# Patient Record
Sex: Female | Born: 1940 | Race: White | Hispanic: Yes | Marital: Married | Smoking: Never smoker
Health system: Southern US, Community
[De-identification: ages and names within clinical notes are randomized; demographics above are authoritative.]

## PROBLEM LIST (undated history)

## (undated) DIAGNOSIS — E119 Type 2 diabetes mellitus without complications: Secondary | ICD-10-CM

## (undated) DIAGNOSIS — I5042 Chronic combined systolic (congestive) and diastolic (congestive) heart failure: Secondary | ICD-10-CM

## (undated) DIAGNOSIS — N289 Disorder of kidney and ureter, unspecified: Secondary | ICD-10-CM

## (undated) DIAGNOSIS — I10 Essential (primary) hypertension: Secondary | ICD-10-CM

## (undated) DIAGNOSIS — D638 Anemia in other chronic diseases classified elsewhere: Secondary | ICD-10-CM

## (undated) DIAGNOSIS — I509 Heart failure, unspecified: Secondary | ICD-10-CM

## (undated) DIAGNOSIS — N184 Chronic kidney disease, stage 4 (severe): Secondary | ICD-10-CM

## (undated) DIAGNOSIS — H547 Unspecified visual loss: Secondary | ICD-10-CM

## (undated) HISTORY — PX: CHOLECYSTECTOMY: SHX55

## (undated) HISTORY — DX: Unspecified visual loss: H54.7

---

## 2007-11-02 ENCOUNTER — Ambulatory Visit: Payer: Self-pay | Admitting: Family Medicine

## 2008-07-07 ENCOUNTER — Emergency Department: Payer: Self-pay | Admitting: Emergency Medicine

## 2009-01-29 ENCOUNTER — Ambulatory Visit: Payer: Self-pay | Admitting: Unknown Physician Specialty

## 2009-02-09 ENCOUNTER — Ambulatory Visit: Payer: Self-pay | Admitting: Unknown Physician Specialty

## 2009-08-16 ENCOUNTER — Ambulatory Visit: Payer: Self-pay | Admitting: Family Medicine

## 2009-10-18 ENCOUNTER — Emergency Department: Payer: Self-pay | Admitting: Emergency Medicine

## 2009-10-18 ENCOUNTER — Other Ambulatory Visit: Payer: Self-pay | Admitting: Ophthalmology

## 2010-09-07 ENCOUNTER — Emergency Department: Payer: Self-pay | Admitting: Emergency Medicine

## 2010-10-07 ENCOUNTER — Ambulatory Visit: Payer: Self-pay | Admitting: Family Medicine

## 2012-01-26 ENCOUNTER — Ambulatory Visit: Payer: Self-pay | Admitting: Family Medicine

## 2012-08-30 ENCOUNTER — Ambulatory Visit: Payer: Self-pay | Admitting: Primary Care

## 2013-05-19 ENCOUNTER — Emergency Department: Payer: Self-pay | Admitting: Emergency Medicine

## 2013-05-19 LAB — BASIC METABOLIC PANEL
Anion Gap: 7 (ref 7–16)
BUN: 35 mg/dL — ABNORMAL HIGH (ref 7–18)
CHLORIDE: 111 mmol/L — AB (ref 98–107)
CREATININE: 1.57 mg/dL — AB (ref 0.60–1.30)
Calcium, Total: 9 mg/dL (ref 8.5–10.1)
Co2: 22 mmol/L (ref 21–32)
EGFR (African American): 38 — ABNORMAL LOW
GFR CALC NON AF AMER: 33 — AB
Glucose: 109 mg/dL — ABNORMAL HIGH (ref 65–99)
Osmolality: 288 (ref 275–301)
Potassium: 5.2 mmol/L — ABNORMAL HIGH (ref 3.5–5.1)
Sodium: 140 mmol/L (ref 136–145)

## 2013-05-19 LAB — CBC
HCT: 33 % — ABNORMAL LOW (ref 35.0–47.0)
HGB: 11 g/dL — ABNORMAL LOW (ref 12.0–16.0)
MCH: 31.9 pg (ref 26.0–34.0)
MCHC: 33.2 g/dL (ref 32.0–36.0)
MCV: 96 fL (ref 80–100)
PLATELETS: 257 10*3/uL (ref 150–440)
RBC: 3.44 10*6/uL — ABNORMAL LOW (ref 3.80–5.20)
RDW: 12.6 % (ref 11.5–14.5)
WBC: 7.2 10*3/uL (ref 3.6–11.0)

## 2013-05-19 LAB — TROPONIN I

## 2014-02-16 ENCOUNTER — Emergency Department: Payer: Self-pay | Admitting: Emergency Medicine

## 2015-10-15 ENCOUNTER — Emergency Department
Admission: EM | Admit: 2015-10-15 | Discharge: 2015-10-15 | Disposition: A | Discharge status: Home / Self Care | Payer: Medicare Other | Attending: Emergency Medicine | Admitting: Emergency Medicine

## 2015-10-15 ENCOUNTER — Encounter: Payer: Self-pay | Admitting: Emergency Medicine

## 2015-10-15 DIAGNOSIS — R21 Rash and other nonspecific skin eruption: Secondary | ICD-10-CM | POA: Diagnosis present

## 2015-10-15 DIAGNOSIS — E1122 Type 2 diabetes mellitus with diabetic chronic kidney disease: Secondary | ICD-10-CM | POA: Diagnosis not present

## 2015-10-15 DIAGNOSIS — N189 Chronic kidney disease, unspecified: Secondary | ICD-10-CM | POA: Diagnosis not present

## 2015-10-15 DIAGNOSIS — B353 Tinea pedis: Secondary | ICD-10-CM | POA: Diagnosis not present

## 2015-10-15 DIAGNOSIS — I129 Hypertensive chronic kidney disease with stage 1 through stage 4 chronic kidney disease, or unspecified chronic kidney disease: Secondary | ICD-10-CM | POA: Diagnosis not present

## 2015-10-15 HISTORY — DX: Disorder of kidney and ureter, unspecified: N28.9

## 2015-10-15 HISTORY — DX: Type 2 diabetes mellitus without complications: E11.9

## 2015-10-15 HISTORY — DX: Essential (primary) hypertension: I10

## 2015-10-15 MED ORDER — CLOTRIMAZOLE 1 % EX CREA: 1.0000 "application " | TOPICAL_CREAM | Freq: Two times a day (BID) | CUTANEOUS | 0 refills | Status: DC

## 2015-10-15 NOTE — ED Triage Notes (Signed)
Per interpreter pt with rash to bilateral feet for five mths. Has been seen for same and was given some cream with no relief. Sores noted to bilateral bottom of feet. Pt states is diabetic.

## 2015-10-15 NOTE — ED Notes (Addendum)
Pt family reports that she has "rashes" on the bottom of both feet - rash present for 4-5 months - the itching has gotten worse so she came to the er for eval - rashes is scaly mostly toward center of feet - pt request that her daughter interpret for her and declined hospital interpreter

## 2015-10-15 NOTE — ED Provider Notes (Signed)
[facility] Emergency Department Provider Note  Time seen: 11:49 AM  I have reviewed the triage vital signs and the nursing notes.   HISTORY  Chief Complaint Foot Ulcer    HPI Robin Rogers is a 75 y.o. female with a past medical history of diabetes, hypertension, CK D who presents to the emergency department with a foot rash. According to the patient for the past several months she has had a rash to her feet. She tried a nystatin cream but it did not work so she stopped using it. Sounds like she was not using it regularly. Patient states she has had increased itching of the bottom of her feet so she came into the emergency department today for evaluation. Denies any pain. Denies any fever or vomiting.  Past Medical History:  Diagnosis Date  . Diabetes mellitus without complication ([place])   . Hypertension   . Renal disorder     There are no active problems to display for this patient.   Past Surgical History:  Procedure Laterality Date  . CHOLECYSTECTOMY      Prior to Admission medications   Not on File    No Known Allergies  No family history on file.  Social History Social History  Substance Use Topics  . Smoking status: Never Smoker  . Smokeless tobacco: Never Used  . Alcohol use No    Review of Systems Constitutional: Negative for fever. Cardiovascular: Negative for chest pain. Respiratory: Negative for shortness of breath. Gastrointestinal: Negative for abdominal pain Skin: Rash/dry skin to bottom of feet and between toes. Neurological: Negative for headache 10-point ROS otherwise negative.  ____________________________________________   PHYSICAL EXAM:  VITAL SIGNS: ED Triage Vitals  Enc Vitals Group     BP 10/15/15 1000 (!) 156/53     Pulse Rate 10/15/15 1000 69     Resp 10/15/15 1000 18     Temp 10/15/15 1000 98.9 F (37.2 C)     Temp Source 10/15/15 1000 Oral     SpO2 10/15/15 1000 97 %     Weight 10/15/15 1001 180  lb (81.6 kg)     Height 10/15/15 1001 5\' 3"  (1.6 m)     Head Circumference --      Peak Flow --      Pain Score 10/15/15 1110 5     Pain Loc --      Pain Edu? --      Excl. in [place]? --     Constitutional: Alert and oriented. Well appearing and in no distress. Eyes: Normal exam ENT   Head: Normocephalic and atraumatic.   Mouth/Throat: Mucous membranes are moist. Cardiovascular: Normal rate, regular rhythm. No murmur Respiratory: Normal respiratory effort without tachypnea nor retractions. Breath sounds are clear  Gastrointestinal: Soft and nontender. No distention.   Musculoskeletal: Nontender with normal range of motion in all extremities. Patient has a scaly dry rash to the bottom of bilateral feet and between her toes bilaterally. With flaking skin. No erythema or edema. No weeping. Neurologic:  Normal speech and language. No gross focal neurologic deficit Skin:  Skin is warm, dry, rash as described above. Psychiatric: Mood and affect are normal.   ____________________________________________    INITIAL IMPRESSION / ASSESSMENT AND PLAN / ED COURSE  Pertinent labs & imaging results that were available during my care of the patient were reviewed by me and considered in my medical decision making (see chart for details).  Patient presents with a rash most consistent with  a fungal infection to bilateral plantar aspect of the feet. Patient states itching but denies any pain. No erythema or edema or weeping. Does not appear consistent with cellulitis. We will treat with a clotrimazole lotion, and have the patient follow up with dermatology.  ____________________________________________   FINAL CLINICAL IMPRESSION(S) / ED DIAGNOSES  Fungal rash    Harvest Dark, MD 10/15/15 1206

## 2015-10-30 MED ORDER — ACETAMINOPHEN 500 MG PO TABS
ORAL_TABLET | ORAL | Status: AC
  Filled 2015-10-30: qty 2

## 2016-11-14 ENCOUNTER — Other Ambulatory Visit: Payer: Self-pay | Admitting: Family Medicine

## 2016-11-14 DIAGNOSIS — Z1231 Encounter for screening mammogram for malignant neoplasm of breast: Secondary | ICD-10-CM

## 2016-11-14 DIAGNOSIS — Z13820 Encounter for screening for osteoporosis: Secondary | ICD-10-CM

## 2016-12-24 ENCOUNTER — Other Ambulatory Visit: Payer: Medicare Other

## 2017-03-01 ENCOUNTER — Emergency Department: Payer: Medicare Other

## 2017-03-01 ENCOUNTER — Other Ambulatory Visit: Payer: Self-pay

## 2017-03-01 ENCOUNTER — Encounter: Payer: Self-pay | Admitting: Emergency Medicine

## 2017-03-01 ENCOUNTER — Emergency Department
Admission: EM | Admit: 2017-03-01 | Discharge: 2017-03-01 | Disposition: A | Discharge status: Home / Self Care | Payer: Medicare Other | Attending: Emergency Medicine | Admitting: Emergency Medicine

## 2017-03-01 DIAGNOSIS — R1012 Left upper quadrant pain: Secondary | ICD-10-CM | POA: Diagnosis present

## 2017-03-01 DIAGNOSIS — E119 Type 2 diabetes mellitus without complications: Secondary | ICD-10-CM | POA: Diagnosis not present

## 2017-03-01 DIAGNOSIS — R0602 Shortness of breath: Secondary | ICD-10-CM | POA: Insufficient documentation

## 2017-03-01 DIAGNOSIS — I10 Essential (primary) hypertension: Secondary | ICD-10-CM | POA: Diagnosis not present

## 2017-03-01 DIAGNOSIS — R140 Abdominal distension (gaseous): Secondary | ICD-10-CM | POA: Insufficient documentation

## 2017-03-01 LAB — URINALYSIS, COMPLETE (UACMP) WITH MICROSCOPIC
BACTERIA UA: NONE SEEN
BILIRUBIN URINE: NEGATIVE
Glucose, UA: NEGATIVE mg/dL
Hgb urine dipstick: NEGATIVE
Ketones, ur: NEGATIVE mg/dL
LEUKOCYTES UA: NEGATIVE
Nitrite: NEGATIVE
PROTEIN: 100 mg/dL — AB
Specific Gravity, Urine: 1.008 (ref 1.005–1.030)
pH: 6 (ref 5.0–8.0)

## 2017-03-01 LAB — BASIC METABOLIC PANEL
ANION GAP: 9 (ref 5–15)
BUN: 33 mg/dL — ABNORMAL HIGH (ref 6–20)
CO2: 23 mmol/L (ref 22–32)
Calcium: 8.5 mg/dL — ABNORMAL LOW (ref 8.9–10.3)
Chloride: 106 mmol/L (ref 101–111)
Creatinine, Ser: 2.15 mg/dL — ABNORMAL HIGH (ref 0.44–1.00)
GFR calc Af Amer: 25 mL/min — ABNORMAL LOW (ref >60)
GFR, EST NON AFRICAN AMERICAN: 21 mL/min — AB (ref >60)
GLUCOSE: 140 mg/dL — AB (ref 65–99)
POTASSIUM: 4.6 mmol/L (ref 3.5–5.1)
Sodium: 138 mmol/L (ref 135–145)

## 2017-03-01 LAB — CBC
HEMATOCRIT: 28.6 % — AB (ref 35.0–47.0)
HEMOGLOBIN: 9.5 g/dL — AB (ref 12.0–16.0)
MCH: 31.7 pg (ref 26.0–34.0)
MCHC: 33.3 g/dL (ref 32.0–36.0)
MCV: 95.2 fL (ref 80.0–100.0)
Platelets: 273 10*3/uL (ref 150–440)
RBC: 3.01 MIL/uL — ABNORMAL LOW (ref 3.80–5.20)
RDW: 13.7 % (ref 11.5–14.5)
WBC: 7.7 10*3/uL (ref 3.6–11.0)

## 2017-03-01 NOTE — ED Notes (Signed)
Patient transported to CT 

## 2017-03-01 NOTE — Discharge Instructions (Signed)
You have been seen in the emergency department for abdominal pain.  Your workup including lab work and a CT scan of your abdomen showed no acute abnormalities.  Please follow-up with your primary care doctor in the next several days for recheck/reevaluation and possible referral to a GI medicine physician if you continue to have symptoms.  Return to the emergency department for any worsening pain, or any other symptom personally concerning to yourself.

## 2017-03-01 NOTE — ED Notes (Signed)
Discharge given - pt already dressed and standing waiting to leave. No longer on monitor.

## 2017-03-01 NOTE — ED Provider Notes (Signed)
[facility] Emergency Department Provider Note  Time seen: 6:58 PM  I have reviewed the triage vital signs and the nursing notes. Spanish interpreter used for this evaluation  HISTORY  Chief Complaint Shortness of Breath; Abdominal Pain; and Bloated    HPI Robin Rogers is a 77 y.o. female with a past medical history of diabetes, hypertension, CKD, presents to the emergency department for left-sided abdominal pain.  According to the patient for 1 year she has had some mild discomfort to her left abdomen, it has been much worse over the past 1 month and even more severe today which prompted today's emergency department visit.  Patient feels like there is something blocking her intestines on that side although denies any diarrhea, constipation, nausea or vomiting.  Patient denies any fever, cough or congestion.  Largely negative review of systems.  When asked what changed when she came to the emergency department today she states the pain increased.  Currently describes as moderate dull pain in the left side.  States it is worse at night especially when lying down, and occasionally causes her to feel short of breath when the pain gets severe.  Past Medical History:  Diagnosis Date  . Diabetes mellitus without complication ([place])   . Hypertension   . Renal disorder     There are no active problems to display for this patient.   Past Surgical History:  Procedure Laterality Date  . CHOLECYSTECTOMY      Prior to Admission medications   Medication Sig Start Date End Date Taking? Authorizing Provider  clotrimazole (LOTRIMIN) 1 % cream Apply 1 application topically 2 (two) times daily. 10/15/15   Harvest Dark, MD    No Known Allergies  History reviewed. No pertinent family history.  Social History Social History   Tobacco Use  . Smoking status: Never Smoker  . Smokeless tobacco: Never Used  Substance Use Topics  . Alcohol use: No  . Drug use: Not on  file    Review of Systems Constitutional: Negative for fever. Eyes: Negative for visual complaints ENT: Negative for recent illness/congestion Cardiovascular: Negative for chest pain. Respiratory: Negative for shortness of breath. Gastrointestinal: Left-sided abdominal pain.  Negative for nausea vomiting or diarrhea.  Negative constipation Genitourinary: Negative for dysuria. Musculoskeletal: Negative for musculoskeletal complaints Skin: Negative for skin complaints  Neurological: Negative for headache All other ROS negative  ____________________________________________   PHYSICAL EXAM:  VITAL SIGNS: ED Triage Vitals  Enc Vitals Group     BP 03/01/17 1621 (!) 157/57     Pulse Rate 03/01/17 1621 94     Resp 03/01/17 1621 20     Temp 03/01/17 1621 99 F (37.2 C)     Temp Source 03/01/17 1621 Oral     SpO2 03/01/17 1621 96 %     Weight 03/01/17 1624 180 lb (81.6 kg)     Height 03/01/17 1624 5\' 3"  (1.6 m)     Head Circumference --      Peak Flow --      Pain Score 03/01/17 1623 10     Pain Loc --      Pain Edu? --      Excl. in [place]? --    Constitutional: Alert and oriented. Well appearing and in no distress. Eyes: Normal exam ENT   Head: Normocephalic and atraumatic.   Mouth/Throat: Mucous membranes are moist. Cardiovascular: Normal rate, regular rhythm. No murmur Respiratory: Normal respiratory effort without tachypnea nor retractions. Breath sounds are clear  Gastrointestinal: Soft, moderate left mid abdominal tenderness to palpation.  No rebound or guarding.  No distention.  Abdomen otherwise benign. Musculoskeletal: Nontender with normal range of motion in all extremities.  Neurologic:  Normal speech and language. No gross focal neurologic deficits Skin:  Skin is warm, dry and intact.  Psychiatric: Mood and affect are normal.   ____________________________________________    EKG  EKG reviewed and interpreted by myself shows sinus rhythm at 94 bpm with a  narrow QRS, normal axis, normal intervals, nonspecific ST changes  ____________________________________________    RADIOLOGY  CT negative for acute abnormality  ____________________________________________   INITIAL IMPRESSION / ASSESSMENT AND PLAN / ED COURSE  Pertinent labs & imaging results that were available during my care of the patient were reviewed by me and considered in my medical decision making (see chart for details).  Patient presents to the emergency department with left-sided abdominal discomfort present for the past 1 year but worse over the past 1 month.  Differential would include colitis, diverticulitis, mass or tumor, urinary tract infection or pyelonephritis.  Patient's blood work is largely within normal limits with a normal white blood cell count.  Patient does have renal insufficiency, but it appears to be largely unchanged per record review.  Care everywhere shows a creatinine of 2.2 on 02/16/17.  Given the patient's ongoing discomfort will obtain a CT scan of the abdomen/pelvis to further evaluate and rule out intra-abdominal pathology.  Patient agreeable to this plan of care.  Urinalysis pending.  CT scan negative for acute abnormality.  Urinalysis pending.  If urinalysis is normal we will have the patient follow-up with her primary care doctor.  Urinalysis pending.  Patient care signed out to oncoming physician.  ____________________________________________   FINAL CLINICAL IMPRESSION(S) / ED DIAGNOSES  Left-sided abdominal pain    Harvest Dark, MD 03/01/17 2048

## 2017-03-01 NOTE — ED Triage Notes (Signed)
Pt reports that her LLQ has been hurting for "along time" She states that she feels that her abd feels bloated and she cant breath at night, but can during the day.

## 2017-07-20 ENCOUNTER — Other Ambulatory Visit: Payer: Self-pay

## 2017-07-20 ENCOUNTER — Emergency Department: Payer: Medicare Other

## 2017-07-20 ENCOUNTER — Encounter: Payer: Self-pay | Admitting: Emergency Medicine

## 2017-07-20 ENCOUNTER — Emergency Department
Admission: EM | Admit: 2017-07-20 | Discharge: 2017-07-21 | Discharge status: Against Medical Advice | Payer: Medicare Other | Attending: Emergency Medicine | Admitting: Emergency Medicine

## 2017-07-20 DIAGNOSIS — N189 Chronic kidney disease, unspecified: Secondary | ICD-10-CM

## 2017-07-20 DIAGNOSIS — Z79899 Other long term (current) drug therapy: Secondary | ICD-10-CM | POA: Diagnosis not present

## 2017-07-20 DIAGNOSIS — I129 Hypertensive chronic kidney disease with stage 1 through stage 4 chronic kidney disease, or unspecified chronic kidney disease: Secondary | ICD-10-CM | POA: Diagnosis not present

## 2017-07-20 DIAGNOSIS — R109 Unspecified abdominal pain: Secondary | ICD-10-CM | POA: Diagnosis present

## 2017-07-20 DIAGNOSIS — E1122 Type 2 diabetes mellitus with diabetic chronic kidney disease: Secondary | ICD-10-CM | POA: Diagnosis not present

## 2017-07-20 DIAGNOSIS — R1012 Left upper quadrant pain: Secondary | ICD-10-CM | POA: Diagnosis not present

## 2017-07-20 DIAGNOSIS — Z9049 Acquired absence of other specified parts of digestive tract: Secondary | ICD-10-CM | POA: Diagnosis not present

## 2017-07-20 LAB — COMPREHENSIVE METABOLIC PANEL
ALBUMIN: 3.5 g/dL (ref 3.5–5.0)
ALT: 14 U/L (ref 0–44)
ANION GAP: 9 (ref 5–15)
AST: 18 U/L (ref 15–41)
Alkaline Phosphatase: 127 U/L — ABNORMAL HIGH (ref 38–126)
BUN: 41 mg/dL — ABNORMAL HIGH (ref 8–23)
CHLORIDE: 105 mmol/L (ref 98–111)
CO2: 24 mmol/L (ref 22–32)
CREATININE: 2.26 mg/dL — AB (ref 0.44–1.00)
Calcium: 8.6 mg/dL — ABNORMAL LOW (ref 8.9–10.3)
GFR calc non Af Amer: 20 mL/min — ABNORMAL LOW (ref >60)
GFR, EST AFRICAN AMERICAN: 23 mL/min — AB (ref >60)
Glucose, Bld: 115 mg/dL — ABNORMAL HIGH (ref 70–99)
Potassium: 5.2 mmol/L — ABNORMAL HIGH (ref 3.5–5.1)
SODIUM: 138 mmol/L (ref 135–145)
Total Bilirubin: 0.3 mg/dL (ref 0.3–1.2)
Total Protein: 7.7 g/dL (ref 6.5–8.1)

## 2017-07-20 LAB — URINALYSIS, COMPLETE (UACMP) WITH MICROSCOPIC
Bilirubin Urine: NEGATIVE
GLUCOSE, UA: NEGATIVE mg/dL
Hgb urine dipstick: NEGATIVE
KETONES UR: NEGATIVE mg/dL
Leukocytes, UA: NEGATIVE
Nitrite: NEGATIVE
PH: 6 (ref 5.0–8.0)
Protein, ur: 100 mg/dL — AB
Specific Gravity, Urine: 1.006 (ref 1.005–1.030)

## 2017-07-20 LAB — CBC
HCT: 28.6 % — ABNORMAL LOW (ref 35.0–47.0)
HEMOGLOBIN: 9.7 g/dL — AB (ref 12.0–16.0)
MCH: 31.7 pg (ref 26.0–34.0)
MCHC: 33.8 g/dL (ref 32.0–36.0)
MCV: 93.9 fL (ref 80.0–100.0)
PLATELETS: 286 10*3/uL (ref 150–440)
RBC: 3.05 MIL/uL — AB (ref 3.80–5.20)
RDW: 13.7 % (ref 11.5–14.5)
WBC: 9 10*3/uL (ref 3.6–11.0)

## 2017-07-20 LAB — LIPASE, BLOOD: LIPASE: 29 U/L (ref 11–51)

## 2017-07-20 MED ORDER — SODIUM CHLORIDE 0.9 % IV BOLUS: 1000.0000 mL | Freq: Once | INTRAVENOUS | Status: DC

## 2017-07-20 NOTE — ED Triage Notes (Signed)
abd pain and no BM x 3 days.

## 2017-07-20 NOTE — ED Notes (Signed)
Upon going in to start IV fluids, pt and family express their need to get home, states they wish to follow up w/ Nephrologist rather than staying for fluids.  EDP made aware.  AMA paperwork explained and signed.

## 2017-07-20 NOTE — ED Notes (Signed)
Patient transported to CT 

## 2017-07-20 NOTE — ED Notes (Signed)
Translator requested.

## 2017-07-20 NOTE — ED Provider Notes (Signed)
[facility] Emergency Department Provider Note  ___________________________________________   None    (approximate)  I have reviewed the triage vital signs and the nursing notes.   HISTORY  Chief Complaint Abdominal Pain   HPI Robin Rogers is a 77 y.o. female who presents to the emergency department for treatment and evaluation of abdominal pain.  Patient has not had a bowel movement for the past 3 days, but states while she was waiting to come back for evaluation she did have a bowel movement and now feels better.  She has a history of type 2 diabetes, chronic renal failure, and hypertension.  No alleviating measures were attempted prior to arrival.  Past Medical History:  Diagnosis Date  . Diabetes mellitus without complication (Robin Rogers)   . Hypertension   . Renal disorder     There are no active problems to display for this patient.   Past Surgical History:  Procedure Laterality Date  . CHOLECYSTECTOMY      Prior to Admission medications   Medication Sig Start Date End Date Taking? Authorizing Provider  amLODipine (NORVASC) 10 MG tablet Take 1 tablet by mouth daily. 02/27/17   [provider]  clotrimazole (LOTRIMIN) 1 % cream Apply 1 application topically 2 (two) times daily. Patient not taking: Reported on 03/01/2017 10/15/15   Harvest Dark, MD  lovastatin (MEVACOR) 40 MG tablet Take 1 tablet by mouth daily. 01/21/17   [provider]  metoprolol tartrate (LOPRESSOR) 25 MG tablet Take 1 tablet by mouth 2 (two) times daily. 12/02/16   [provider]  Vitamin D, Ergocalciferol, (DRISDOL) 50000 units CAPS capsule Take 50,000 Units by mouth every 7 (seven) days.    [provider]    Allergies Patient has no known allergies.  No family history on file.  Social History Social History   Tobacco Use  . Smoking status: Never Smoker  . Smokeless tobacco: Never Used  Substance Use Topics  . Alcohol use:  No  . Drug use: Not on file    Review of Systems  Constitutional: No fever/chills Eyes: No visual changes. ENT: No sore throat. Cardiovascular: Denies chest pain. Respiratory: Denies shortness of breath. Gastrointestinal: Positive for crampy abdominal pain.  No nausea, no vomiting.  No diarrhea.  Positive for constipation. Genitourinary: Negative for dysuria. Musculoskeletal: Negative for back pain. Skin: Negative for rash. Neurological: Negative for headaches, focal weakness or numbness. ____________________________________________   PHYSICAL EXAM:  VITAL SIGNS: ED Triage Vitals  Enc Vitals Group     BP 07/20/17 1804 (!) 144/52     Pulse Rate 07/20/17 1804 76     Resp 07/20/17 1804 18     Temp 07/20/17 1804 99.1 F (37.3 C)     Temp Source 07/20/17 1804 Oral     SpO2 07/20/17 1804 99 %     Weight 07/20/17 1806 170 lb (77.1 kg)     Height 07/20/17 1806 5\' 2"  (1.575 m)     Head Circumference --      Peak Flow --      Pain Score 07/20/17 1806 10     Pain Loc --      Pain Edu? --      Excl. in [place]? --     Constitutional: Alert and oriented. Well appearing and in no acute distress. Eyes: Conjunctivae are baseline per daughter. Head: Atraumatic. Nose: No congestion/rhinnorhea. Mouth/Throat: Mucous membranes are moist.  Neck: No stridor.   Cardiovascular: Normal rate, regular rhythm. Grossly normal  heart sounds.  Good peripheral circulation. Respiratory: Normal respiratory effort.  No retractions. Lungs CTAB. Gastrointestinal: Soft and diffusely tender. No distention. No abdominal bruits. No CVA tenderness.  Bowel sounds present x4 quadrants. Musculoskeletal: No lower extremity tenderness nor edema.  No joint effusions. Neurologic:  Normal speech and language. No gross focal neurologic deficits are appreciated. No gait instability. Skin:  Skin is warm, dry and intact. No rash noted. Psychiatric: Mood and affect are normal. Speech and behavior are  normal.  ____________________________________________   LABS (all labs ordered are listed, but only abnormal results are displayed)  Labs Reviewed  COMPREHENSIVE METABOLIC PANEL - Abnormal; Notable for the following components:      Result Value   Potassium 5.2 (*)    Glucose, Bld 115 (*)    BUN 41 (*)    Creatinine, Ser 2.26 (*)    Calcium 8.6 (*)    Alkaline Phosphatase 127 (*)    GFR calc non Af Amer 20 (*)    GFR calc Af Amer 23 (*)    All other components within normal limits  CBC - Abnormal; Notable for the following components:   RBC 3.05 (*)    Hemoglobin 9.7 (*)    HCT 28.6 (*)    All other components within normal limits  URINALYSIS, COMPLETE (UACMP) WITH MICROSCOPIC - Abnormal; Notable for the following components:   Color, Urine STRAW (*)    APPearance CLEAR (*)    Protein, ur 100 (*)    Bacteria, UA RARE (*)    All other components within normal limits  LIPASE, BLOOD   ____________________________________________  EKG   ____________________________________________  RADIOLOGY  ED MD interpretation: Borderline prominence of stool in the colon.   Official radiology report(s): Ct Abdomen Pelvis Wo Contrast  Result Date: 07/20/2017 CLINICAL DATA:  Abdominal pain and constipation for 3 days. EXAM: CT ABDOMEN AND PELVIS WITHOUT CONTRAST TECHNIQUE: Multidetector CT imaging of the abdomen and pelvis was performed following the standard protocol without IV contrast. COMPARISON:  03/01/2017 FINDINGS: Lower chest: Small calcified nodule in the right lower lobe, likely a granuloma. There is also a calcified pleural plaque in the right lower lobe. Moderate to prominent cardiomegaly. Lower thoracic aortic atherosclerotic calcification. Small type 1 hiatal hernia. Hepatobiliary: Cholecystectomy.  Otherwise unremarkable. Pancreas: Unremarkable Spleen: Unremarkable Adrenals/Urinary Tract: 0.6 by 0.4 cm parenchymal hyperdensity anteriorly in the left mid upper kidney, probably  a mildly complex cyst but technically nonspecific, best appreciated on image 63/5. No urinary tract calculi. Stomach/Bowel: Other redundant sigmoid colon. Borderline prominence of stool in the colon. No dilated small bowel. Appendix normal. Low position of the anorectal junction suggesting pelvic floor laxity. Vascular/Lymphatic: Aortoiliac atherosclerotic vascular disease. Reproductive: Unremarkable aside from faint parametrial calcifications along the margin of the uterus. Other: No supplemental non-categorized findings. Musculoskeletal: Lower lumbar spondylosis and degenerative disc disease leading to mild left foraminal stenosis at L5-S1. Mild levoconvex lumbar scoliosis. IMPRESSION: 1. Pelvic floor laxity with low position of the anorectal junction. 2. Borderline prominence of stool in the colon, potentially from mild constipation. 3. Calcified pleural plaque in the right lower lobe possibly from remote inflammation or old asbestos exposure. 4. Moderate to prominent cardiomegaly. 5.  Aortic Atherosclerosis (ICD10-I70.0). 6. Old granulomatous disease 7. Small type 1 hiatal hernia. 8. Mild left foraminal impingement at L5-S1. Electronically Signed   By: Van Clines M.D.   On: 07/20/2017 22:13    ____________________________________________   PROCEDURES  Procedure(s) performed: None  Procedures  Critical Care performed: No  ____________________________________________   INITIAL IMPRESSION / ASSESSMENT AND PLAN / ED COURSE  As part of my medical decision making, I reviewed the following data within the electronic MEDICAL RECORD NUMBER    77 year old female presenting to the emergency department for treatment and evaluation of abdominal pain.  While in the waiting room, she had a bowel movement which provided her with some relief of the pain.  I had requested that the patient stay for IV fluids as her electrolytes and renal function are mildly abnormal and felt that she would benefit greatly  from the rehydration, however she refused to stay despite being advised of the rationale.  She is to call and schedule an appointment with her nephrologist so she was advised to call tomorrow.  She is to return to the emergency department for symptoms of change or worsen if unable to schedule an appointment with primary care or her specialist.      ____________________________________________   FINAL CLINICAL IMPRESSION(S) / ED DIAGNOSES  Final diagnoses:  Left upper quadrant pain  Chronic kidney disease, unspecified CKD stage     ED Discharge Orders    None       Note:  This document was prepared using Dragon voice recognition software and may include unintentional dictation errors.    Victorino Dike, FNP 07/21/17 0203    Loney Hering, MD 07/21/17 [id number]

## 2017-09-22 ENCOUNTER — Encounter: Payer: Self-pay | Admitting: Emergency Medicine

## 2017-09-22 ENCOUNTER — Inpatient Hospital Stay
Admission: EM | Admit: 2017-09-22 | Discharge: 2017-09-25 | DRG: 391 | Disposition: A | Discharge status: Home / Self Care | Payer: Medicare Other | Attending: Internal Medicine | Admitting: Internal Medicine

## 2017-09-22 ENCOUNTER — Emergency Department: Payer: Medicare Other

## 2017-09-22 ENCOUNTER — Other Ambulatory Visit: Payer: Self-pay

## 2017-09-22 DIAGNOSIS — E875 Hyperkalemia: Secondary | ICD-10-CM | POA: Diagnosis not present

## 2017-09-22 DIAGNOSIS — K219 Gastro-esophageal reflux disease without esophagitis: Secondary | ICD-10-CM | POA: Diagnosis present

## 2017-09-22 DIAGNOSIS — I130 Hypertensive heart and chronic kidney disease with heart failure and stage 1 through stage 4 chronic kidney disease, or unspecified chronic kidney disease: Secondary | ICD-10-CM | POA: Diagnosis present

## 2017-09-22 DIAGNOSIS — K449 Diaphragmatic hernia without obstruction or gangrene: Secondary | ICD-10-CM | POA: Diagnosis present

## 2017-09-22 DIAGNOSIS — E1143 Type 2 diabetes mellitus with diabetic autonomic (poly)neuropathy: Secondary | ICD-10-CM | POA: Diagnosis present

## 2017-09-22 DIAGNOSIS — K529 Noninfective gastroenteritis and colitis, unspecified: Principal | ICD-10-CM | POA: Diagnosis present

## 2017-09-22 DIAGNOSIS — E1122 Type 2 diabetes mellitus with diabetic chronic kidney disease: Secondary | ICD-10-CM | POA: Diagnosis present

## 2017-09-22 DIAGNOSIS — K2970 Gastritis, unspecified, without bleeding: Secondary | ICD-10-CM | POA: Diagnosis present

## 2017-09-22 DIAGNOSIS — Z79899 Other long term (current) drug therapy: Secondary | ICD-10-CM

## 2017-09-22 DIAGNOSIS — Z7982 Long term (current) use of aspirin: Secondary | ICD-10-CM

## 2017-09-22 DIAGNOSIS — J9601 Acute respiratory failure with hypoxia: Secondary | ICD-10-CM | POA: Diagnosis not present

## 2017-09-22 DIAGNOSIS — N183 Chronic kidney disease, stage 3 (moderate): Secondary | ICD-10-CM | POA: Diagnosis present

## 2017-09-22 DIAGNOSIS — I509 Heart failure, unspecified: Secondary | ICD-10-CM

## 2017-09-22 DIAGNOSIS — R1013 Epigastric pain: Secondary | ICD-10-CM | POA: Diagnosis not present

## 2017-09-22 DIAGNOSIS — I5043 Acute on chronic combined systolic (congestive) and diastolic (congestive) heart failure: Secondary | ICD-10-CM | POA: Diagnosis not present

## 2017-09-22 DIAGNOSIS — R112 Nausea with vomiting, unspecified: Secondary | ICD-10-CM

## 2017-09-22 DIAGNOSIS — Z9049 Acquired absence of other specified parts of digestive tract: Secondary | ICD-10-CM

## 2017-09-22 DIAGNOSIS — D631 Anemia in chronic kidney disease: Secondary | ICD-10-CM | POA: Diagnosis present

## 2017-09-22 DIAGNOSIS — Z23 Encounter for immunization: Secondary | ICD-10-CM

## 2017-09-22 DIAGNOSIS — K5900 Constipation, unspecified: Secondary | ICD-10-CM | POA: Diagnosis present

## 2017-09-22 DIAGNOSIS — K3184 Gastroparesis: Secondary | ICD-10-CM | POA: Diagnosis present

## 2017-09-22 DIAGNOSIS — H548 Legal blindness, as defined in USA: Secondary | ICD-10-CM | POA: Diagnosis present

## 2017-09-22 DIAGNOSIS — R109 Unspecified abdominal pain: Secondary | ICD-10-CM

## 2017-09-22 DIAGNOSIS — E785 Hyperlipidemia, unspecified: Secondary | ICD-10-CM | POA: Diagnosis present

## 2017-09-22 HISTORY — DX: Heart failure, unspecified: I50.9

## 2017-09-22 LAB — URINALYSIS, COMPLETE (UACMP) WITH MICROSCOPIC
Bacteria, UA: NONE SEEN
Bilirubin Urine: NEGATIVE
GLUCOSE, UA: NEGATIVE mg/dL
KETONES UR: NEGATIVE mg/dL
Leukocytes, UA: NEGATIVE
NITRITE: NEGATIVE
Protein, ur: 100 mg/dL — AB
Specific Gravity, Urine: 1.004 — ABNORMAL LOW (ref 1.005–1.030)
pH: 5 (ref 5.0–8.0)

## 2017-09-22 LAB — BASIC METABOLIC PANEL
ANION GAP: 12 (ref 5–15)
BUN: 44 mg/dL — ABNORMAL HIGH (ref 8–23)
CO2: 20 mmol/L — AB (ref 22–32)
Calcium: 8.5 mg/dL — ABNORMAL LOW (ref 8.9–10.3)
Chloride: 103 mmol/L (ref 98–111)
Creatinine, Ser: 2.13 mg/dL — ABNORMAL HIGH (ref 0.44–1.00)
GFR calc non Af Amer: 22 mL/min — ABNORMAL LOW (ref >60)
GFR, EST AFRICAN AMERICAN: 25 mL/min — AB (ref >60)
GLUCOSE: 99 mg/dL (ref 70–99)
Potassium: 4.9 mmol/L (ref 3.5–5.1)
Sodium: 135 mmol/L (ref 135–145)

## 2017-09-22 LAB — HEPATIC FUNCTION PANEL
ALT: 17 U/L (ref 0–44)
AST: 24 U/L (ref 15–41)
Albumin: 3.4 g/dL — ABNORMAL LOW (ref 3.5–5.0)
Alkaline Phosphatase: 107 U/L (ref 38–126)
TOTAL PROTEIN: 7.6 g/dL (ref 6.5–8.1)
Total Bilirubin: 0.4 mg/dL (ref 0.3–1.2)

## 2017-09-22 LAB — CBC
HCT: 25.6 % — ABNORMAL LOW (ref 35.0–47.0)
HEMOGLOBIN: 8.9 g/dL — AB (ref 12.0–16.0)
MCH: 33.3 pg (ref 26.0–34.0)
MCHC: 34.8 g/dL (ref 32.0–36.0)
MCV: 95.7 fL (ref 80.0–100.0)
PLATELETS: 277 10*3/uL (ref 150–440)
RBC: 2.67 MIL/uL — AB (ref 3.80–5.20)
RDW: 13.3 % (ref 11.5–14.5)
WBC: 6.6 10*3/uL (ref 3.6–11.0)

## 2017-09-22 LAB — BRAIN NATRIURETIC PEPTIDE: B Natriuretic Peptide: 457 pg/mL — ABNORMAL HIGH (ref 0.0–100.0)

## 2017-09-22 LAB — LIPASE, BLOOD: Lipase: 23 U/L (ref 11–51)

## 2017-09-22 LAB — TROPONIN I

## 2017-09-22 MED ORDER — FAMOTIDINE 20 MG PO TABS
20.0000 mg | ORAL_TABLET | Freq: Once | ORAL | Status: AC
  Administered 2017-09-22: 20 mg via ORAL
  Filled 2017-09-22: qty 1

## 2017-09-22 MED ORDER — METOPROLOL TARTRATE 25 MG PO TABS
25.0000 mg | ORAL_TABLET | Freq: Two times a day (BID) | ORAL | Status: DC
  Administered 2017-09-23 – 2017-09-25 (×5): 25 mg via ORAL
  Filled 2017-09-22 (×5): qty 1

## 2017-09-22 MED ORDER — SODIUM CHLORIDE 0.9 % IV BOLUS
500.0000 mL | Freq: Once | INTRAVENOUS | Status: AC
  Administered 2017-09-22: 500 mL via INTRAVENOUS

## 2017-09-22 MED ORDER — INSULIN ASPART 100 UNIT/ML ~~LOC~~ SOLN: 0.0000 [IU] | Freq: Every day | SUBCUTANEOUS | Status: DC

## 2017-09-22 MED ORDER — VITAMIN D (ERGOCALCIFEROL) 1.25 MG (50000 UNIT) PO CAPS
50000.0000 [IU] | ORAL_CAPSULE | ORAL | Status: DC
  Administered 2017-09-23: 09:00:00 50000 [IU] via ORAL
  Filled 2017-09-22: qty 1

## 2017-09-22 MED ORDER — FERROUS SULFATE 325 (65 FE) MG PO TABS
325.0000 mg | ORAL_TABLET | Freq: Two times a day (BID) | ORAL | Status: DC
  Administered 2017-09-23 – 2017-09-25 (×5): 325 mg via ORAL
  Filled 2017-09-22 (×5): qty 1

## 2017-09-22 MED ORDER — MORPHINE SULFATE (PF) 4 MG/ML IV SOLN
4.0000 mg | Freq: Once | INTRAVENOUS | Status: AC
  Administered 2017-09-22: 4 mg via INTRAVENOUS

## 2017-09-22 MED ORDER — ASPIRIN EC 81 MG PO TBEC
81.0000 mg | DELAYED_RELEASE_TABLET | Freq: Every day | ORAL | Status: DC
  Administered 2017-09-23 – 2017-09-25 (×3): 81 mg via ORAL
  Filled 2017-09-22 (×3): qty 1

## 2017-09-22 MED ORDER — ACETAMINOPHEN 650 MG RE SUPP: 650.0000 mg | Freq: Four times a day (QID) | RECTAL | Status: DC | PRN

## 2017-09-22 MED ORDER — ONDANSETRON HCL 4 MG/2ML IJ SOLN
4.0000 mg | Freq: Once | INTRAMUSCULAR | Status: AC
  Administered 2017-09-22: 4 mg via INTRAVENOUS

## 2017-09-22 MED ORDER — ACETAMINOPHEN 325 MG PO TABS
650.0000 mg | ORAL_TABLET | Freq: Four times a day (QID) | ORAL | Status: DC | PRN
  Administered 2017-09-23: 650 mg via ORAL
  Filled 2017-09-22: qty 2

## 2017-09-22 MED ORDER — MORPHINE SULFATE (PF) 4 MG/ML IV SOLN
4.0000 mg | Freq: Once | INTRAVENOUS | Status: AC
  Administered 2017-09-22: 4 mg via INTRAVENOUS
  Filled 2017-09-22: qty 1

## 2017-09-22 MED ORDER — PANTOPRAZOLE SODIUM 40 MG PO TBEC
40.0000 mg | DELAYED_RELEASE_TABLET | Freq: Every day | ORAL | Status: DC
  Administered 2017-09-23 – 2017-09-25 (×3): 40 mg via ORAL
  Filled 2017-09-22 (×3): qty 1

## 2017-09-22 MED ORDER — INSULIN ASPART 100 UNIT/ML ~~LOC~~ SOLN
0.0000 [IU] | Freq: Three times a day (TID) | SUBCUTANEOUS | Status: DC
  Administered 2017-09-24 – 2017-09-25 (×2): 1 [IU] via SUBCUTANEOUS
  Filled 2017-09-22 (×2): qty 1

## 2017-09-22 MED ORDER — AMLODIPINE BESYLATE 10 MG PO TABS
10.0000 mg | ORAL_TABLET | Freq: Every day | ORAL | Status: DC
  Administered 2017-09-23 – 2017-09-25 (×3): 10 mg via ORAL
  Filled 2017-09-22 (×3): qty 1

## 2017-09-22 MED ORDER — PRAVASTATIN SODIUM 20 MG PO TABS
40.0000 mg | ORAL_TABLET | Freq: Every day | ORAL | Status: DC
  Administered 2017-09-23 – 2017-09-24 (×2): 40 mg via ORAL
  Filled 2017-09-22 (×2): qty 2

## 2017-09-22 MED ORDER — ONDANSETRON HCL 4 MG/2ML IJ SOLN
INTRAMUSCULAR | Status: AC
  Filled 2017-09-22: qty 2

## 2017-09-22 MED ORDER — SODIUM CHLORIDE 0.9 % IV SOLN
INTRAVENOUS | Status: DC
  Administered 2017-09-23 (×2): via INTRAVENOUS

## 2017-09-22 MED ORDER — ONDANSETRON HCL 4 MG/2ML IJ SOLN: 4.0000 mg | Freq: Four times a day (QID) | INTRAMUSCULAR | Status: DC | PRN

## 2017-09-22 MED ORDER — FAMOTIDINE IN NACL 20-0.9 MG/50ML-% IV SOLN
20.0000 mg | Freq: Two times a day (BID) | INTRAVENOUS | Status: DC
  Administered 2017-09-23 (×2): 20 mg via INTRAVENOUS
  Filled 2017-09-22 (×2): qty 50

## 2017-09-22 MED ORDER — HEPARIN SODIUM (PORCINE) 5000 UNIT/ML IJ SOLN
5000.0000 [IU] | Freq: Three times a day (TID) | INTRAMUSCULAR | Status: DC
  Administered 2017-09-23 – 2017-09-25 (×7): 5000 [IU] via SUBCUTANEOUS
  Filled 2017-09-22 (×7): qty 1

## 2017-09-22 MED ORDER — ONDANSETRON HCL 4 MG PO TABS: 4.0000 mg | ORAL_TABLET | Freq: Four times a day (QID) | ORAL | Status: DC | PRN

## 2017-09-22 MED ORDER — MORPHINE SULFATE (PF) 4 MG/ML IV SOLN
INTRAVENOUS | Status: AC
  Administered 2017-09-22: 4 mg via INTRAVENOUS
  Filled 2017-09-22: qty 1

## 2017-09-22 MED ORDER — ALUM & MAG HYDROXIDE-SIMETH 200-200-20 MG/5ML PO SUSP
15.0000 mL | Freq: Once | ORAL | Status: AC
  Administered 2017-09-22: 15 mL via ORAL
  Filled 2017-09-22: qty 30

## 2017-09-22 MED ORDER — POLYVINYL ALCOHOL 1.4 % OP SOLN
1.0000 [drp] | Freq: Four times a day (QID) | OPHTHALMIC | Status: DC
  Administered 2017-09-23 – 2017-09-25 (×9): 1 [drp] via OPHTHALMIC
  Filled 2017-09-22: qty 15

## 2017-09-22 MED ORDER — PNEUMOCOCCAL VAC POLYVALENT 25 MCG/0.5ML IJ INJ: 0.5000 mL | INJECTION | INTRAMUSCULAR | Status: DC

## 2017-09-22 MED ORDER — ONDANSETRON HCL 4 MG/2ML IJ SOLN
INTRAMUSCULAR | Status: AC
  Administered 2017-09-22: 4 mg via INTRAVENOUS
  Filled 2017-09-22: qty 2

## 2017-09-22 MED ORDER — METOCLOPRAMIDE HCL 5 MG/ML IJ SOLN
5.0000 mg | Freq: Four times a day (QID) | INTRAMUSCULAR | Status: DC
  Administered 2017-09-23 – 2017-09-25 (×10): 5 mg via INTRAVENOUS
  Filled 2017-09-22 (×10): qty 2

## 2017-09-22 MED ORDER — INFLUENZA VAC SPLIT HIGH-DOSE 0.5 ML IM SUSY
0.5000 mL | PREFILLED_SYRINGE | INTRAMUSCULAR | Status: DC
  Filled 2017-09-22: qty 0.5

## 2017-09-22 NOTE — H&P (Addendum)
[facility] [facility] NAME: Robin Rogers    MR#:  [id number]  DATE OF BIRTH:  1940-02-08  DATE OF ADMISSION:  09/22/2017  PRIMARY CARE PHYSICIAN: [facility] [place]   REQUESTING/REFERRING PHYSICIAN: Dr. Cherylann Banas  CHIEF COMPLAINT:   Chief Complaint  Patient presents with  . Chest Pain  . Shortness of Breath    HISTORY OF PRESENT ILLNESS:  Robin Rogers  is a 77 y.o. female with a known history of hypertension, diabetes, chronic kidney disease stage III who presents to the hospital due to abdominal pain.  Patient says she has been having abdominal pain now for the past few weeks but it was much worse today and she became short of breath with the pain and therefore was concerned and came to the ER for further evaluation.  In the ER patient received some IV morphine for the pain and then developed intractable nausea vomiting over the past few hours and has not been able to keep even crackers or ginger ale down.  Patient underwent ultrasound of her right upper quadrant along with a CT scan of the abdomen pelvis which is negative for acute pathology but continues to have intractable nausea vomiting and therefore hospitalist services were contacted for admission.  Patient denies any acute chest pain, fever, chills, cough, congestion, melena, hematochezia or any other associated symptoms presently.  PAST MEDICAL HISTORY:   Past Medical History:  Diagnosis Date  . Diabetes mellitus without complication ([place])   . Hypertension   . Renal disorder     PAST SURGICAL HISTORY:   Past Surgical History:  Procedure Laterality Date  . CHOLECYSTECTOMY      SOCIAL HISTORY:   Social History   Tobacco Use  . Smoking status: Never Smoker  . Smokeless tobacco: Never Used  Substance Use Topics  . Alcohol use: No    FAMILY HISTORY:  No family history on file.  DRUG ALLERGIES:  No Known Allergies  REVIEW OF SYSTEMS:   Review of  Systems  Constitutional: Negative for fever and weight loss.  HENT: Negative for congestion, nosebleeds and tinnitus.   Eyes: Negative for blurred vision, double vision and redness.  Respiratory: Negative for cough, hemoptysis and shortness of breath.   Cardiovascular: Negative for chest pain, orthopnea, leg swelling and PND.  Gastrointestinal: Positive for abdominal pain, nausea and vomiting. Negative for diarrhea and melena.  Genitourinary: Negative for dysuria, hematuria and urgency.  Musculoskeletal: Negative for falls and joint pain.  Neurological: Negative for dizziness, tingling, sensory change, focal weakness, seizures, weakness and headaches.  Endo/Heme/Allergies: Negative for polydipsia. Does not bruise/bleed easily.  Psychiatric/Behavioral: Negative for depression and memory loss. The patient is not nervous/anxious.     MEDICATIONS AT HOME:   Prior to Admission medications   Medication Sig Start Date End Date Taking? Authorizing Provider  amLODipine (NORVASC) 10 MG tablet Take 1 tablet by mouth daily. 02/27/17   [provider]  clotrimazole (LOTRIMIN) 1 % cream Apply 1 application topically 2 (two) times daily. Patient not taking: Reported on 03/01/2017 10/15/15   Harvest Dark, MD  lovastatin (MEVACOR) 40 MG tablet Take 1 tablet by mouth daily. 01/21/17   [provider]  metoprolol tartrate (LOPRESSOR) 25 MG tablet Take 1 tablet by mouth 2 (two) times daily. 12/02/16   [provider]  Vitamin D, Ergocalciferol, (DRISDOL) 50000 units CAPS capsule Take 50,000 Units by mouth every 7 (seven) days.    [provider]  VITAL SIGNS:  Blood pressure (!) 153/71, pulse 84, resp. rate 14, height 5' (1.524 m), weight 83 kg, SpO2 98 %.  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  EYES: legally blind b/l HEENT: Head atraumatic, normocephalic. Oropharynx and nasopharynx clear. No  oropharyngeal erythema, moist oral mucosa  NECK:  Supple, no jugular venous distention. No thyroid enlargement, no tenderness.  LUNGS: Normal breath sounds bilaterally, no wheezing, rales, rhonchi. No use of accessory muscles of respiration.  CARDIOVASCULAR: S1, S2 RRR. No murmurs, rubs, gallops, clicks.  ABDOMEN: Soft, Tender in Epigastric area, no rebound, rigidity, nondistended. Bowel sounds present. No organomegaly or mass.  EXTREMITIES: No pedal edema, cyanosis, or clubbing. + 2 pedal & radial pulses b/l.   NEUROLOGIC: Cranial nerves II through XII are intact. No focal Motor or sensory deficits appreciated b/l PSYCHIATRIC: The patient is alert and oriented x 3. SKIN: No obvious rash, lesion, or ulcer.   LABORATORY PANEL:   CBC Recent Labs  Lab 09/22/17 1122  WBC 6.6  HGB 8.9*  HCT 25.6*  PLT 277   ------------------------------------------------------------------------------------------------------------------  Chemistries  Recent Labs  Lab 09/22/17 1122  NA 135  K 4.9  CL 103  CO2 20*  GLUCOSE 99  BUN 44*  CREATININE 2.13*  CALCIUM 8.5*  AST 24  ALT 17  ALKPHOS 107  BILITOT 0.4   ------------------------------------------------------------------------------------------------------------------  Cardiac Enzymes Recent Labs  Lab 09/22/17 1122  TROPONINI <0.03   ------------------------------------------------------------------------------------------------------------------  RADIOLOGY:  Ct Abdomen Pelvis Wo Contrast  Result Date: 09/22/2017 CLINICAL DATA:  Acute upper abdominal pain. EXAM: CT ABDOMEN AND PELVIS WITHOUT CONTRAST TECHNIQUE: Multidetector CT imaging of the abdomen and pelvis was performed following the standard protocol without IV contrast. COMPARISON:  CT scan July 20, 2017. FINDINGS: Lower chest: No acute abnormality. Hepatobiliary: No focal liver abnormality is seen. Status post cholecystectomy. No biliary dilatation. Pancreas: Unremarkable. No  pancreatic ductal dilatation or surrounding inflammatory changes. Spleen: Normal in size without focal abnormality. Adrenals/Urinary Tract: Adrenal glands are unremarkable. Kidneys are normal, without renal calculi, focal lesion, or hydronephrosis. Bladder is unremarkable. Stomach/Bowel: Stomach is within normal limits. Appendix appears normal. No evidence of bowel wall thickening, distention, or inflammatory changes. Vascular/Lymphatic: Aortic atherosclerosis. No enlarged abdominal or pelvic lymph nodes. Reproductive: Uterus and bilateral adnexa are unremarkable. Other: No abdominal wall hernia or abnormality. No abdominopelvic ascites. Musculoskeletal: No acute or significant osseous findings. IMPRESSION: No acute abnormality seen in the abdomen or pelvis. Aortic Atherosclerosis (ICD10-I70.0). Electronically Signed   By: Marijo Conception, M.D.   On: 09/22/2017 13:02   Dg Chest 2 View  Result Date: 09/22/2017 CLINICAL DATA:  Chest pain EXAM: CHEST - 2 VIEW COMPARISON:  05/19/2013 FINDINGS: Cardiomegaly with vascular congestion and possible early interstitial edema. Right base atelectasis and small right effusion. No acute bony abnormality. IMPRESSION: Cardiomegaly with vascular congestion and possible early interstitial edema. Right base atelectasis and small right effusion. Electronically Signed   By: Rolm Baptise M.D.   On: 09/22/2017 11:34   US Abdomen Limited Ruq  Result Date: 09/22/2017 CLINICAL DATA:  Abdominal pain EXAM: ULTRASOUND ABDOMEN LIMITED RIGHT UPPER QUADRANT COMPARISON:  CT abdomen and pelvis September 22, 2017 FINDINGS: Gallbladder: Surgically absent. Common bile duct: Diameter: 6 mm. No intrahepatic or extrahepatic biliary duct dilatation. Liver: No focal lesion identified. Within normal limits in parenchymal echogenicity. Portal vein is patent on color Doppler imaging with normal direction of blood flow towards the liver. Right kidney  appears echogenic. IMPRESSION: Gallbladder absent.  Echogenic right kidney. Question a degree of underlying medical renal disease. Study otherwise unremarkable. Electronically Signed   By: Lowella Grip III M.D.   On: 09/22/2017 13:46     IMPRESSION AND PLAN:   77 year old female with past medical history of diabetes, chronic kidney disease stage III, hypertension who presents to the hospital due to abdominal pain and also noted to have intractable nausea vomiting.  1.  Abdominal pain/intractable nausea vomiting-etiology unclear but suspected to be secondary to underlying gastritis/diabetic gastroparesis.  Patient had an extensive work-up in the ER including CT abdomen pelvis, right upper quadrant ultrasound which shows no acute pathology. - We will observe overnight in the hospital, supportive care with IV fluids, antiemetics, pain control.  I will place her on some Reglan scheduled, IV Pepcid, oral Protonix.  Follow clinically. -If not improving consider gastroenterology consult.  2.  Essential hypertension-continue Norvasc, metoprolol.  3.  Diabetes type 2 without complication-patient currently is not taking any meds.  I will check a hemoglobin A1c, place her on sliding scale insulin.  4.  Hyperlipidemia-continue lovastatin.  5.  GERD-continue Protonix, Pepcid.  6. 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 -Hemoglobin stable.  Continue iron supplements.  7.  Chronic kidney disease stage III-patient's creatinine is close to baseline we will continue to monitor.  All the records are reviewed and case discussed with ED provider. Management plans discussed with the patient, family and they are in agreement.  CODE STATUS: Full code  TOTAL TIME TAKING CARE OF THIS PATIENT: 40 minutes.    Henreitta Leber M.D on 09/22/2017 at 7:32 PM  Between 7am to 6pm - Pager - [phone]  After 6pm go to [email or web address] - password [email or web address] [facility]  Office  [phone]  CC: Primary care physician; [facility]  [place]

## 2017-09-22 NOTE — ED Notes (Signed)
Patient transported to CT 

## 2017-09-22 NOTE — ED Triage Notes (Signed)
Per interpreter pt reports difficulty breathing, shortness of breath and pain to her chest and pit of her stomach for about a week. Pt reports the pain became unbearable last pm.

## 2017-09-22 NOTE — ED Provider Notes (Signed)
[facility] Emergency Department Provider Note ____________________________________________   First MD Initiated Contact with Patient 09/22/17 1210     (approximate)  I have reviewed the triage vital signs and the nursing notes.   HISTORY  Chief Complaint Chest Pain and Shortness of Breath    HPI Robin Rogers is a 77 y.o. female with PMH as noted below who presents with abdominal pain, primarily left-sided and in her upper abdomen for the last week, gradual onset, and worsening over the last day.  She reports associated shortness of breath and pain radiating up to her chest.  She denies nausea or vomiting, diarrhea or change in her bowel movements, or any urinary symptoms or fever.  She does report having similar pain previously.  Past Medical History:  Diagnosis Date  . Diabetes mellitus without complication ([place])   . Hypertension   . Renal disorder     There are no active problems to display for this patient.   Past Surgical History:  Procedure Laterality Date  . CHOLECYSTECTOMY      Prior to Admission medications   Medication Sig Start Date End Date Taking? Authorizing Provider  amLODipine (NORVASC) 10 MG tablet Take 1 tablet by mouth daily. 02/27/17   [provider]  clotrimazole (LOTRIMIN) 1 % cream Apply 1 application topically 2 (two) times daily. Patient not taking: Reported on 03/01/2017 10/15/15   Harvest Dark, MD  lovastatin (MEVACOR) 40 MG tablet Take 1 tablet by mouth daily. 01/21/17   [provider]  metoprolol tartrate (LOPRESSOR) 25 MG tablet Take 1 tablet by mouth 2 (two) times daily. 12/02/16   [provider]  Vitamin D, Ergocalciferol, (DRISDOL) 50000 units CAPS capsule Take 50,000 Units by mouth every 7 (seven) days.    [provider]    Allergies Patient has no known allergies.  No family history on file.  Social History Social History   Tobacco Use  . Smoking status: Never  Smoker  . Smokeless tobacco: Never Used  Substance Use Topics  . Alcohol use: No  . Drug use: Not on file    Review of Systems  Constitutional: No fever. Eyes: No redness. ENT: No sore throat. Cardiovascular: Positive for chest pain. Respiratory: Positive for shortness of breath. Gastrointestinal: No nausea, no vomiting.  No diarrhea.  Genitourinary: Negative for dysuria.  Musculoskeletal: Negative for back pain. Skin: Negative for rash. Neurological: Negative for headache.   ____________________________________________   PHYSICAL EXAM:  VITAL SIGNS: ED Triage Vitals [09/22/17 1114]  Enc Vitals Group     BP      Pulse      Resp      Temp      Temp src      SpO2      Weight 183 lb (83 kg)     Height 5' (1.524 m)     Head Circumference      Peak Flow      Pain Score 10     Pain Loc      Pain Edu?      Excl. in [place]?     Constitutional: Alert and oriented.  Slightly uncomfortable appearing but in no acute distress. Eyes: Conjunctivae are normal.  Head: Atraumatic. Nose: No congestion/rhinnorhea. Mouth/Throat: Mucous membranes are moist.   Neck: Normal range of motion.  Cardiovascular: Normal rate, regular rhythm. Grossly normal heart sounds.  Good peripheral circulation. Respiratory: Normal respiratory effort.  No retractions. Lungs CTAB. Gastrointestinal: Soft with mild epigastric and moderate  left mid and left lower quadrant tenderness. No distention.  Genitourinary: No flank tenderness. Musculoskeletal: No lower extremity edema.  Extremities warm and well perfused.  Neurologic:  Normal speech and language. No gross focal neurologic deficits are appreciated.  Skin:  Skin is warm and dry. No rash noted. Psychiatric: Mood and affect are normal. Speech and behavior are normal.  ____________________________________________   LABS (all labs ordered are listed, but only abnormal results are displayed)  Labs Reviewed  BASIC METABOLIC PANEL - Abnormal; Notable  for the following components:      Result Value   CO2 20 (*)    BUN 44 (*)    Creatinine, Ser 2.13 (*)    Calcium 8.5 (*)    GFR calc non Af Amer 22 (*)    GFR calc Af Amer 25 (*)    All other components within normal limits  CBC - Abnormal; Notable for the following components:   RBC 2.67 (*)    Hemoglobin 8.9 (*)    HCT 25.6 (*)    All other components within normal limits  HEPATIC FUNCTION PANEL - Abnormal; Notable for the following components:   Albumin 3.4 (*)    All other components within normal limits  BRAIN NATRIURETIC PEPTIDE - Abnormal; Notable for the following components:   B Natriuretic Peptide 457.0 (*)    All other components within normal limits  URINALYSIS, COMPLETE (UACMP) WITH MICROSCOPIC - Abnormal; Notable for the following components:   Color, Urine STRAW (*)    APPearance CLEAR (*)    Specific Gravity, Urine 1.004 (*)    Hgb urine dipstick SMALL (*)    Protein, ur 100 (*)    All other components within normal limits  TROPONIN I  LIPASE, BLOOD   ____________________________________________  EKG  ED ECG REPORT I, Arta Silence, the attending physician, personally viewed and interpreted this ECG.  Date: 09/22/2017 EKG Time: 1115 Rate: 96 Rhythm: normal sinus rhythm QRS Axis: normal Intervals: normal ST/T Wave abnormalities: normal Narrative Interpretation: no evidence of acute ischemia  ____________________________________________  RADIOLOGY  CXR: No focal infiltrate CT abdomen: No acute abnormality [place] RUQ: No acute findings ____________________________________________   PROCEDURES  Procedure(s) performed: No  Procedures  Critical Care performed: No ____________________________________________   INITIAL IMPRESSION / ASSESSMENT AND PLAN / ED COURSE  Pertinent labs & imaging results that were available during my care of the patient were reviewed by me and considered in my medical decision making (see chart for  details).  77 year old female with PMH as noted above including diabetes and kidney disease presents with upper and left-sided abdominal pain over the last week, with acute worsening over the last day associated with some shortness of breath and chest pain.  I reviewed the past medical records in Epic; the patient was seen in the ED in February of this year with left-sided abdominal pain and negative imaging at that time.  She states that the current pain is similar.  On exam, she is relatively well-appearing, her vital signs are normal, and the remainder of the exam is as described above.  Abdomen is soft with some epigastric and left-sided tenderness.  Differential includes diverticulitis, colitis, gastroenteritis, UTI, gastritis, or less likely hepatobiliary cause.  We will obtain CT abdomen, basic, hepatobiliary, and cardiac labs, give analgesia, and reassess.  ----------------------------------------- 5:17 PM on 09/22/2017 -----------------------------------------  The patient's work-up has been entirely negative.  When the CT was negative I added on a right upper quadrant ultrasound which also shows no  acute findings.  The patient is anemic and has elevated creatinine but these are chronic and unchanged.  Her vital signs are remained stable.  Her pain has improved after some analgesia.  Another family member has arrived who confirms that the patient's symptoms are recurrent and similar to prior ED presentations.  The patient still reported some upper abdominal discomfort and nausea so I ordered Pepcid and Maalox.  However, the patient then began vomiting.  We will give Zofran and observe a bit longer.  At this time there is no indication for admission so I anticipate discharge home but if the patient has persistent vomiting we may need to further observe her.  ----------------------------------------- 6:54 PM on 09/22/2017 -----------------------------------------  Patient appears  comfortable for a while but now is vomiting again despite being medicated.  Given that she has been in the ED for 8 hours and is still symptomatic, she will require further observation as an inpatient.  I signed the patient out to the hospitalist Dr. Verdell Carmine.  ____________________________________________   FINAL CLINICAL IMPRESSION(S) / ED DIAGNOSES  Final diagnoses:  Epigastric abdominal pain  Intractable vomiting with nausea, unspecified vomiting type      NEW MEDICATIONS STARTED DURING THIS VISIT:  New Prescriptions   No medications on file     Note:  This document was prepared using Dragon voice recognition software and may include unintentional dictation errors.    Arta Silence, MD 09/22/17 [phone]

## 2017-09-23 ENCOUNTER — Observation Stay: Payer: Medicare Other

## 2017-09-23 ENCOUNTER — Other Ambulatory Visit: Payer: Self-pay

## 2017-09-23 LAB — CBC
HEMATOCRIT: 23.3 % — AB (ref 35.0–47.0)
Hemoglobin: 8 g/dL — ABNORMAL LOW (ref 12.0–16.0)
MCH: 33.2 pg (ref 26.0–34.0)
MCHC: 34.3 g/dL (ref 32.0–36.0)
MCV: 96.8 fL (ref 80.0–100.0)
Platelets: 231 10*3/uL (ref 150–440)
RBC: 2.41 MIL/uL — ABNORMAL LOW (ref 3.80–5.20)
RDW: 13.5 % (ref 11.5–14.5)
WBC: 5.2 10*3/uL (ref 3.6–11.0)

## 2017-09-23 LAB — BASIC METABOLIC PANEL
Anion gap: 4 — ABNORMAL LOW (ref 5–15)
BUN: 47 mg/dL — AB (ref 8–23)
CHLORIDE: 107 mmol/L (ref 98–111)
CO2: 24 mmol/L (ref 22–32)
Calcium: 8.2 mg/dL — ABNORMAL LOW (ref 8.9–10.3)
Creatinine, Ser: 2.25 mg/dL — ABNORMAL HIGH (ref 0.44–1.00)
GFR calc Af Amer: 23 mL/min — ABNORMAL LOW (ref >60)
GFR calc non Af Amer: 20 mL/min — ABNORMAL LOW (ref >60)
GLUCOSE: 104 mg/dL — AB (ref 70–99)
Potassium: 5.7 mmol/L — ABNORMAL HIGH (ref 3.5–5.1)
SODIUM: 135 mmol/L (ref 135–145)

## 2017-09-23 LAB — GLUCOSE, CAPILLARY
GLUCOSE-CAPILLARY: 113 mg/dL — AB (ref 70–99)
Glucose-Capillary: 96 mg/dL (ref 70–99)
Glucose-Capillary: 95 mg/dL (ref 70–99)
Glucose-Capillary: 121 mg/dL — ABNORMAL HIGH (ref 70–99)

## 2017-09-23 MED ORDER — SODIUM POLYSTYRENE SULFONATE 15 GM/60ML PO SUSP
30.0000 g | Freq: Once | ORAL | Status: AC
  Administered 2017-09-23: 30 g via ORAL
  Filled 2017-09-23: qty 120

## 2017-09-23 MED ORDER — INSULIN ASPART 100 UNIT/ML IV SOLN
10.0000 [IU] | Freq: Once | INTRAVENOUS | Status: AC
  Administered 2017-09-23: 09:00:00 10 [IU] via INTRAVENOUS
  Filled 2017-09-23: qty 0.1

## 2017-09-23 MED ORDER — SENNA 8.6 MG PO TABS
1.0000 | ORAL_TABLET | Freq: Every day | ORAL | Status: DC
  Administered 2017-09-23 – 2017-09-25 (×3): 8.6 mg via ORAL
  Filled 2017-09-23 (×3): qty 1

## 2017-09-23 MED ORDER — DEXTROSE 50 % IV SOLN
25.0000 mL | Freq: Once | INTRAVENOUS | Status: AC
  Administered 2017-09-23: 09:00:00 25 mL via INTRAVENOUS
  Filled 2017-09-23: qty 50

## 2017-09-23 MED ORDER — AMOXICILLIN 500 MG PO CAPS: 500.0000 mg | ORAL_CAPSULE | Freq: Three times a day (TID) | ORAL | Status: DC

## 2017-09-23 MED ORDER — DOCUSATE SODIUM 100 MG PO CAPS
100.0000 mg | ORAL_CAPSULE | Freq: Two times a day (BID) | ORAL | Status: DC
  Administered 2017-09-23 – 2017-09-25 (×4): 100 mg via ORAL
  Filled 2017-09-23 (×4): qty 1

## 2017-09-23 MED ORDER — LORAZEPAM 0.5 MG PO TABS
0.5000 mg | ORAL_TABLET | Freq: Every evening | ORAL | Status: DC | PRN
  Administered 2017-09-23: 0.5 mg via ORAL
  Filled 2017-09-23: qty 1

## 2017-09-23 NOTE — Care Management Important Message (Signed)
Important Message  Patient Details  Name: Robin Rogers MRN: [id number] Date of Birth: 04/21/1940   Medicare Important Message Given:   Explained through interpreter [place]  given    Shelbie Ammons, RN 09/23/2017, 9:18 AM

## 2017-09-23 NOTE — Progress Notes (Signed)
Confirmed with Dr. Benjie Karvonen patient still receiving Dextrose and Insulin with BS 96.

## 2017-09-23 NOTE — Consult Note (Signed)
GI Inpatient Consult Note  Reason for Consult: Generalized abd pain, nausea, vomiting   Attending Requesting Consult: Dr. Bettey Costa, MD  History of Present Illness: Robin Rogers is a 77 y.o. female seen for evaluation of generalized abdominal pain, nausea, and vomiting at the request of Dr. Benjie Karvonen, MD. Patient has a PMH of diabetes, hypertension, CKD Stage III, legal blindness admitted to the hospital last night for generalized abdominal pain and intractable nausea and vomiting. Pt underwent RUQ [place] and CT abd/pelvis which were both negative for intra-abdominal pathology. LFTs normal. Prior hx of cholecystectomy. A1C pending.  Pt seen and examined this afternoon in hospital bed. Husband in room during exam. Interpretor present and helps with history. Robin Rogers reports she has had a 1-year hx of generalized abdominal pain that has worsened over the past 1-2 months. She reports pain is currently located in the LLQ and epigastric region. Pain is described as sharp and dull in nature. Pain sometimes radiates to the LUQ. Pain has been fairly constant. She also endorses intermittent nausea and vomiting that is brought on by larger meals or anytime she eats food. Last emesis episode was yesterday morning. Nausea and vomiting has been well controlled while in the hospital. Abdominal pain is reduced in severity when having a bowel movement. She has had some salad today and was able to tolerate this without difficulty. She denies bloody or dark stools. No frequent NSAID use. She denies any prior EGD. She denies dysphagia, odynophagia.    Last Colonoscopy: Unknown  Last Endoscopy: Unknown    Past Medical History:  Past Medical History:  Diagnosis Date  . Diabetes mellitus without complication ([place])   . Hypertension   . Renal disorder     Problem List: Patient Active Problem List   Diagnosis Date Noted  . Intractable nausea and vomiting 09/22/2017    Past Surgical History: Past Surgical History:   Procedure Laterality Date  . CHOLECYSTECTOMY      Allergies: No Known Allergies  Home Medications: Medications Prior to Admission  Medication Sig Dispense Refill Last Dose  . amLODipine (NORVASC) 10 MG tablet Take 1 tablet by mouth daily.   09/21/2017 at 0800  . aspirin EC 81 MG tablet Take 81 mg by mouth daily.   09/21/2017 at 0800  . carboxymethylcellulose (REFRESH PLUS) 0.5 % SOLN Place 1 drop into the right eye 4 (four) times daily.   09/21/2017 at 2100  . ferrous sulfate 325 (65 FE) MG tablet Take 325 mg by mouth 2 (two) times daily.   09/21/2017 at 1700  . lovastatin (MEVACOR) 40 MG tablet Take 1 tablet by mouth at bedtime.    09/21/2017 at 2100  . metoprolol tartrate (LOPRESSOR) 25 MG tablet Take 1 tablet by mouth 2 (two) times daily.   09/21/2017 at 1700  . omeprazole (PRILOSEC) 40 MG capsule Take 40 mg by mouth daily.   09/21/2017 at 0800  . prednisoLONE acetate (PRED FORTE) 1 % ophthalmic suspension Place 1 drop into the left eye 2 (two) times daily as needed.   Past Month at PRN  . Vitamin D, Ergocalciferol, (DRISDOL) 50000 units CAPS capsule Take 50,000 Units by mouth every 7 (seven) days.   Past Week at Unknown time   Home medication reconciliation was completed with the patient.   Scheduled Inpatient Medications:   . amLODipine  10 mg Oral Daily  . aspirin EC  81 mg Oral Daily  . docusate sodium  100 mg Oral BID  . ferrous sulfate  325 mg Oral BID  . heparin  5,000 Units Subcutaneous Q8H  . Influenza vac split quadrivalent PF  0.5 mL Intramuscular Tomorrow-1000  . insulin aspart  0-5 Units Subcutaneous QHS  . insulin aspart  0-9 Units Subcutaneous TID WC  . metoCLOPramide (REGLAN) injection  5 mg Intravenous Q6H  . metoprolol tartrate  25 mg Oral BID  . pantoprazole  40 mg Oral Daily  . pneumococcal 23 valent vaccine  0.5 mL Intramuscular Tomorrow-1000  . polyvinyl alcohol  1 drop Right Eye QID  . pravastatin  40 mg Oral q1800  . senna  1 tablet Oral Daily  . Vitamin D  (Ergocalciferol)  50,000 Units Oral Q7 days    Continuous Inpatient Infusions:    PRN Inpatient Medications:  acetaminophen **OR** acetaminophen, LORazepam, ondansetron **OR** ondansetron (ZOFRAN) IV  Family History: family history is not on file.  The patient's family history is negative for inflammatory bowel disorders, GI malignancy, or solid organ transplantation.  Social History:   reports that she has never smoked. She has never used smokeless tobacco. She reports that she does not drink alcohol or use drugs. The patient denies ETOH, tobacco, or drug use.   Review of Systems: Constitutional: Weight is stable.  Eyes: No changes in vision. ENT: No oral lesions, sore throat.  GI: see HPI.  Heme/Lymph: No easy bruising.  CV: No chest pain.  GU: No hematuria.  Integumentary: No rashes.  Neuro: No headaches.  Psych: No depression/anxiety.  Endocrine: No heat/cold intolerance.  Allergic/Immunologic: No urticaria.  Resp: No cough, SOB.  Musculoskeletal: No joint swelling.    Physical Examination: BP (!) 148/59 (BP Location: Left Arm)   Pulse 78   Temp 98.9 F (37.2 C) (Oral)   Resp 17   Ht 5' (1.524 m)   Wt 80.6 kg   SpO2 93%   BMI 34.70 kg/m  Gen: NAD, alert and oriented x 4 HEENT: Pt is blind Neck: supple, no JVD or thyromegaly Chest: CTA bilaterally, no wheezes, crackles, or other adventitious sounds CV: RRR, no m/g/c/r Abd: soft, diffusely tender to palpation in epigastric, LLQ and LUQ, ND, +BS in all four quadrants; no HSM, guarding, ridigity, or rebound tenderness Ext: no edema, well perfused with 2+ pulses, Skin: no rash or lesions noted Lymph: no LAD  Data: Lab Results  Component Value Date   WBC 5.2 09/23/2017   HGB 8.0 (L) 09/23/2017   HCT 23.3 (L) 09/23/2017   MCV 96.8 09/23/2017   PLT 231 09/23/2017   Recent Labs  Lab 09/22/17 1122 09/23/17 0420  HGB 8.9* 8.0*   Lab Results  Component Value Date   NA 135 09/23/2017   K 5.7 (H) 09/23/2017    CL 107 09/23/2017   CO2 24 09/23/2017   BUN 47 (H) 09/23/2017   CREATININE 2.25 (H) 09/23/2017   Lab Results  Component Value Date   ALT 17 09/22/2017   AST 24 09/22/2017   ALKPHOS 107 09/22/2017   BILITOT 0.4 09/22/2017   No results for input(s): APTT, INR, PTT in the last 168 hours. Assessment/Plan: Robin Rogers is a 77 y/o Hispanic female with a PMH of legal blindness, diabetes, hypertension, hyperlipidemia, and CKD Stage III admitted for generalized abd pain with nausea and vomiting  1. Generalized abdominal pain: Pt is afebrile, normal LFTs, with negative RUQ [place] and CT abd/pelvis. Tender to palpation in epigastric region and LUQ. DDx includes gastritis, PUD, duodenitis, functional, IBS, diabetic gastroparesis, musculoskeletal  2. Diabetes - due to nausea  and vomiting with abdominal pain after large meals, consider diabetic gastroparesis.  Recommendations:  1. Plan for EGD with Dr. Alice Reichert tomorrow  2. Continue Protonix 40 mg daily 2. Discussed procedure details and indications with pt and her husband. We discussed potential risks and complications, including bleeding, infection, small puncture to esophagus or internal organs, or problems with oversedation. She consents to proceed.  3. Further recs after EGD. If EGD is negative, could consider gastric emptying scan to rule out gastroparesis.   Thank you for the consult. Please call with questions or concerns.  Geanie Kenning, PA-C [facility]  [phone]

## 2017-09-23 NOTE — Progress Notes (Addendum)
[facility] [place] [facility] NAME: Logyn Dedominicis    MR#:  [id number]  DATE OF BIRTH:  25-Sep-1940  SUBJECTIVE:   Patient has abdominal pain.  This is generalized abdominal pain.  Does report that after bowel movement her abdominal pain had improved.  She underwent CT scan and ultrasound all of which were negative for acute pathology.  She wants to eat.  She would like to see an GI doctor due to ongoing abdominal pain.  She denies nausea or vomiting.  REVIEW OF SYSTEMS:    Review of Systems  Constitutional: Negative for fever, chills weight loss HENT: Negative for ear pain, nosebleeds, congestion, facial swelling, rhinorrhea, neck pain, neck stiffness and ear discharge.   Respiratory: Negative for cough, shortness of breath, wheezing  Cardiovascular: Negative for chest pain, palpitations and leg swelling.  Gastrointestinal: Negative for nausea, vomiting positive abdominal pain generalized Genitourinary: Negative for dysuria, urgency, frequency, hematuria Musculoskeletal: Negative for back pain or joint pain Neurological: Negative for dizziness, seizures, syncope, focal weakness,  numbness and headaches.  Hematological: Does not bruise/bleed easily.  Psychiatric/Behavioral: Negative for hallucinations, confusion, dysphoric mood    Tolerating Diet: yes      DRUG ALLERGIES:  No Known Allergies  VITALS:  Blood pressure 138/62, pulse 64, temperature 98.2 F (36.8 C), resp. rate 18, height 5' (1.524 m), weight 80.6 kg, SpO2 (!) 89 %.  PHYSICAL EXAMINATION:  Constitutional: Appears well-developed and well-nourished. No distress. HENT: Normocephalic. Marland Kitchen Oropharynx is clear and moist.  Eyes: Conjunctivae and EOM are normal. PERRLA, no scleral icterus.  Neck: Normal ROM. Neck supple. No JVD. No tracheal deviation. CVS: RRR, S1/S2 +, no murmurs, no gallops, no carotid bruit.  Pulmonary: Effort and breath sounds normal, no stridor, rhonchi, wheezes,  rales.  Abdominal: Soft. BS +,  no distension, tenderness, rebound or guarding.  Musculoskeletal: Normal range of motion. No edema and no tenderness.  Neuro: Alert. CN 2-12 grossly intact. No focal deficits. Skin: Skin is warm and dry. No rash noted. Psychiatric: Normal mood and affect.      LABORATORY PANEL:   CBC Recent Labs  Lab 09/23/17 0420  WBC 5.2  HGB 8.0*  HCT 23.3*  PLT 231   ------------------------------------------------------------------------------------------------------------------  Chemistries  Recent Labs  Lab 09/22/17 1122 09/23/17 0420  NA 135 135  K 4.9 5.7*  CL 103 107  CO2 20* 24  GLUCOSE 99 104*  BUN 44* 47*  CREATININE 2.13* 2.25*  CALCIUM 8.5* 8.2*  AST 24  --   ALT 17  --   ALKPHOS 107  --   BILITOT 0.4  --    ------------------------------------------------------------------------------------------------------------------  Cardiac Enzymes Recent Labs  Lab 09/22/17 1122  TROPONINI <0.03   ------------------------------------------------------------------------------------------------------------------  RADIOLOGY:  Ct Abdomen Pelvis Wo Contrast  Result Date: 09/22/2017 CLINICAL DATA:  Acute upper abdominal pain. EXAM: CT ABDOMEN AND PELVIS WITHOUT CONTRAST TECHNIQUE: Multidetector CT imaging of the abdomen and pelvis was performed following the standard protocol without IV contrast. COMPARISON:  CT scan July 20, 2017. FINDINGS: Lower chest: No acute abnormality. Hepatobiliary: No focal liver abnormality is seen. Status post cholecystectomy. No biliary dilatation. Pancreas: Unremarkable. No pancreatic ductal dilatation or surrounding inflammatory changes. Spleen: Normal in size without focal abnormality. Adrenals/Urinary Tract: Adrenal glands are unremarkable. Kidneys are normal, without renal calculi, focal lesion, or hydronephrosis. Bladder is unremarkable. Stomach/Bowel: Stomach is within normal limits. Appendix appears normal. No  evidence of bowel wall thickening, distention, or inflammatory changes. Vascular/Lymphatic: Aortic atherosclerosis. No  enlarged abdominal or pelvic lymph nodes. Reproductive: Uterus and bilateral adnexa are unremarkable. Other: No abdominal wall hernia or abnormality. No abdominopelvic ascites. Musculoskeletal: No acute or significant osseous findings. IMPRESSION: No acute abnormality seen in the abdomen or pelvis. Aortic Atherosclerosis (ICD10-I70.0). Electronically Signed   By: Marijo Conception, M.D.   On: 09/22/2017 13:02   Dg Chest 2 View  Result Date: 09/22/2017 CLINICAL DATA:  Chest pain EXAM: CHEST - 2 VIEW COMPARISON:  05/19/2013 FINDINGS: Cardiomegaly with vascular congestion and possible early interstitial edema. Right base atelectasis and small right effusion. No acute bony abnormality. IMPRESSION: Cardiomegaly with vascular congestion and possible early interstitial edema. Right base atelectasis and small right effusion. Electronically Signed   By: Rolm Baptise M.D.   On: 09/22/2017 11:34   US Abdomen Limited Ruq  Result Date: 09/22/2017 CLINICAL DATA:  Abdominal pain EXAM: ULTRASOUND ABDOMEN LIMITED RIGHT UPPER QUADRANT COMPARISON:  CT abdomen and pelvis September 22, 2017 FINDINGS: Gallbladder: Surgically absent. Common bile duct: Diameter: 6 mm. No intrahepatic or extrahepatic biliary duct dilatation. Liver: No focal lesion identified. Within normal limits in parenchymal echogenicity. Portal vein is patent on color Doppler imaging with normal direction of blood flow towards the liver. Right kidney appears echogenic. IMPRESSION: Gallbladder absent. Echogenic right kidney. Question a degree of underlying medical renal disease. Study otherwise unremarkable. Electronically Signed   By: Lowella Grip III M.D.   On: 09/22/2017 13:46     ASSESSMENT AND PLAN:   77 year old female with chronic kidney disease stage III with baseline creatinine of 2.2 diabetes who presents with abdominal pain,  nausea and vomiting.  1.  Abdominal pain, nausea and vomiting: Nausea and vomiting have improved. GI consultation for further evaluation with possible EGD if patient agrees. I believe that the abdominal pain may be due to constipation as she does report it was improved yesterday after having a bowel movement and therefore I will order stool softeners. Continue diet  2.  Diabetes: Continue sliding scale and ADA diet  3.  Essential hypertension: Continue Norvasc and metoprolol  4.  Chronic kidney disease stage III with baseline creatinine of 2.2 This appears stable at this time  5.  Hyperkalemia: This will be treated with Kayexalate, dextrose and insulin.   Management plans discussed with the patient and she is in agreement.  CODE STATUS: full  TOTAL TIME TAKING CARE OF THIS PATIENT: 30 minutes.     POSSIBLE D/C tomorrow, DEPENDING ON CLINICAL CONDITION.   Kesleigh Morson M.D on 09/23/2017 at 9:33 AM  Between 7am to 6pm - Pager - [phone] After 6pm go to [email or web address] - password [email or web address] [facility]  Office  [phone]  CC: Primary care physician; [facility], [place]  Note: This dictation was prepared with Diplomatic Services operational officer dictation along with smaller phrase technology. Any transcriptional errors that result from this process are unintentional.

## 2017-09-23 NOTE — Care Management Note (Addendum)
Case Management Note  Patient Details  Name: KORDELIA SEVERIN MRN: [id number] Date of Birth: 24-Jun-1940  Subjective/Objective:  Admitted to [facility] under observation status with the diagnosis of intractable nausea & vomiting.Lives with husband Sosa. [phone]. Goes to [employer]. Was at [name] 8 months ago. Interpreter present to explain MOON letter                 Action/Plan: Will continue to follow for plans  Expected Discharge Date:  09/24/17               Expected Discharge Plan:     In-House Referral:     Discharge planning Services     Post Acute Care Choice:    Choice offered to:     DME Arranged:    DME Agency:     HH Arranged:    HH Agency:     Status of Service:     If discussed at [employer], dates discussed:    Additional Comments:  Shelbie Ammons, RN MSN CCM Care Management [phone] 09/23/2017, 8:53 AM

## 2017-09-24 ENCOUNTER — Inpatient Hospital Stay (HOSPITAL_COMMUNITY)
Admit: 2017-09-24 | Discharge: 2017-09-24 | Disposition: A | Discharge status: Home / Self Care | Payer: Medicare Other | Attending: Internal Medicine | Admitting: Internal Medicine

## 2017-09-24 ENCOUNTER — Inpatient Hospital Stay: Payer: Medicare Other | Admitting: Anesthesiology

## 2017-09-24 ENCOUNTER — Encounter
Admission: EM | Disposition: A | Discharge status: Home / Self Care | Payer: Self-pay | Source: Home / Self Care | Attending: Internal Medicine

## 2017-09-24 DIAGNOSIS — N183 Chronic kidney disease, stage 3 (moderate): Secondary | ICD-10-CM | POA: Diagnosis present

## 2017-09-24 DIAGNOSIS — I5043 Acute on chronic combined systolic (congestive) and diastolic (congestive) heart failure: Secondary | ICD-10-CM | POA: Diagnosis not present

## 2017-09-24 DIAGNOSIS — I130 Hypertensive heart and chronic kidney disease with heart failure and stage 1 through stage 4 chronic kidney disease, or unspecified chronic kidney disease: Secondary | ICD-10-CM | POA: Diagnosis present

## 2017-09-24 DIAGNOSIS — J9601 Acute respiratory failure with hypoxia: Secondary | ICD-10-CM | POA: Diagnosis not present

## 2017-09-24 DIAGNOSIS — Z79899 Other long term (current) drug therapy: Secondary | ICD-10-CM | POA: Diagnosis not present

## 2017-09-24 DIAGNOSIS — I340 Nonrheumatic mitral (valve) insufficiency: Secondary | ICD-10-CM

## 2017-09-24 DIAGNOSIS — K449 Diaphragmatic hernia without obstruction or gangrene: Secondary | ICD-10-CM | POA: Diagnosis present

## 2017-09-24 DIAGNOSIS — Z7982 Long term (current) use of aspirin: Secondary | ICD-10-CM | POA: Diagnosis not present

## 2017-09-24 DIAGNOSIS — Z9049 Acquired absence of other specified parts of digestive tract: Secondary | ICD-10-CM | POA: Diagnosis not present

## 2017-09-24 DIAGNOSIS — Z23 Encounter for immunization: Secondary | ICD-10-CM | POA: Diagnosis present

## 2017-09-24 DIAGNOSIS — K2970 Gastritis, unspecified, without bleeding: Secondary | ICD-10-CM | POA: Diagnosis present

## 2017-09-24 DIAGNOSIS — E785 Hyperlipidemia, unspecified: Secondary | ICD-10-CM | POA: Diagnosis present

## 2017-09-24 DIAGNOSIS — K529 Noninfective gastroenteritis and colitis, unspecified: Secondary | ICD-10-CM | POA: Diagnosis present

## 2017-09-24 DIAGNOSIS — I351 Nonrheumatic aortic (valve) insufficiency: Secondary | ICD-10-CM | POA: Diagnosis not present

## 2017-09-24 DIAGNOSIS — K219 Gastro-esophageal reflux disease without esophagitis: Secondary | ICD-10-CM | POA: Diagnosis present

## 2017-09-24 DIAGNOSIS — H548 Legal blindness, as defined in USA: Secondary | ICD-10-CM | POA: Diagnosis present

## 2017-09-24 DIAGNOSIS — E1122 Type 2 diabetes mellitus with diabetic chronic kidney disease: Secondary | ICD-10-CM | POA: Diagnosis present

## 2017-09-24 DIAGNOSIS — D631 Anemia in chronic kidney disease: Secondary | ICD-10-CM | POA: Diagnosis present

## 2017-09-24 DIAGNOSIS — E875 Hyperkalemia: Secondary | ICD-10-CM | POA: Diagnosis not present

## 2017-09-24 DIAGNOSIS — R1013 Epigastric pain: Secondary | ICD-10-CM | POA: Diagnosis present

## 2017-09-24 DIAGNOSIS — E1143 Type 2 diabetes mellitus with diabetic autonomic (poly)neuropathy: Secondary | ICD-10-CM | POA: Diagnosis present

## 2017-09-24 DIAGNOSIS — K5900 Constipation, unspecified: Secondary | ICD-10-CM | POA: Diagnosis present

## 2017-09-24 DIAGNOSIS — K3184 Gastroparesis: Secondary | ICD-10-CM | POA: Diagnosis present

## 2017-09-24 HISTORY — PX: ESOPHAGOGASTRODUODENOSCOPY: SHX5428

## 2017-09-24 LAB — HEMOGLOBIN A1C
Hgb A1c MFr Bld: 5.7 % — ABNORMAL HIGH (ref 4.8–5.6)
Mean Plasma Glucose: 117 mg/dL

## 2017-09-24 LAB — BASIC METABOLIC PANEL
ANION GAP: 6 (ref 5–15)
BUN: 42 mg/dL — ABNORMAL HIGH (ref 8–23)
CALCIUM: 8.1 mg/dL — AB (ref 8.9–10.3)
CO2: 24 mmol/L (ref 22–32)
Chloride: 105 mmol/L (ref 98–111)
Creatinine, Ser: 2.41 mg/dL — ABNORMAL HIGH (ref 0.44–1.00)
GFR, EST AFRICAN AMERICAN: 21 mL/min — AB (ref >60)
GFR, EST NON AFRICAN AMERICAN: 19 mL/min — AB (ref >60)
GLUCOSE: 134 mg/dL — AB (ref 70–99)
Potassium: 4.9 mmol/L (ref 3.5–5.1)
Sodium: 135 mmol/L (ref 135–145)

## 2017-09-24 LAB — GLUCOSE, CAPILLARY
GLUCOSE-CAPILLARY: 89 mg/dL (ref 70–99)
GLUCOSE-CAPILLARY: 100 mg/dL — AB (ref 70–99)
Glucose-Capillary: 95 mg/dL (ref 70–99)
Glucose-Capillary: 132 mg/dL — ABNORMAL HIGH (ref 70–99)

## 2017-09-24 LAB — PROTEIN / CREATININE RATIO, URINE
Creatinine, Urine: 78 mg/dL
PROTEIN CREATININE RATIO: 3.78 mg/mg{creat} — AB (ref 0.00–0.15)
Total Protein, Urine: 295 mg/dL

## 2017-09-24 LAB — TROPONIN I: Troponin I: <0.03 ng/mL (ref <0.03)

## 2017-09-24 SURGERY — EGD (ESOPHAGOGASTRODUODENOSCOPY)
Anesthesia: General

## 2017-09-24 MED ORDER — PROPOFOL 500 MG/50ML IV EMUL
INTRAVENOUS | Status: DC | PRN
  Administered 2017-09-24: 150 ug/kg/min via INTRAVENOUS

## 2017-09-24 MED ORDER — GLYCOPYRROLATE 0.2 MG/ML IJ SOLN
INTRAMUSCULAR | Status: AC
  Filled 2017-09-24: qty 1

## 2017-09-24 MED ORDER — FUROSEMIDE 10 MG/ML IJ SOLN
20.0000 mg | Freq: Two times a day (BID) | INTRAMUSCULAR | Status: DC
  Administered 2017-09-24 – 2017-09-25 (×2): 20 mg via INTRAVENOUS
  Filled 2017-09-24 (×2): qty 2

## 2017-09-24 MED ORDER — MORPHINE SULFATE (PF) 2 MG/ML IV SOLN
2.0000 mg | INTRAVENOUS | Status: DC | PRN
  Administered 2017-09-24: 2 mg via INTRAVENOUS
  Filled 2017-09-24: qty 1

## 2017-09-24 MED ORDER — SODIUM CHLORIDE 0.9 % IV SOLN
INTRAVENOUS | Status: DC
  Administered 2017-09-24: 1000 mL via INTRAVENOUS

## 2017-09-24 MED ORDER — INFLUENZA VAC SPLIT HIGH-DOSE 0.5 ML IM SUSY
0.5000 mL | PREFILLED_SYRINGE | INTRAMUSCULAR | Status: DC
  Filled 2017-09-24 (×2): qty 0.5

## 2017-09-24 MED ORDER — PNEUMOCOCCAL VAC POLYVALENT 25 MCG/0.5ML IJ INJ
0.5000 mL | INJECTION | INTRAMUSCULAR | Status: AC
  Administered 2017-09-25: 0.5 mL via INTRAMUSCULAR
  Filled 2017-09-24: qty 0.5

## 2017-09-24 MED ORDER — PROPOFOL 10 MG/ML IV BOLUS
INTRAVENOUS | Status: AC
  Filled 2017-09-24: qty 20

## 2017-09-24 MED ORDER — FUROSEMIDE 10 MG/ML IJ SOLN: 40.0000 mg | Freq: Two times a day (BID) | INTRAMUSCULAR | Status: DC

## 2017-09-24 MED ORDER — PROPOFOL 10 MG/ML IV BOLUS
INTRAVENOUS | Status: DC | PRN
  Administered 2017-09-24: 70 mg via INTRAVENOUS

## 2017-09-24 MED ORDER — SODIUM CHLORIDE 0.9 % IV SOLN
INTRAVENOUS | Status: DC
  Administered 2017-09-24: 08:00:00 via INTRAVENOUS

## 2017-09-24 MED ORDER — LIDOCAINE HCL (CARDIAC) PF 100 MG/5ML IV SOSY
PREFILLED_SYRINGE | INTRAVENOUS | Status: DC | PRN
  Administered 2017-09-24: 40 mg via INTRAVENOUS

## 2017-09-24 NOTE — Anesthesia Post-op Follow-up Note (Signed)
Anesthesia QCDR form completed.        

## 2017-09-24 NOTE — Op Note (Signed)
[facility] Patient Name: Robin Rogers Procedure Date: 09/24/2017 1:58 PM MRN: [id number] Account #: [id number] Date of Birth: 1940/06/21 Admit Type: Inpatient Age: 77 Room: [facility] ENDO ROOM 1 Gender: Female Note Status: Finalized Procedure:            Upper GI endoscopy Indications:          Epigastric abdominal pain, Abdominal pain in the right                        upper quadrant Providers:            Benay Pike. Alice Reichert MD, MD Referring MD:         No Local Md, MD (Referring MD) Medicines:            Propofol per Anesthesia Complications:        No immediate complications. Procedure:            Pre-Anesthesia Assessment:                       - The risks and benefits of the procedure and the                        sedation options and risks were discussed with the                        patient. All questions were answered and informed                        consent was obtained.                       - Patient identification and proposed procedure were                        verified prior to the procedure by the nurse. The                        procedure was verified in the procedure room.                       - ASA Grade Assessment: III - A patient with severe                        systemic disease.                       - After reviewing the risks and benefits, the patient                        was deemed in satisfactory condition to undergo the                        procedure.                       After obtaining informed consent, the endoscope was                        passed under direct vision. Throughout the procedure,  the patient's blood pressure, pulse, and oxygen                        saturations were monitored continuously. The Endoscope                        was introduced through the mouth, and advanced to the                        third part of duodenum. The upper GI endoscopy was                         accomplished without difficulty. The patient tolerated                        the procedure well. Findings:      The examined esophagus was normal.      Striped mildly erythematous mucosa without bleeding was found in the       gastric body and in the gastric antrum. Biopsies were taken with a cold       forceps for Helicobacter pylori testing.      A 1 cm hiatal hernia was present.      The examined duodenum was normal.      The exam was otherwise without abnormality. Impression:           - Normal esophagus.                       - Erythematous mucosa in the gastric body and antrum.                        Biopsied.                       - 1 cm hiatal hernia.                       - Normal examined duodenum.                       - The examination was otherwise normal. Recommendation:       - Await pathology results.                       - Advance diet as tolerated.                       - I anticipate no further need for intervention.                       - Will sign off, call back if needed. Procedure Code(s):    --- Professional ---                       [phone], Esophagogastroduodenoscopy, flexible, transoral;                        with biopsy, single or multiple Diagnosis Code(s):    --- Professional ---                       R10.11, Right upper quadrant pain  R10.13, Epigastric pain                       K44.9, Diaphragmatic hernia without obstruction or                        gangrene                       K31.89, Other diseases of stomach and duodenum CPT copyright 2017 American Medical Association. All rights reserved. The codes documented in this report are preliminary and upon coder review may  be revised to meet current compliance requirements. Efrain Sella MD, MD 09/24/2017 6:58:00 PM This report has been signed electronically. Number of Addenda: 0 Note Initiated On: 09/24/2017 1:58 PM      [facility]

## 2017-09-24 NOTE — Transfer of Care (Signed)
Immediate Anesthesia Transfer of Care Note  Patient: Robin Rogers  Procedure(s) Performed: Procedure(s): ESOPHAGOGASTRODUODENOSCOPY (EGD) (N/A)  Patient Location: PACU and Endoscopy Unit  Anesthesia Type:General  Level of Consciousness: sedated  Airway & Oxygen Therapy: Patient Spontanous Breathing and Patient connected to nasal cannula oxygen  Post-op Assessment: Report given to RN and Post -op Vital signs reviewed and stable  Post vital signs: Reviewed and stable  Last Vitals:  Vitals:   09/24/17 1259 09/24/17 1411  BP: (!) 153/72 (!) 109/41  Pulse: 80 68  Resp: 20 20  Temp: (!) 35.8 C (!) 36.3 C  SpO2: 578% 46%    Complications: No apparent anesthesia complications

## 2017-09-24 NOTE — Progress Notes (Signed)
[facility] NAME: Robin Rogers    MR#:  [id number]  DATE OF BIRTH:  Oct 16, 1940  SUBJECTIVE:   Patient midepigastric abdominal pain.  Reports that her lower abdominal pain improves after bowel movement.  REVIEW OF SYSTEMS:    Review of Systems  Constitutional: Negative for fever, chills weight loss HENT: Negative for ear pain, nosebleeds, congestion, facial swelling, rhinorrhea, neck pain, neck stiffness and ear discharge.   Respiratory: Negative for cough, shortness of breath, wheezing  Cardiovascular: Negative for chest pain, palpitations and leg swelling.  Gastrointestinal: Negative for nausea, vomiting positive abdominal pain  Genitourinary: Negative for dysuria, urgency, frequency, hematuria Musculoskeletal: Negative for back pain or joint pain Neurological: Negative for dizziness, seizures, syncope, focal weakness,  numbness and headaches.  Hematological: Does not bruise/bleed easily.  Psychiatric/Behavioral: Negative for hallucinations, confusion, dysphoric mood    Tolerating Diet: N.p.o.      DRUG ALLERGIES:  No Known Allergies  VITALS:  Blood pressure (!) 142/62, pulse 74, temperature 98.5 F (36.9 C), temperature source Oral, resp. rate 20, height 5' (1.524 m), weight 80.6 kg, SpO2 98 %.  PHYSICAL EXAMINATION:  Constitutional: Appears well-developed and well-nourished. No distress. HENT: Normocephalic. Marland Kitchen Oropharynx is clear and moist.  Eyes: Conjunctivae and EOM are normal. PERRLA, no scleral icterus.  Neck: Normal ROM. Neck supple. No JVD. No tracheal deviation. CVS: RRR, S1/S2 +, no murmurs, no gallops, no carotid bruit.  Pulmonary: Effort and breath sounds normal, no stridor, rhonchi, wheezes, rales.  Abdominal: Soft. BS +,  no distension, tenderness, rebound or guarding.  Musculoskeletal: Normal range of motion. No edema and no tenderness.  Neuro: Alert. CN 2-12 grossly intact. No focal deficits. Skin:  Skin is warm and dry. No rash noted. Psychiatric: Normal mood and affect.      LABORATORY PANEL:   CBC Recent Labs  Lab 09/23/17 0420  WBC 5.2  HGB 8.0*  HCT 23.3*  PLT 231   ------------------------------------------------------------------------------------------------------------------  Chemistries  Recent Labs  Lab 09/22/17 1122  09/24/17 0017  NA 135   < > 135  K 4.9   < > 4.9  CL 103   < > 105  CO2 20*   < > 24  GLUCOSE 99   < > 134*  BUN 44*   < > 42*  CREATININE 2.13*   < > 2.41*  CALCIUM 8.5*   < > 8.1*  AST 24  --   --   ALT 17  --   --   ALKPHOS 107  --   --   BILITOT 0.4  --   --    < > = values in this interval not displayed.   ------------------------------------------------------------------------------------------------------------------  Cardiac Enzymes Recent Labs  Lab 09/22/17 1122 09/24/17 0017  TROPONINI <0.03 <0.03   ------------------------------------------------------------------------------------------------------------------  RADIOLOGY:  Ct Abdomen Pelvis Wo Contrast  Result Date: 09/22/2017 CLINICAL DATA:  Acute upper abdominal pain. EXAM: CT ABDOMEN AND PELVIS WITHOUT CONTRAST TECHNIQUE: Multidetector CT imaging of the abdomen and pelvis was performed following the standard protocol without IV contrast. COMPARISON:  CT scan July 20, 2017. FINDINGS: Lower chest: No acute abnormality. Hepatobiliary: No focal liver abnormality is seen. Status post cholecystectomy. No biliary dilatation. Pancreas: Unremarkable. No pancreatic ductal dilatation or surrounding inflammatory changes. Spleen: Normal in size without focal abnormality. Adrenals/Urinary Tract: Adrenal glands are unremarkable. Kidneys are normal, without renal calculi, focal lesion, or hydronephrosis. Bladder is unremarkable. Stomach/Bowel: Stomach is within normal limits. Appendix appears normal.  No evidence of bowel wall thickening, distention, or inflammatory changes.  Vascular/Lymphatic: Aortic atherosclerosis. No enlarged abdominal or pelvic lymph nodes. Reproductive: Uterus and bilateral adnexa are unremarkable. Other: No abdominal wall hernia or abnormality. No abdominopelvic ascites. Musculoskeletal: No acute or significant osseous findings. IMPRESSION: No acute abnormality seen in the abdomen or pelvis. Aortic Atherosclerosis (ICD10-I70.0). Electronically Signed   By: Marijo Conception, M.D.   On: 09/22/2017 13:02   Dg Chest 2 View  Result Date: 09/22/2017 CLINICAL DATA:  Chest pain EXAM: CHEST - 2 VIEW COMPARISON:  05/19/2013 FINDINGS: Cardiomegaly with vascular congestion and possible early interstitial edema. Right base atelectasis and small right effusion. No acute bony abnormality. IMPRESSION: Cardiomegaly with vascular congestion and possible early interstitial edema. Right base atelectasis and small right effusion. Electronically Signed   By: Rolm Baptise M.D.   On: 09/22/2017 11:34   Dg Abd 1 View  Result Date: 09/23/2017 CLINICAL DATA:  77 year old female with generalized and left upper quadrant abdominal pain for several days. EXAM: ABDOMEN - 1 VIEW COMPARISON:  CT Abdomen and Pelvis 09/22/2017 and earlier. FINDINGS: AP supine views at 0944 hours. Stable cholecystectomy clips. The Non obstructed bowel gas pattern. Little retained stool in the colon as demonstrated by CT yesterday. Partially visible cardiomegaly. No acute osseous abnormality identified. IMPRESSION: Stable and normal bowel gas pattern. Electronically Signed   By: Genevie Ann M.D.   On: 09/23/2017 10:30   US Abdomen Limited Ruq  Result Date: 09/22/2017 CLINICAL DATA:  Abdominal pain EXAM: ULTRASOUND ABDOMEN LIMITED RIGHT UPPER QUADRANT COMPARISON:  CT abdomen and pelvis September 22, 2017 FINDINGS: Gallbladder: Surgically absent. Common bile duct: Diameter: 6 mm. No intrahepatic or extrahepatic biliary duct dilatation. Liver: No focal lesion identified. Within normal limits in parenchymal  echogenicity. Portal vein is patent on color Doppler imaging with normal direction of blood flow towards the liver. Right kidney appears echogenic. IMPRESSION: Gallbladder absent. Echogenic right kidney. Question a degree of underlying medical renal disease. Study otherwise unremarkable. Electronically Signed   By: Lowella Grip III M.D.   On: 09/22/2017 13:46     ASSESSMENT AND PLAN:   77 year old female with chronic kidney disease stage III with baseline creatinine of 2.2 diabetes who presents with abdominal pain, nausea and vomiting.  1.  Abdominal pain, nausea and vomiting: Nausea and vomiting have improved. Plan for EGD today  Continue supportive management   2.  Diabetes: Continue sliding scale and ADA diet  3.  Essential hypertension: Continue Norvasc and metoprolol  4.  Chronic kidney disease stage III with baseline creatinine of 2.2 Nephrology consultation requested  5.  Hyperkalemia: Has been treated.   6.  Acute hypoxic respiratory failure with possible pulmonary edema: Lasix 20 IV twice daily Echocardiogram Wean oxygen to room air   Management plans discussed with the patient and she is in agreement.  CODE STATUS: full  TOTAL TIME TAKING CARE OF THIS PATIENT: 27 minutes.     POSSIBLE D/C tomorrow, DEPENDING ON CLINICAL CONDITION.   Skyllar Notarianni M.D on 09/24/2017 at 10:20 AM  Between 7am to 6pm - Pager - [phone] After 6pm go to [email or web address] - password [email or web address] [facility]  Office  [phone]  CC: Primary care physician; [facility] [place]  Note: This dictation was prepared with Diplomatic Services operational officer dictation along with smaller phrase technology. Any transcriptional errors that result from this process are unintentional.

## 2017-09-24 NOTE — Anesthesia Preprocedure Evaluation (Signed)
Anesthesia Evaluation  Patient identified by MRN, date of birth, ID band Patient awake    Reviewed: Allergy & Precautions, NPO status , Patient's Chart, lab work & pertinent test results  History of Anesthesia Complications Negative for: history of anesthetic complications  Airway Mallampati: III       Dental   Pulmonary neg sleep apnea, neg COPD,           Cardiovascular hypertension, Pt. on medications and Pt. on home beta blockers (-) Past MI and (-) CHF (-) dysrhythmias (-) Valvular Problems/Murmurs     Neuro/Psych neg Seizures Glaucoma, legally blind    GI/Hepatic neg GERD  ,  Endo/Other  diabetes (borderline)  Renal/GU Renal InsufficiencyRenal disease     Musculoskeletal   Abdominal   Peds  Hematology   Anesthesia Other Findings   Reproductive/Obstetrics                             Anesthesia Physical Anesthesia Plan  ASA: III  Anesthesia Plan: General   Post-op Pain Management:    Induction: Intravenous  PONV Risk Score and Plan: 3  Airway Management Planned: Nasal Cannula  Additional Equipment:   Intra-op Plan:   Post-operative Plan:   Informed Consent: I have reviewed the patients History and Physical, chart, labs and discussed the procedure including the risks, benefits and alternatives for the proposed anesthesia with the patient or authorized representative who has indicated his/her understanding and acceptance.     Plan Discussed with:   Anesthesia Plan Comments:         Anesthesia Quick Evaluation

## 2017-09-24 NOTE — Anesthesia Procedure Notes (Signed)
Date/Time: 09/24/2017 2:00 PM Performed by: Doreen Salvage, CRNA Pre-anesthesia Checklist: Patient identified, Emergency Drugs available, Suction available and Patient being monitored Patient Re-evaluated:Patient Re-evaluated prior to induction Oxygen Delivery Method: Nasal cannula Induction Type: IV induction Dental Injury: Teeth and Oropharynx as per pre-operative assessment  Comments: Nasal cannula with etCO2 monitoring

## 2017-09-25 ENCOUNTER — Encounter: Payer: Self-pay | Admitting: Internal Medicine

## 2017-09-25 ENCOUNTER — Inpatient Hospital Stay: Payer: Medicare Other

## 2017-09-25 LAB — PHOSPHORUS: PHOSPHORUS: 4.6 mg/dL (ref 2.5–4.6)

## 2017-09-25 LAB — COMPREHENSIVE METABOLIC PANEL
ALBUMIN: 3.1 g/dL — AB (ref 3.5–5.0)
ALT: 23 U/L (ref 0–44)
AST: 27 U/L (ref 15–41)
Alkaline Phosphatase: 104 U/L (ref 38–126)
Anion gap: 9 (ref 5–15)
BUN: 44 mg/dL — AB (ref 8–23)
CHLORIDE: 105 mmol/L (ref 98–111)
CO2: 24 mmol/L (ref 22–32)
CREATININE: 2.45 mg/dL — AB (ref 0.44–1.00)
Calcium: 8.1 mg/dL — ABNORMAL LOW (ref 8.9–10.3)
GFR calc Af Amer: 21 mL/min — ABNORMAL LOW (ref >60)
GFR, EST NON AFRICAN AMERICAN: 18 mL/min — AB (ref >60)
Glucose, Bld: 111 mg/dL — ABNORMAL HIGH (ref 70–99)
POTASSIUM: 4.6 mmol/L (ref 3.5–5.1)
SODIUM: 138 mmol/L (ref 135–145)
Total Bilirubin: 0.5 mg/dL (ref 0.3–1.2)
Total Protein: 7 g/dL (ref 6.5–8.1)

## 2017-09-25 LAB — GLUCOSE, CAPILLARY
GLUCOSE-CAPILLARY: 90 mg/dL (ref 70–99)
Glucose-Capillary: 130 mg/dL — ABNORMAL HIGH (ref 70–99)

## 2017-09-25 LAB — ECHOCARDIOGRAM COMPLETE
HEIGHTINCHES: 60 in
WEIGHTICAEL: 2843.2 [oz_av]

## 2017-09-25 MED ORDER — DOCUSATE SODIUM 100 MG PO CAPS: 100.0000 mg | ORAL_CAPSULE | Freq: Two times a day (BID) | ORAL | Status: DC

## 2017-09-25 MED ORDER — POLYETHYLENE GLYCOL 3350 17 G PO PACK: 17.0000 g | PACK | Freq: Every day | ORAL | 0 refills | Status: DC

## 2017-09-25 MED ORDER — POLYETHYLENE GLYCOL 3350 17 G PO PACK: 17.0000 g | PACK | Freq: Every day | ORAL | Status: DC

## 2017-09-25 MED ORDER — FUROSEMIDE 40 MG PO TABS: 40.0000 mg | ORAL_TABLET | Freq: Every day | ORAL | 0 refills | Status: DC

## 2017-09-25 MED ORDER — PANTOPRAZOLE SODIUM 40 MG PO TBEC: 40.0000 mg | DELAYED_RELEASE_TABLET | Freq: Every day | ORAL | 0 refills | Status: DC

## 2017-09-25 NOTE — Progress Notes (Signed)
*  PRELIMINARY RESULTS* Echocardiogram 2D Echocardiogram has been performed.  Robin Rogers 09/25/2017, 6:59 AM

## 2017-09-25 NOTE — Anesthesia Postprocedure Evaluation (Signed)
Anesthesia Post Note  Patient: Robin Rogers  Procedure(s) Performed: ESOPHAGOGASTRODUODENOSCOPY (EGD) (N/A )  Patient location during evaluation: Endoscopy Anesthesia Type: General Level of consciousness: awake and alert Pain management: pain level controlled Vital Signs Assessment: post-procedure vital signs reviewed and stable Respiratory status: spontaneous breathing and respiratory function stable Cardiovascular status: stable Anesthetic complications: no     Last Vitals:  Vitals:   09/24/17 1959 09/25/17 0407  BP: (!) 116/52 (!) 128/56  Pulse: 79 73  Resp: 17 16  Temp: 36.7 C 36.7 C  SpO2: 92% (!) 81%    Last Pain:  Vitals:   09/25/17 0942  TempSrc:   PainSc: 0-No pain                 [name] K

## 2017-09-25 NOTE — Discharge Summary (Addendum)
[facility] [place] NAME: Robin Rogers    MR#:  [id number]  DATE OF BIRTH:  [birth year]  DATE OF ADMISSION:  09/22/2017 ADMITTING PHYSICIAN: Henreitta Leber, MD  DATE OF DISCHARGE: 09/25/2017  PRIMARY CARE PHYSICIAN: [facility] [place]    ADMISSION DIAGNOSIS:  Epigastric abdominal pain [R10.13] Intractable vomiting with nausea, unspecified vomiting type [R11.2]  DISCHARGE DIAGNOSIS:  Active Problems:   Intractable nausea and vomiting   SECONDARY DIAGNOSIS:   Past Medical History:  Diagnosis Date  . CHF (congestive heart failure) ([place])   . Diabetes mellitus without complication ([place])   . Hypertension   . Renal disorder     HOSPITAL COURSE:  77 year old female with history of diabetes and essential hypertension with chronic kidney disease stage III who presented with abdominal pain, nausea and vomiting.  1.  Abdominal pain with nausea vomiting: Her symptoms have subsided.  She underwent extensive work-up including CT scan of the abdomen and right upper quadrant ultrasound all of which were unrevealing.  KUB showed no evidence of obstruction.  She underwent EGD which showed some gastritis.   Abdominal pain is thought to be due to constipation.  Her symptoms have resolved and she is tolerating diet  2.  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  She will be discharged on oral Lasix and will defer to CHF clinic upon discharge.  3.  Diabetes: Patient will continue outpatient regimen with ADA diet  4. Chronic kidney disease stage III: Patient was eval by nephrology.  Her creatinine remains near baseline of 2.2-2.4.  She will have follow-up in 3 days with nephrology.  She should continue to avoid nephrotoxic  medications.   5.  Essential hypertension: She will continue metoprolol and Norvasc  DISCHARGE CONDITIONS AND DIET:   Stable for discharge on heart healthy diabetic diet  CONSULTS OBTAINED:  Treatment Team:  Efrain Sella, MD Lavonia Dana, MD  DRUG ALLERGIES:  No Known Allergies  DISCHARGE MEDICATIONS:   Allergies as of 09/25/2017   No Known Allergies     Medication List    STOP taking these medications   omeprazole 40 MG capsule Commonly known as:  PRILOSEC     TAKE these medications   amLODipine 10 MG tablet Commonly known as:  NORVASC Take 1 tablet by mouth daily.   aspirin EC 81 MG tablet Take 81 mg by mouth daily.   carboxymethylcellulose 0.5 % Soln Commonly known as:  REFRESH PLUS Place 1 drop into the right eye 4 (four) times daily.   ferrous sulfate 325 (65 FE) MG tablet Take 325 mg by mouth 2 (two) times daily.   furosemide 40 MG tablet Commonly known as:  LASIX Take 1 tablet (40 mg total) by mouth daily.   lovastatin 40 MG tablet Commonly known as:  MEVACOR Take 1 tablet by mouth at bedtime.   metoprolol tartrate 25 MG tablet Commonly known as:  LOPRESSOR Take 1 tablet by mouth 2 (two) times daily.   pantoprazole 40 MG tablet Commonly known as:  PROTONIX Take 1 tablet (40 mg total) by mouth daily. Start taking on:  09/26/2017   polyethylene glycol packet Commonly known as:  MIRALAX / GLYCOLAX Take 17 g by mouth daily.   prednisoLONE acetate 1 % ophthalmic suspension Commonly known as:  PRED FORTE  Place 1 drop into the left eye 2 (two) times daily as needed.   Vitamin D (Ergocalciferol) 50000 units Caps capsule Commonly known as:  DRISDOL Take 50,000 Units by mouth every 7 (seven) days.         Today   CHIEF COMPLAINT:   Patient feels constipated   VITAL SIGNS:  Blood pressure (!) 128/56, pulse 73, temperature 98.1 F (36.7 C), temperature source Oral, resp. rate 16, height 5' (1.524 m), weight 80.6 kg, SpO2 (!) 81  %.   REVIEW OF SYSTEMS:  Review of Systems  Constitutional: Negative.  Negative for chills, fever and malaise/fatigue.  HENT: Negative.  Negative for ear discharge, ear pain, hearing loss, nosebleeds and sore throat.   Eyes: Negative.  Negative for blurred vision and pain.  Respiratory: Negative.  Negative for cough, hemoptysis, shortness of breath and wheezing.   Cardiovascular: Negative.  Negative for chest pain, palpitations and leg swelling.  Gastrointestinal: Positive for constipation. Negative for abdominal pain, blood in stool, diarrhea, nausea and vomiting.  Genitourinary: Negative.  Negative for dysuria.  Musculoskeletal: Negative.  Negative for back pain.  Skin: Negative.   Neurological: Negative for dizziness, tremors, speech change, focal weakness, seizures and headaches.  Endo/Heme/Allergies: Negative.  Does not bruise/bleed easily.  Psychiatric/Behavioral: Negative.  Negative for depression, hallucinations and suicidal ideas.     PHYSICAL EXAMINATION:  GENERAL:  77 y.o.-year-old patient lying in the bed with no acute distress.  Obese NECK:  Supple, no jugular venous distention. No thyroid enlargement, no tenderness.  LUNGS: Normal breath sounds bilaterally, no wheezing, rales,rhonchi  No use of accessory muscles of respiration.  CARDIOVASCULAR: S1, S2 normal. No murmurs, rubs, or gallops.  ABDOMEN: Soft, non-tender, non-distended. Bowel sounds present. No organomegaly or mass.  EXTREMITIES: No pedal edema, cyanosis, or clubbing.  PSYCHIATRIC: The patient is alert and oriented x 3.  SKIN: No obvious rash, lesion, or ulcer.   DATA REVIEW:   CBC Recent Labs  Lab 09/23/17 0420  WBC 5.2  HGB 8.0*  HCT 23.3*  PLT 231    Chemistries  Recent Labs  Lab 09/25/17 0508  NA 138  K 4.6  CL 105  CO2 24  GLUCOSE 111*  BUN 44*  CREATININE 2.45*  CALCIUM 8.1*  AST 27  ALT 23  ALKPHOS 104  BILITOT 0.5    Cardiac Enzymes Recent Labs  Lab 09/22/17 1122  09/24/17 0017  TROPONINI <0.03 <0.03    Microbiology Results  @[email or web address] @  RADIOLOGY:  Dg Chest 1 View  Result Date: 09/25/2017 CLINICAL DATA:  CHF EXAM: CHEST  1 VIEW COMPARISON:  09/22/2017 FINDINGS: Cardiac shadow is enlarged. Aortic calcifications are again seen. Central vascular congestion is again identified and stable. Small right pleural effusion is again identified. Mild increased density is noted along the lateral aspect of the left lung which may represent some loculated fluid as well. IMPRESSION: Stable cardiomegaly and vascular congestion. Slight increase in bilateral pleural effusions. Electronically Signed   By: Inez Catalina M.D.   On: 09/25/2017 07:08   Dg Abd 1 View  Result Date: 09/23/2017 CLINICAL DATA:  77 year old female with generalized and left upper quadrant abdominal pain for several days. EXAM: ABDOMEN - 1 VIEW COMPARISON:  CT Abdomen and Pelvis 09/22/2017 and earlier. FINDINGS: AP supine views at 0944 hours. Stable cholecystectomy clips. The Non obstructed bowel gas pattern. Little retained stool in the colon as demonstrated by CT yesterday. Partially visible cardiomegaly. No acute osseous abnormality identified. IMPRESSION: Stable and normal bowel gas pattern.  Electronically Signed   By: Genevie Ann M.D.   On: 09/23/2017 10:30      Allergies as of 09/25/2017   No Known Allergies     Medication List    STOP taking these medications   omeprazole 40 MG capsule Commonly known as:  PRILOSEC     TAKE these medications   amLODipine 10 MG tablet Commonly known as:  NORVASC Take 1 tablet by mouth daily.   aspirin EC 81 MG tablet Take 81 mg by mouth daily.   carboxymethylcellulose 0.5 % Soln Commonly known as:  REFRESH PLUS Place 1 drop into the right eye 4 (four) times daily.   ferrous sulfate 325 (65 FE) MG tablet Take 325 mg by mouth 2 (two) times daily.   furosemide 40 MG tablet Commonly known as:  LASIX Take 1 tablet (40 mg total) by mouth daily.    lovastatin 40 MG tablet Commonly known as:  MEVACOR Take 1 tablet by mouth at bedtime.   metoprolol tartrate 25 MG tablet Commonly known as:  LOPRESSOR Take 1 tablet by mouth 2 (two) times daily.   pantoprazole 40 MG tablet Commonly known as:  PROTONIX Take 1 tablet (40 mg total) by mouth daily. Start taking on:  09/26/2017   polyethylene glycol packet Commonly known as:  MIRALAX / GLYCOLAX Take 17 g by mouth daily.   prednisoLONE acetate 1 % ophthalmic suspension Commonly known as:  PRED FORTE Place 1 drop into the left eye 2 (two) times daily as needed.   Vitamin D (Ergocalciferol) 50000 units Caps capsule Commonly known as:  DRISDOL Take 50,000 Units by mouth every 7 (seven) days.          Management plans discussed with the patient and she is in agreement. Stable for discharge   Patient should follow up with pcp  CODE STATUS:     Code Status Orders  (From admission, onward)         Start     Ordered   09/22/17 2132  Full code  Continuous     09/22/17 2131        Code Status History    This patient has a current code status but no historical code status.      TOTAL TIME TAKING CARE OF THIS PATIENT: 38 minutes.    Note: This dictation was prepared with Dragon dictation along with smaller phrase technology. Any transcriptional errors that result from this process are unintentional.  Destenie Ingber M.D on 09/25/2017 at 9:14 AM  Between 7am to 6pm - Pager - [phone] After 6pm go to [email or web address] - password [email or web address] [facility]  Office  [phone]  CC: Primary care physician; [facility] [place]

## 2017-09-25 NOTE — Progress Notes (Signed)
Patient discharged home per MD order. All discharge instructions given using the interpreter and all questions answered. Prescriptions given to patient.

## 2017-09-25 NOTE — Care Management (Signed)
Discussed Home Health services with daughter Janace Hoard [phone]). States that her mother would need an interpreter and she would like the home health agency to call her to arrange appointment times.  Spoke with Floydene Flock, [facility] can provide an interpreter. Discharge to home today per Dr. Mirna Mires RN MSN CCM Care Management [phone]

## 2017-09-26 LAB — PARATHYROID HORMONE, INTACT (NO CA): PTH: 129 pg/mL — AB (ref 15–65)

## 2017-09-26 LAB — HIV ANTIBODY (ROUTINE TESTING W REFLEX): HIV SCREEN 4TH GENERATION: NONREACTIVE

## 2017-09-27 LAB — HEPATITIS B SURFACE ANTIBODY,QUALITATIVE: Hep B S Ab: NONREACTIVE

## 2017-09-27 LAB — HEPATITIS B CORE ANTIBODY, IGM: HEP B C IGM: NEGATIVE

## 2017-09-27 LAB — HEPATITIS C ANTIBODY: HCV Ab: <0.1 s/co ratio (ref 0.0–0.9)

## 2017-09-27 LAB — HEPATITIS B SURFACE ANTIGEN: Hepatitis B Surface Ag: NEGATIVE

## 2017-09-28 LAB — KAPPA/LAMBDA LIGHT CHAINS
KAPPA FREE LGHT CHN: 110.4 mg/L — AB (ref 3.3–19.4)
KAPPA, LAMDA LIGHT CHAIN RATIO: 1.06 (ref 0.26–1.65)
Lambda free light chains: 104.5 mg/L — ABNORMAL HIGH (ref 5.7–26.3)

## 2017-09-28 LAB — PROTEIN ELECTRO, RANDOM URINE
ALBUMIN ELP UR: 61.1 %
ALPHA-2-GLOBULIN, U: 8.2 %
Alpha-1-Globulin, U: 0.9 %
BETA GLOBULIN, U: 10 %
Gamma Globulin, U: 19.8 %
TOTAL PROTEIN, URINE-UPE24: 284.4 mg/dL

## 2017-09-28 LAB — SURGICAL PATHOLOGY

## 2017-09-28 LAB — PROTEIN ELECTROPHORESIS, SERUM
A/G RATIO SPE: 0.8 (ref 0.7–1.7)
ALBUMIN ELP: 2.8 g/dL — AB (ref 2.9–4.4)
ALPHA-1-GLOBULIN: 0.3 g/dL (ref 0.0–0.4)
Alpha-2-Globulin: 0.8 g/dL (ref 0.4–1.0)
BETA GLOBULIN: 0.9 g/dL (ref 0.7–1.3)
GAMMA GLOBULIN: 1.5 g/dL (ref 0.4–1.8)
Globulin, Total: 3.4 g/dL (ref 2.2–3.9)
TOTAL PROTEIN ELP: 6.2 g/dL (ref 6.0–8.5)

## 2017-10-08 ENCOUNTER — Ambulatory Visit: Payer: Medicare Other | Admitting: Family

## 2017-10-08 NOTE — Progress Notes (Signed)
Patient ID: Robin Rogers, female    DOB: 1940/06/03, 77 y.o.   MRN: [id number]  HPI  **entire visit was done with [facility] interpreter present**  Robin Rogers is a 77 y/o female with a history of DM, HTN, CKD, blindness and chronic heart failure.   Echo report from 09/25/17 reviewed and showed an EF of 45-50% along with mild AR and moderate MR.   Admitted 09/22/17 due to abdominal pain along with nausea/vomiting in addition to HF exacerbation. GI consult obtained. Abdominal ultrasound, KUB and CT were negative. EGD done which showed gastritis. Discharged after 3 days. Was in the ED 07/20/17 due to left upper quadrant pain where she was treated and released.  She presents today for her initial visit with a chief complaint of minimal fatigue upon moderate exertion. She describes this as chronic in nature having been present for several years. She doesn't have any associated symptoms and denies difficulty sleeping, abdominal distention, palpitations, pedal edema, chest pain, shortness of breath or dizziness. She currently does not have any scales. She is completely blind.   Past Medical History:  Diagnosis Date  . Blindness   . CHF (congestive heart failure) ([place])   . Diabetes mellitus without complication ([place])   . Hypertension   . Renal disorder    Past Surgical History:  Procedure Laterality Date  . CHOLECYSTECTOMY    . ESOPHAGOGASTRODUODENOSCOPY N/A 09/24/2017   Procedure: ESOPHAGOGASTRODUODENOSCOPY (EGD);  Surgeon: Toledo, Benay Pike, MD;  Location: [facility] ENDOSCOPY;  Service: Gastroenterology;  Laterality: N/A;   History reviewed. No pertinent family history. Social History   Tobacco Use  . Smoking status: Never Smoker  . Smokeless tobacco: Never Used  Substance Use Topics  . Alcohol use: No   No Known Allergies Prior to Admission medications   Medication Sig Start Date End Date Taking? Authorizing Provider  amLODipine (NORVASC) 10 MG tablet Take 1 tablet by mouth daily. 02/27/17   Yes [provider]  aspirin EC 81 MG tablet Take 81 mg by mouth daily.   Yes [provider]  carboxymethylcellulose (REFRESH PLUS) 0.5 % SOLN Place 1 drop into the right eye 4 (four) times daily.   Yes [provider]  ferrous sulfate 325 (65 FE) MG tablet Take 325 mg by mouth 2 (two) times daily.   Yes [provider]  furosemide (LASIX) 40 MG tablet Take 40 mg by mouth as needed.   Yes [provider]  lovastatin (MEVACOR) 40 MG tablet Take 1 tablet by mouth at bedtime.  01/21/17  Yes [provider]  metoprolol tartrate (LOPRESSOR) 25 MG tablet Take 1 tablet by mouth 2 (two) times daily. 12/02/16  Yes [provider]  pantoprazole (PROTONIX) 40 MG tablet Take 1 tablet (40 mg total) by mouth daily. Patient taking differently: Take 40 mg by mouth as needed.  09/26/17 10/25/17 Yes Mody, Ulice Bold, MD  polyethylene glycol (MIRALAX / GLYCOLAX) packet Take 17 g by mouth daily. 09/25/17  Yes Bettey Costa, MD  prednisoLONE acetate (PRED FORTE) 1 % ophthalmic suspension Place 1 drop into the left eye 2 (two) times daily as needed.   Yes [provider]  Vitamin D, Ergocalciferol, (DRISDOL) 50000 units CAPS capsule Take 50,000 Units by mouth every 7 (seven) days.   Yes [provider]    Review of Systems  Constitutional: Positive for fatigue (with moderate exertion).  HENT: Negative for congestion, postnasal drip and sore throat.   Eyes: Positive for visual disturbance (blind).  Respiratory: Negative  for chest tightness and shortness of breath.   Cardiovascular: Negative for chest pain, palpitations and leg swelling.  Gastrointestinal: Negative for abdominal distention and abdominal pain.  Endocrine: Negative.   Genitourinary: Negative.   Musculoskeletal: Negative for back pain and neck pain.  Skin: Negative.   Allergic/Immunologic: Negative.   Neurological: Negative for dizziness and light-headedness.  Hematological:  Negative for adenopathy. Does not bruise/bleed easily.  Psychiatric/Behavioral: Negative for dysphoric mood and sleep disturbance (sleeping on 1 pillow). The patient is not nervous/anxious.    Vitals:   10/09/17 1049  BP: (!) 147/52  Pulse: 65  Resp: 18  SpO2: 99%  Weight: 170 lb 2 oz (77.2 kg)  Height: 5' (1.524 m)   Wt Readings from Last 3 Encounters:  10/09/17 170 lb 2 oz (77.2 kg)  09/22/17 177 lb 11.2 oz (80.6 kg)  07/20/17 170 lb (77.1 kg)   Lab Results  Component Value Date   CREATININE 2.45 (H) 09/25/2017   CREATININE 2.41 (H) 09/24/2017   CREATININE 2.25 (H) 09/23/2017    Physical Exam  Constitutional: She is oriented to person, place, and time. She appears well-developed and well-nourished.  HENT:  Head: Normocephalic and atraumatic.  Neck: Normal range of motion. Neck supple. No JVD present.  Cardiovascular: Normal rate and regular rhythm.  Pulmonary/Chest: Effort normal. No respiratory distress. She has no wheezes. She has no rales.  Abdominal: Soft. She exhibits no distension. There is no tenderness.  Musculoskeletal: She exhibits no edema or tenderness.  Neurological: She is alert and oriented to person, place, and time.  Skin: Skin is warm and dry.  Psychiatric: She has a normal mood and affect. Her behavior is normal. Thought content normal.  Nursing note and vitals reviewed.   Assessment & Plan:   1: Chronic heart failure with reduced ejection fraction- - NYHA class II - euvolemic today - scales given to her today and she was instructed to call for an overnight weight gain of >2 pounds or a weekly weight gain of >5 pounds - she is not adding salt and daughter with her says that they cook everything from scratch. Encouraged them to closely follow a 2000mg  sodium diet which should not be an issue if everything is homemade - EF is >40% so would not qualify for entresto - need to discussed changing metoprolol tartrate to metoprolol succinate at her next  visit - BNP 09/22/17 was 457.0  2: HTN- - BP mildly elevated today - to take furosemide PRN for weight gain or edema - follows with PCP at [facility] 09/25/17 reviewed and showed sodium 138, potassium 4.6, creatinine 2.45 and GFR 18  3: DM with CKD- - A1c 09/22/17 was 5.7% - currently diet controlled - needs nephrology referral to consider using ACE-I or ARB  Medication bottles were reviewed.  Return in 1 month or sooner for any questions/problems before then.

## 2017-10-09 ENCOUNTER — Ambulatory Visit: Payer: Medicare Other | Attending: Family | Admitting: Family

## 2017-10-09 ENCOUNTER — Encounter: Payer: Self-pay | Admitting: Family

## 2017-10-09 DIAGNOSIS — I509 Heart failure, unspecified: Secondary | ICD-10-CM | POA: Diagnosis not present

## 2017-10-09 DIAGNOSIS — E1122 Type 2 diabetes mellitus with diabetic chronic kidney disease: Secondary | ICD-10-CM | POA: Diagnosis not present

## 2017-10-09 DIAGNOSIS — I130 Hypertensive heart and chronic kidney disease with heart failure and stage 1 through stage 4 chronic kidney disease, or unspecified chronic kidney disease: Secondary | ICD-10-CM | POA: Diagnosis not present

## 2017-10-09 DIAGNOSIS — I10 Essential (primary) hypertension: Secondary | ICD-10-CM

## 2017-10-09 DIAGNOSIS — H547 Unspecified visual loss: Secondary | ICD-10-CM | POA: Diagnosis not present

## 2017-10-09 DIAGNOSIS — Z7982 Long term (current) use of aspirin: Secondary | ICD-10-CM | POA: Diagnosis not present

## 2017-10-09 DIAGNOSIS — E119 Type 2 diabetes mellitus without complications: Secondary | ICD-10-CM | POA: Insufficient documentation

## 2017-10-09 DIAGNOSIS — I5042 Chronic combined systolic (congestive) and diastolic (congestive) heart failure: Secondary | ICD-10-CM | POA: Insufficient documentation

## 2017-10-09 DIAGNOSIS — I5022 Chronic systolic (congestive) heart failure: Secondary | ICD-10-CM

## 2017-10-09 DIAGNOSIS — Z79899 Other long term (current) drug therapy: Secondary | ICD-10-CM | POA: Insufficient documentation

## 2017-10-09 DIAGNOSIS — N189 Chronic kidney disease, unspecified: Secondary | ICD-10-CM | POA: Diagnosis not present

## 2017-10-09 DIAGNOSIS — N184 Chronic kidney disease, stage 4 (severe): Secondary | ICD-10-CM

## 2017-10-09 NOTE — Patient Instructions (Addendum)
Begin weighing daily and call for an overnight weight gain of > 2 pounds or a weekly weight gain of >5 pounds. Empiece a pesarce diariamente. Sigana mas de 2 libras por dia o 5 lbs por semana, llamenos   Take furosemide (lasix) as needed for above weight gain or swelling in legs or abdomen.  Tome Furosemide ( lasix) si lo necesita cuando suba de [place], o se le inflamen las piernas o el estomago

## 2017-11-06 ENCOUNTER — Telehealth: Payer: Self-pay | Admitting: Family

## 2017-11-06 ENCOUNTER — Ambulatory Visit: Payer: Medicare Other | Admitting: Family

## 2017-11-06 NOTE — Telephone Encounter (Signed)
Patient did not show for her [facility] Failure [facility] appointment on 11/06/17. Will attempt to reschedule.

## 2019-05-10 ENCOUNTER — Inpatient Hospital Stay
Admission: EM | Admit: 2019-05-10 | Discharge: 2019-05-16 | DRG: 291 | Disposition: A | Discharge status: Home / Self Care | Payer: Medicare Other | Attending: Internal Medicine | Admitting: Internal Medicine

## 2019-05-10 ENCOUNTER — Emergency Department: Payer: Medicare Other

## 2019-05-10 ENCOUNTER — Other Ambulatory Visit: Payer: Self-pay

## 2019-05-10 DIAGNOSIS — N172 Acute kidney failure with medullary necrosis: Secondary | ICD-10-CM | POA: Diagnosis not present

## 2019-05-10 DIAGNOSIS — I10 Essential (primary) hypertension: Secondary | ICD-10-CM | POA: Diagnosis not present

## 2019-05-10 DIAGNOSIS — I5042 Chronic combined systolic (congestive) and diastolic (congestive) heart failure: Secondary | ICD-10-CM | POA: Diagnosis present

## 2019-05-10 DIAGNOSIS — D631 Anemia in chronic kidney disease: Secondary | ICD-10-CM | POA: Diagnosis present

## 2019-05-10 DIAGNOSIS — K5900 Constipation, unspecified: Secondary | ICD-10-CM | POA: Diagnosis present

## 2019-05-10 DIAGNOSIS — E1151 Type 2 diabetes mellitus with diabetic peripheral angiopathy without gangrene: Secondary | ICD-10-CM | POA: Diagnosis present

## 2019-05-10 DIAGNOSIS — I132 Hypertensive heart and chronic kidney disease with heart failure and with stage 5 chronic kidney disease, or end stage renal disease: Secondary | ICD-10-CM | POA: Diagnosis present

## 2019-05-10 DIAGNOSIS — I5022 Chronic systolic (congestive) heart failure: Secondary | ICD-10-CM | POA: Diagnosis not present

## 2019-05-10 DIAGNOSIS — E119 Type 2 diabetes mellitus without complications: Secondary | ICD-10-CM

## 2019-05-10 DIAGNOSIS — I5043 Acute on chronic combined systolic (congestive) and diastolic (congestive) heart failure: Secondary | ICD-10-CM | POA: Diagnosis present

## 2019-05-10 DIAGNOSIS — R0602 Shortness of breath: Secondary | ICD-10-CM | POA: Diagnosis present

## 2019-05-10 DIAGNOSIS — N179 Acute kidney failure, unspecified: Secondary | ICD-10-CM | POA: Diagnosis present

## 2019-05-10 DIAGNOSIS — E1122 Type 2 diabetes mellitus with diabetic chronic kidney disease: Secondary | ICD-10-CM | POA: Diagnosis present

## 2019-05-10 DIAGNOSIS — Z79899 Other long term (current) drug therapy: Secondary | ICD-10-CM | POA: Diagnosis not present

## 2019-05-10 DIAGNOSIS — I110 Hypertensive heart disease with heart failure: Secondary | ICD-10-CM | POA: Diagnosis not present

## 2019-05-10 DIAGNOSIS — N184 Chronic kidney disease, stage 4 (severe): Secondary | ICD-10-CM | POA: Diagnosis not present

## 2019-05-10 DIAGNOSIS — I420 Dilated cardiomyopathy: Secondary | ICD-10-CM | POA: Diagnosis present

## 2019-05-10 DIAGNOSIS — N2581 Secondary hyperparathyroidism of renal origin: Secondary | ICD-10-CM | POA: Diagnosis present

## 2019-05-10 DIAGNOSIS — Z20822 Contact with and (suspected) exposure to covid-19: Secondary | ICD-10-CM | POA: Diagnosis present

## 2019-05-10 DIAGNOSIS — Z7982 Long term (current) use of aspirin: Secondary | ICD-10-CM | POA: Diagnosis not present

## 2019-05-10 DIAGNOSIS — D649 Anemia, unspecified: Secondary | ICD-10-CM | POA: Diagnosis not present

## 2019-05-10 DIAGNOSIS — I5021 Acute systolic (congestive) heart failure: Secondary | ICD-10-CM | POA: Diagnosis not present

## 2019-05-10 DIAGNOSIS — R630 Anorexia: Secondary | ICD-10-CM | POA: Diagnosis present

## 2019-05-10 DIAGNOSIS — E1165 Type 2 diabetes mellitus with hyperglycemia: Secondary | ICD-10-CM

## 2019-05-10 DIAGNOSIS — N185 Chronic kidney disease, stage 5: Secondary | ICD-10-CM | POA: Diagnosis present

## 2019-05-10 DIAGNOSIS — H547 Unspecified visual loss: Secondary | ICD-10-CM | POA: Diagnosis present

## 2019-05-10 HISTORY — DX: Chronic combined systolic (congestive) and diastolic (congestive) heart failure: I50.42

## 2019-05-10 HISTORY — DX: Type 2 diabetes mellitus without complications: E11.9

## 2019-05-10 HISTORY — DX: Chronic kidney disease, stage 4 (severe): N18.4

## 2019-05-10 HISTORY — DX: Anemia in other chronic diseases classified elsewhere: D63.8

## 2019-05-10 LAB — RESPIRATORY PANEL BY RT PCR (FLU A&B, COVID)
Influenza A by PCR: NEGATIVE
Influenza B by PCR: NEGATIVE
SARS Coronavirus 2 by RT PCR: NEGATIVE

## 2019-05-10 LAB — LIPASE, BLOOD: Lipase: 21 U/L (ref 11–51)

## 2019-05-10 LAB — CBC
HCT: 23.8 % — ABNORMAL LOW (ref 36.0–46.0)
Hemoglobin: 7.6 g/dL — ABNORMAL LOW (ref 12.0–15.0)
MCH: 32.1 pg (ref 26.0–34.0)
MCHC: 31.9 g/dL (ref 30.0–36.0)
MCV: 100.4 fL — ABNORMAL HIGH (ref 80.0–100.0)
Platelets: 215 10*3/uL (ref 150–400)
RBC: 2.37 MIL/uL — ABNORMAL LOW (ref 3.87–5.11)
RDW: 12.7 % (ref 11.5–15.5)
WBC: 5.3 10*3/uL (ref 4.0–10.5)
nRBC: 0 % (ref 0.0–0.2)

## 2019-05-10 LAB — BASIC METABOLIC PANEL
Anion gap: 8 (ref 5–15)
BUN: 59 mg/dL — ABNORMAL HIGH (ref 8–23)
CO2: 21 mmol/L — ABNORMAL LOW (ref 22–32)
Calcium: 7.2 mg/dL — ABNORMAL LOW (ref 8.9–10.3)
Chloride: 105 mmol/L (ref 98–111)
Creatinine, Ser: 4.31 mg/dL — ABNORMAL HIGH (ref 0.44–1.00)
GFR calc Af Amer: 11 mL/min — ABNORMAL LOW (ref >60)
GFR calc non Af Amer: 9 mL/min — ABNORMAL LOW (ref >60)
Glucose, Bld: 162 mg/dL — ABNORMAL HIGH (ref 70–99)
Potassium: 5 mmol/L (ref 3.5–5.1)
Sodium: 134 mmol/L — ABNORMAL LOW (ref 135–145)

## 2019-05-10 LAB — BRAIN NATRIURETIC PEPTIDE: B Natriuretic Peptide: 702 pg/mL — ABNORMAL HIGH (ref 0.0–100.0)

## 2019-05-10 LAB — TROPONIN I (HIGH SENSITIVITY)
Troponin I (High Sensitivity): 14 ng/L (ref <18)
Troponin I (High Sensitivity): 13 ng/L (ref <18)

## 2019-05-10 MED ORDER — ONDANSETRON HCL 4 MG/2ML IJ SOLN: 4.0000 mg | Freq: Four times a day (QID) | INTRAMUSCULAR | Status: DC | PRN

## 2019-05-10 MED ORDER — SODIUM CHLORIDE 0.9 % IV SOLN: 250.0000 mL | INTRAVENOUS | Status: DC | PRN

## 2019-05-10 MED ORDER — FUROSEMIDE 10 MG/ML IJ SOLN
60.0000 mg | Freq: Every day | INTRAMUSCULAR | Status: DC
  Administered 2019-05-11 – 2019-05-12 (×2): 60 mg via INTRAVENOUS
  Filled 2019-05-10 (×3): qty 6

## 2019-05-10 MED ORDER — HEPARIN SODIUM (PORCINE) 5000 UNIT/ML IJ SOLN
5000.0000 [IU] | Freq: Three times a day (TID) | INTRAMUSCULAR | Status: DC
  Administered 2019-05-10 – 2019-05-16 (×15): 5000 [IU] via SUBCUTANEOUS
  Filled 2019-05-10 (×16): qty 1

## 2019-05-10 MED ORDER — SODIUM CHLORIDE 0.9% FLUSH
3.0000 mL | Freq: Two times a day (BID) | INTRAVENOUS | Status: DC
  Administered 2019-05-10 – 2019-05-16 (×12): 3 mL via INTRAVENOUS

## 2019-05-10 MED ORDER — PRAVASTATIN SODIUM 40 MG PO TABS
40.0000 mg | ORAL_TABLET | Freq: Every day | ORAL | Status: DC
  Administered 2019-05-11 – 2019-05-15 (×5): 40 mg via ORAL
  Filled 2019-05-10 (×6): qty 1

## 2019-05-10 MED ORDER — FUROSEMIDE 10 MG/ML IJ SOLN
40.0000 mg | Freq: Once | INTRAMUSCULAR | Status: AC
  Administered 2019-05-10: 40 mg via INTRAVENOUS
  Filled 2019-05-10: qty 4

## 2019-05-10 MED ORDER — ACETAMINOPHEN 325 MG PO TABS: 650.0000 mg | ORAL_TABLET | ORAL | Status: DC | PRN

## 2019-05-10 MED ORDER — ASPIRIN EC 81 MG PO TBEC
81.0000 mg | DELAYED_RELEASE_TABLET | Freq: Every day | ORAL | Status: DC
  Administered 2019-05-11 – 2019-05-16 (×6): 81 mg via ORAL
  Filled 2019-05-10 (×6): qty 1

## 2019-05-10 MED ORDER — METOPROLOL TARTRATE 25 MG PO TABS
25.0000 mg | ORAL_TABLET | Freq: Two times a day (BID) | ORAL | Status: DC
  Administered 2019-05-10 – 2019-05-13 (×6): 25 mg via ORAL
  Filled 2019-05-10 (×6): qty 1

## 2019-05-10 MED ORDER — PANTOPRAZOLE SODIUM 40 MG PO TBEC
40.0000 mg | DELAYED_RELEASE_TABLET | Freq: Every day | ORAL | Status: DC
  Administered 2019-05-11 – 2019-05-16 (×6): 40 mg via ORAL
  Filled 2019-05-10 (×6): qty 1

## 2019-05-10 MED ORDER — SODIUM CHLORIDE 0.9% FLUSH: 3.0000 mL | INTRAVENOUS | Status: DC | PRN

## 2019-05-10 NOTE — H&P (Signed)
History and Physical:    Robin Rogers   GBE:[id number] DOB: 06-May-1940 DOA: 05/10/2019  Referring MD/provider: Dr. Joan Mayans PCP: [facility] [place]   Patient coming from: Home  Chief Complaint: Orthopnea.  Patient was seen in conjunction with her granddaughter who needed to translate due to an unusual dialect of [place].  History of Present Illness:   Robin Rogers is an 79 y.o. 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  Patient noted that she could not lay down flat because she was short of breath and felt improved when she sat back up.  Prior to this and during this time patient had no chest pain diaphoresis although she has been nauseated for the past 4 days.  Patient decided to come to the hospital today because she could not sleep at all last night because of inability to lay down due to shortness of breath.  Review of systems notable for nausea as noted above.  No vomiting.  She does admit to anorexia.  She is also noted new onset lower extremity edema for the past 3 days.  Patient denies any new medications other than what she is prescribed at the [facility].  Denies taking any NSAIDs.  Patient specifically denies any fevers or chills or cough or body aches or much malaise or myalgia.  ED Course:  The patient is noted to be a little bit short of breath but no hypoxia on room air.  Chest x-ray showed some mild interstitial vascular congestion.  BNP was noted to be 700.  She was noted to be in acute kidney failure with creatinine of 4.3 up from 2.4.  ROS:   ROS   Review of Systems: As per HPI  Past Medical History:   Past Medical History:  Diagnosis Date  . Blindness   . CHF (congestive heart  failure) ([place])   . Diabetes mellitus without complication ([place])   . Hypertension   . Renal disorder     Past Surgical History:   Past Surgical History:  Procedure Laterality Date  . CHOLECYSTECTOMY    . ESOPHAGOGASTRODUODENOSCOPY N/A 09/24/2017   Procedure: ESOPHAGOGASTRODUODENOSCOPY (EGD);  Surgeon: Toledo, Benay Pike, MD;  Location: [facility] ENDOSCOPY;  Service: Gastroenterology;  Laterality: N/A;    Social History:   Social History   Socioeconomic History  . Marital status: Married    Spouse name: Not on file  . Number of children: Not on file  . Years of education: Not on file  . Highest education level: Not on file  Occupational History  . Not on file  Tobacco Use  . Smoking status: Never Smoker  . Smokeless tobacco: Never Used  Substance and Sexual Activity  . Alcohol use: No  . Drug use: Never  . Sexual activity: Not Currently  Other Topics Concern  . Not on file  Social History Narrative  . Not on file   Social Determinants of Health   Financial Resource Strain:   . Difficulty of Paying Living Expenses:   Food Insecurity:   . Worried About Charity fundraiser in the Last Year:   . Arboriculturist in the Last Year:   Transportation Needs:   . Film/video editor (Medical):   Marland Kitchen  Lack of Transportation (Non-Medical):   Physical Activity:   . Days of Exercise per Week:   . Minutes of Exercise per Session:   Stress:   . Feeling of Stress :   Social Connections:   . Frequency of Communication with Friends and Family:   . Frequency of Social Gatherings with Friends and Family:   . Attends Religious Services:   . Active Member of Clubs or Organizations:   . Attends Archivist Meetings:   Marland Kitchen Marital Status:   Intimate Partner Violence:   . Fear of Current or Ex-Partner:   . Emotionally Abused:   Marland Kitchen Physically Abused:   . Sexually Abused:     Allergies   Patient has no known allergies.  Family history:   History reviewed. No pertinent family  history.  Current Medications:   Prior to Admission medications   Medication Sig Start Date End Date Taking? Authorizing Provider  amLODipine (NORVASC) 10 MG tablet Take 1 tablet by mouth daily. 02/27/17   [provider]  aspirin EC 81 MG tablet Take 81 mg by mouth daily.    [provider]  carboxymethylcellulose (REFRESH PLUS) 0.5 % SOLN Place 1 drop into the right eye 4 (four) times daily.    [provider]  ferrous sulfate 325 (65 FE) MG tablet Take 325 mg by mouth 2 (two) times daily.    [provider]  furosemide (LASIX) 40 MG tablet Take 40 mg by mouth as needed.    [provider]  lovastatin (MEVACOR) 40 MG tablet Take 1 tablet by mouth at bedtime.  01/21/17   [provider]  metoprolol tartrate (LOPRESSOR) 25 MG tablet Take 1 tablet by mouth 2 (two) times daily. 12/02/16   [provider]  pantoprazole (PROTONIX) 40 MG tablet Take 1 tablet (40 mg total) by mouth daily. Patient taking differently: Take 40 mg by mouth as needed.  09/26/17 10/25/17  Bettey Costa, MD  polyethylene glycol (MIRALAX / GLYCOLAX) packet Take 17 g by mouth daily. 09/25/17   Bettey Costa, MD  prednisoLONE acetate (PRED FORTE) 1 % ophthalmic suspension Place 1 drop into the left eye 2 (two) times daily as needed.    [provider]  Vitamin D, Ergocalciferol, (DRISDOL) 50000 units CAPS capsule Take 50,000 Units by mouth every 7 (seven) days.    [provider]    Physical Exam:   Vitals:   05/10/19 1543 05/10/19 1600 05/10/19 1630 05/10/19 1700  BP: (!) 155/65 (!) 144/66 (!) 146/59 (!) 135/58  Pulse: 95 87 79 76  Resp: 18 (!) 28 18 18   Temp:      TempSrc:      SpO2: 100% 98% 98% 97%  Weight:      Height:         Physical Exam: Blood pressure (!) 135/58, pulse 76, temperature 99.1 F (37.3 C), temperature source Oral, resp. rate 18, height 5\' 2"  (1.575 m), weight 79.4 kg, SpO2 97 %. Gen: Clearly blind female with corneal  opacification bilaterally sitting up in bed in no acute distress with attentive granddaughter at bedside.  She is breathing comfortably unlabored. Eyes: Bilateral corneal opacification. CVS: S1-S2, regulary, no gallops Respiratory: Reasonable air entry bilaterally she does have rales at bases left greater than right. GI: NABS, soft, NT  LE: 2+ edema bilaterally. Neuro: A/O x 3, Moving all extremities equally with normal strength, CN 3-12 intact, grossly nonfocal.  Psych: patient is logical and coherent, judgement and insight appear normal,  mood and affect appropriate to situation. Skin: no rashes or lesions or ulcers,    Data Review:    Labs: Basic Metabolic Panel: Recent Labs  Lab 05/10/19 1455  NA 134*  K 5.0  CL 105  CO2 21*  GLUCOSE 162*  BUN 59*  CREATININE 4.31*  CALCIUM 7.2*   Liver Function Tests: No results for input(s): AST, ALT, ALKPHOS, BILITOT, PROT, ALBUMIN in the last 168 hours. Recent Labs  Lab 05/10/19 1455  LIPASE 21   No results for input(s): AMMONIA in the last 168 hours. CBC: Recent Labs  Lab 05/10/19 1455  WBC 5.3  HGB 7.6*  HCT 23.8*  MCV 100.4*  PLT 215   Cardiac Enzymes: No results for input(s): CKTOTAL, CKMB, CKMBINDEX, TROPONINI in the last 168 hours.  BNP (last 3 results) No results for input(s): PROBNP in the last 8760 hours. CBG: No results for input(s): GLUCAP in the last 168 hours.  Urinalysis    Component Value Date/Time   COLORURINE STRAW (A) 09/22/2017 1413   APPEARANCEUR CLEAR (A) 09/22/2017 1413   LABSPEC 1.004 (L) 09/22/2017 1413   PHURINE 5.0 09/22/2017 1413   GLUCOSEU NEGATIVE 09/22/2017 1413   HGBUR SMALL (A) 09/22/2017 1413   BILIRUBINUR NEGATIVE 09/22/2017 1413   KETONESUR NEGATIVE 09/22/2017 1413   PROTEINUR 100 (A) 09/22/2017 1413   NITRITE NEGATIVE 09/22/2017 1413   LEUKOCYTESUR NEGATIVE 09/22/2017 1413      Radiographic Studies: DG Chest Port 1 View  Result Date: 05/10/2019 CLINICAL DATA:   Shortness of breath. EXAM: PORTABLE CHEST 1 VIEW COMPARISON:  09/25/2017.  09/22/2017. FINDINGS: Severe cardiomegaly again noted. Mild pulmonary venous congestion. Mild interstitial prominence. Small bilateral pleural effusions. Mild CHF cannot be excluded. Bibasilar subsegmental atelectasis. Degenerative changes scoliosis thoracic spine. IMPRESSION: One cardiomegaly. Mild pulmonary venous congestion and mild pulmonary interstitial prominence. Small bilateral pleural effusions. Mild CHF cannot be excluded. 2.  Bibasilar subsegmental atelectasis. Electronically Signed   By: Marcello Moores  Register   On: 05/10/2019 16:06    EKG: Independently reviewed.  Sinus rhythm at 80.  Normal intervals.  Normal axis.  Very poor R wave progression.  Diffuse T wave flattening throughout.  No acute ST-T wave changes.   Assessment/Plan:   Active Problems:   Chronic systolic heart failure (HCC)   HTN (hypertension)   DM (diabetes mellitus) ([place])   ARF (acute renal failure) ([place])  79 year old female presents with known combined systolic and diastolic heart failure presents with orthopnea progressive over the past 8 days and new onset pedal edema.  BNP is 700.  No chest pain.  EKG and troponin are not suggestive of acute ischemia.  Patient is in acute renal failure with creatinine of 4.  Acute on chronic combined systolic and diastolic heart failure Because of acute decompensation is not entirely clear.  No evidence for ischemia.  No new medications.  Unsure whether his patient had had an oral sodium load. Echocardiogram in 2019 shows ejection fraction 45% with stage II diastolic dysfunction. We will start Lasix 60 mg IV daily however given creatinine of 4 she may need higher doses.  Follow ins and outs carefully, daily weights ordered. Continue metoprolol ACE inhibitor not started due to kidney insufficiency. I am holding her amlodipine but this may need to be restarted depending on how her blood pressure does with  diuresis. Cardiology consultation requested. Echocardiogram ordered.  Acute kidney failure Baseline creatinine is 2.4 from our notes however patient may have progressed since 2019 which is when her last creatinine  is from.  She is followed at [name]. Possibly patient has worsened kidney failure due to decreased forward flow and heart failure. BUN is 59, I do not think patient is uremic although she is having nausea of unclear etiology for the past 5 days. Can consider renal consultation if creatinine does not improve with diuresis and improved [place] forces.  DM2 Patient does not appear to be on any diabetes medications as an outpatient. We will place patient on CBGs 3 times daily AC x3 days  We will not order any SSI for now.    HTN I am holding patient's amlodipine as I do not know what her pressure will do with diuresis. Continue Toprol 25 twice daily  Anemia Unclear what patient's baseline H&H are. Certainly she can have anemia secondary to her kidney failure. No evidence for acute blood loss.  Blindness Stable.     Other information:   DVT prophylaxis: Subcu heparin ordered. Code Status: Full Family Communication: Patient's granddaughter was at bedside throughout Disposition Plan: Home Consults called: Cardiology for the morning Admission status: Inpatient  [facility]  If 7PM-7AM, please contact night-coverage [email or web address] Password [facility] 05/10/2019, 5:56 PM

## 2019-05-10 NOTE — ED Notes (Signed)
Dr. Monks at bedside 

## 2019-05-10 NOTE — ED Notes (Signed)
Pt given warm blanket as requested.  

## 2019-05-10 NOTE — ED Provider Notes (Signed)
[facility] Emergency Department Provider Note  ____________________________________________   First MD Initiated Contact with Patient 05/10/19 1536     (approximate)  I have reviewed the triage vital signs and the nursing notes.  History  Chief Complaint Shortness of Breath, Chest Pain, and Abdominal Pain    HPI Robin Rogers is a 79 y.o. female with hx of DM, HFrEF (45-50%), HTN, CKD, blindness who presents to the emergency department for shortness of breath.  Patient states symptoms started 7 to 8 days ago and have progressively worsened over this time.  In particular, she noticed orthopnea and shortness of breath while laying flat when going to bed at night.  Shortness of breath improved when sitting back up.  Some associated nausea over this time as well.  She also reports some shortness of breath with exertion as well as intermittent episodes of vague chest pain that seem to come on with the shortness of breath as well.  Denies any pain currently.  Also reports some bilateral lower extremity edema over the past few days, worse than her chronic swelling from an old injury.  Patient brought her medications at bedside, does not appear to be on any diuretics.  No fevers, chills, body aches, sick contacts.  Has received her first COVID vaccine. 2nd vaccine upcoming.   Note: history obtained with the assistance of patient's granddaughter at bedside, as patient speaks a very specific dialect of [place].  Granddaughter and patient are agreeable with this form of interpretation and decline traditional Spanish interpreter.   Past Medical Hx Past Medical History:  Diagnosis Date  . Blindness   . CHF (congestive heart failure) ([place])   . Diabetes mellitus without complication ([place])   . Hypertension   . Renal disorder     Problem List Patient Active Problem List   Diagnosis Date Noted  . Chronic systolic heart failure ([place]) 10/09/2017  . HTN (hypertension)  10/09/2017  . DM (diabetes mellitus) ([place]) 10/09/2017  . Intractable nausea and vomiting 09/22/2017    Past Surgical Hx Past Surgical History:  Procedure Laterality Date  . CHOLECYSTECTOMY    . ESOPHAGOGASTRODUODENOSCOPY N/A 09/24/2017   Procedure: ESOPHAGOGASTRODUODENOSCOPY (EGD);  Surgeon: Toledo, Benay Pike, MD;  Location: [facility] ENDOSCOPY;  Service: Gastroenterology;  Laterality: N/A;    Medications Prior to Admission medications   Medication Sig Start Date End Date Taking? Authorizing Provider  amLODipine (NORVASC) 10 MG tablet Take 1 tablet by mouth daily. 02/27/17   [provider]  aspirin EC 81 MG tablet Take 81 mg by mouth daily.    [provider]  carboxymethylcellulose (REFRESH PLUS) 0.5 % SOLN Place 1 drop into the right eye 4 (four) times daily.    [provider]  ferrous sulfate 325 (65 FE) MG tablet Take 325 mg by mouth 2 (two) times daily.    [provider]  furosemide (LASIX) 40 MG tablet Take 40 mg by mouth as needed.    [provider]  lovastatin (MEVACOR) 40 MG tablet Take 1 tablet by mouth at bedtime.  01/21/17   [provider]  metoprolol tartrate (LOPRESSOR) 25 MG tablet Take 1 tablet by mouth 2 (two) times daily. 12/02/16   [provider]  pantoprazole (PROTONIX) 40 MG tablet Take 1 tablet (40 mg total) by mouth daily. Patient taking differently: Take 40 mg by mouth as needed.  09/26/17 10/25/17  Bettey Costa, MD  polyethylene glycol (MIRALAX / GLYCOLAX) packet Take 17 g by mouth daily. 09/25/17  Bettey Costa, MD  prednisoLONE acetate (PRED FORTE) 1 % ophthalmic suspension Place 1 drop into the left eye 2 (two) times daily as needed.    [provider]  Vitamin D, Ergocalciferol, (DRISDOL) 50000 units CAPS capsule Take 50,000 Units by mouth every 7 (seven) days.    [provider]    Allergies Patient has no known allergies.  Family Hx History reviewed. No pertinent family  history.  Social Hx Social History   Tobacco Use  . Smoking status: Never Smoker  . Smokeless tobacco: Never Used  Substance Use Topics  . Alcohol use: No  . Drug use: Never     Review of Systems  Constitutional: Negative for fever. Negative for chills. Eyes: Negative for visual changes. ENT: Negative for sore throat. Cardiovascular: + for chest pain. Respiratory: + for shortness of breath. Gastrointestinal: + for nausea. Negative for vomiting.  Genitourinary: Negative for dysuria. Musculoskeletal: + for leg swelling. Skin: Negative for rash. Neurological: Negative for headaches.   Physical Exam  Vital Signs: ED Triage Vitals  Enc Vitals Group     BP 05/10/19 1451 (!) 149/50     Pulse Rate 05/10/19 1451 86     Resp 05/10/19 1451 18     Temp 05/10/19 1451 99.1 F (37.3 C)     Temp Source 05/10/19 1451 Oral     SpO2 05/10/19 1451 98 %     Weight 05/10/19 1452 175 lb (79.4 kg)     Height 05/10/19 1452 5\' 2"  (1.575 m)     Head Circumference --      Peak Flow --      Pain Score 05/10/19 1452 10     Pain Loc --      Pain Edu? --      Excl. in [place]? --     Constitutional: Alert and oriented. Well appearing. NAD.  Head: Normocephalic. Atraumatic. Eyes: Conjunctivae clear. Sclera anicteric. Pupils equal and symmetric. Nose: No masses or lesions. No congestion or rhinorrhea. Mouth/Throat: Wearing mask.  Neck: No stridor. Trachea midline.  Cardiovascular: Normal rate, regular rhythm. Extremities well perfused. Respiratory: Normal respiratory effort.  Lungs CTAB. Gastrointestinal: Soft. Non-distended. Non-tender.  Genitourinary: Deferred. Musculoskeletal: No lower extremity edema. No deformities. Neurologic:  Normal speech and language. No gross focal or lateralizing neurologic deficits are appreciated.  Skin: Skin is warm, dry and intact. No rash noted. Psychiatric: Mood and affect are appropriate for situation.  EKG  Personally reviewed and interpreted by  myself.   Date: 05/10/19 Time: 1131 Rate: 97 Rhythm: sinus Axis: normal Intervals: WNL No acute ischemic changes No STEMI    Radiology  Personally reviewed available imaging myself.   CXR - IMPRESSION:  1. Cardiomegaly. Mild pulmonary venous congestion and mild  pulmonary interstitial prominence. Small bilateral pleural  effusions. Mild CHF cannot be excluded.   2. Bibasilar subsegmental atelectasis.    Procedures  Procedure(s) performed (including critical care):  Procedures   Initial Impression / Assessment and Plan / MDM / ED Course  79 y.o. female who presents to the ED for 1 week of progressive shortness of breath, particularly orthopnea and dyspnea on exertion.  Also noticed some increased lower extremity swelling.  Ddx: HF exacerbation, other pulmonary infection, ACS, COVID, symptomatic anemia  Will plan for labs, imaging, COVID testing  Labs reveal anemia with hemoglobin 7.6 from 8 previously.  Suspect anemia of chronic disease, especially in the setting of her CKD.  Initial and delta troponin negative.  COVID negative.  CXR with  cardiomegaly and pulmonary venous congestion.  BNP elevated.  Creatinine 4.31 from ~2.4 previously.  Presentation consistent with HF, likely worsened by her acute on chronic kidney disease.  Will require careful diuresis in the setting of her acute on chronic kidney injury.  Will give dose of Lasix here and plan to admit.  Patient and family member at bedside in agreement with the plan.   _______________________________   As part of my medical decision making I have reviewed available labs, radiology tests, reviewed old records/performed chart review, obtained additional history from family.    Final Clinical Impression(s) / ED Diagnosis  Final diagnoses:  Shortness of breath  AKI (acute kidney injury) ([place])       Note:  This document was prepared using Dragon voice recognition software and may include unintentional  dictation errors.   Lilia Pro., MD 05/10/19 [phone]

## 2019-05-10 NOTE — ED Notes (Addendum)
See triage note, pt to ED for [facility] and CP x 1 week. Reports getting worse. Reports nausea but not vomitting. Granddaughter at bedside used for interpreter d/t pt using different dialogue of spanish and has difficulty understanding interpreter. Tachypnea noted.  Pt speaking in complete sentences.  100% on RA. 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

## 2019-05-10 NOTE — ED Triage Notes (Signed)
Spanish interpreter used for triage. SOB, central chest pressure, and abd pain x 1 week. Nausea but no vomiting or diarrhea.  A&O, speaking in complete sentences.

## 2019-05-10 NOTE — ED Notes (Signed)
Pharmacy called to verify meds and send missing medications

## 2019-05-11 ENCOUNTER — Inpatient Hospital Stay (HOSPITAL_COMMUNITY)
Admit: 2019-05-11 | Discharge: 2019-05-11 | Disposition: A | Discharge status: Home / Self Care | Payer: Medicare Other | Attending: Internal Medicine | Admitting: Internal Medicine

## 2019-05-11 DIAGNOSIS — N184 Chronic kidney disease, stage 4 (severe): Secondary | ICD-10-CM

## 2019-05-11 DIAGNOSIS — I5022 Chronic systolic (congestive) heart failure: Secondary | ICD-10-CM

## 2019-05-11 DIAGNOSIS — E1122 Type 2 diabetes mellitus with diabetic chronic kidney disease: Secondary | ICD-10-CM

## 2019-05-11 DIAGNOSIS — I5021 Acute systolic (congestive) heart failure: Secondary | ICD-10-CM

## 2019-05-11 DIAGNOSIS — I10 Essential (primary) hypertension: Secondary | ICD-10-CM

## 2019-05-11 DIAGNOSIS — N179 Acute kidney failure, unspecified: Secondary | ICD-10-CM

## 2019-05-11 DIAGNOSIS — D649 Anemia, unspecified: Secondary | ICD-10-CM

## 2019-05-11 LAB — GLUCOSE, CAPILLARY
Glucose-Capillary: 81 mg/dL (ref 70–99)
Glucose-Capillary: 79 mg/dL (ref 70–99)
Glucose-Capillary: 86 mg/dL (ref 70–99)
Glucose-Capillary: 108 mg/dL — ABNORMAL HIGH (ref 70–99)

## 2019-05-11 LAB — BASIC METABOLIC PANEL
Anion gap: 9 (ref 5–15)
BUN: 62 mg/dL — ABNORMAL HIGH (ref 8–23)
CO2: 23 mmol/L (ref 22–32)
Calcium: 7.4 mg/dL — ABNORMAL LOW (ref 8.9–10.3)
Chloride: 106 mmol/L (ref 98–111)
Creatinine, Ser: 4.35 mg/dL — ABNORMAL HIGH (ref 0.44–1.00)
GFR calc Af Amer: 11 mL/min — ABNORMAL LOW (ref >60)
GFR calc non Af Amer: 9 mL/min — ABNORMAL LOW (ref >60)
Glucose, Bld: 96 mg/dL (ref 70–99)
Potassium: 5.4 mmol/L — ABNORMAL HIGH (ref 3.5–5.1)
Sodium: 138 mmol/L (ref 135–145)

## 2019-05-11 LAB — ECHOCARDIOGRAM COMPLETE
Height: 62 in
Weight: 2592 oz

## 2019-05-11 LAB — HEMOGLOBIN A1C
Hgb A1c MFr Bld: 5.3 % (ref 4.8–5.6)
Mean Plasma Glucose: 105.41 mg/dL

## 2019-05-11 MED ORDER — INSULIN ASPART 100 UNIT/ML ~~LOC~~ SOLN: 0.0000 [IU] | Freq: Every day | SUBCUTANEOUS | Status: DC

## 2019-05-11 MED ORDER — MORPHINE SULFATE (PF) 2 MG/ML IV SOLN
1.0000 mg | Freq: Once | INTRAVENOUS | Status: AC
  Administered 2019-05-11: 02:00:00 1 mg via INTRAVENOUS
  Filled 2019-05-11: qty 1

## 2019-05-11 MED ORDER — INSULIN ASPART 100 UNIT/ML ~~LOC~~ SOLN
0.0000 [IU] | Freq: Three times a day (TID) | SUBCUTANEOUS | Status: DC
  Administered 2019-05-13: 17:00:00 1 [IU] via SUBCUTANEOUS
  Administered 2019-05-14: 3 [IU] via SUBCUTANEOUS
  Administered 2019-05-15 – 2019-05-16 (×2): 2 [IU] via SUBCUTANEOUS
  Filled 2019-05-11 (×3): qty 1

## 2019-05-11 NOTE — Progress Notes (Signed)
*  PRELIMINARY RESULTS* Echocardiogram 2D Echocardiogram has been performed.  Robin Rogers 05/11/2019, 9:42 AM

## 2019-05-11 NOTE — Progress Notes (Signed)
PROGRESS NOTE  Robin Rogers VQQ:[id number] DOB: [birth year] DOA: 05/10/2019 PCP: [facility] [place]  Brief History   Robin Rogers is an 79 y.o. 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  Patient noted that she could not lay down flat because she was short of breath and felt improved when she sat back up.  Prior to this and during this time patient had no chest pain diaphoresis although she has been nauseated for the past 4 days.  Patient decided to come to the hospital today because she could not sleep at all last night because of inability to lay down due to shortness of breath.  Review of systems notable for nausea as noted above.  No vomiting.  She does admit to anorexia.  She is also noted new onset lower extremity edema for the past 3 days.  Patient denies any new medications other than what she is prescribed at the [facility] clinic.  Denies taking any NSAIDs.  Patient specifically denies any fevers or chills or cough or body aches or much malaise or myalgia.  ED Course:  The patient is noted to be a little bit short of breath but no hypoxia on room air.  Chest x-ray showed some mild interstitial vascular congestion.  BNP was noted to be 700.  She was noted to be in acute kidney failure with creatinine of 4.3 up from 2.4.  The patient has been admitted to a telemetry bed. Echocardiogram results are pending. She is being diuresed. She is currently negative 3.45L in terms of volume status.   Consultants  . None  Procedures  . None  Antibiotics   Anti-infectives (From admission, onward)   None    .  Subjective  The patient tells me that she is feeling better. Denies pain.  Objective   Vitals:  Vitals:     05/11/19 1627 05/11/19 1934  BP: (!) 157/68 (!) 155/63  Pulse: 70 73  Resp: 17 19  Temp: 98.3 F (36.8 C) 97.8 F (36.6 C)  SpO2: 96% 97%  Exam:  Constitutional:  . The patient is awake, alert, and oriented x 3. No acute distress. Respiratory:  . No increased work of breathing. . No wheezes or rhonchi . Positive for scattered rales bilaterally. . No tactile fremitus . Diminished breath sounds bilaterally. Cardiovascular:  . Regular rate and rhythm . No murmurs, ectopy, or gallups. . No lateral PMI. No thrills. Abdomen:  . Abdomen is soft, non-tender, non-distended . No hernias, masses, or organomegaly . Normoactive bowel sounds.  Musculoskeletal:  . No cyanosis or clubbing . Positive for 2+ edema Lower extremity.bilaterally Skin:  . No rashes, lesions, ulcers . palpation of skin: no induration or nodules Neurologic:  . CN 2-12 intact . Sensation all 4 extremities intact Psychiatric:  . Mental status o Mood, affect appropriate o Orientation to person, place, time  . judgment and insight appear intact  I have personally reviewed the following:   Today's Data  . Vitals, BMP, BNP  Imaging  . CXR: Bibasilar subsegmental atelectasis. Pulmonary congestion and mild pulmonary interstitial prominence.   Cardiology Data  . Echo is pending.  Scheduled Meds: . aspirin EC  81 mg Oral Daily  . furosemide  60 mg  Intravenous Daily  . heparin  5,000 Units Subcutaneous Q8H  . insulin aspart  0-15 Units Subcutaneous TID WC  . insulin aspart  0-5 Units Subcutaneous QHS  . metoprolol tartrate  25 mg Oral BID  . pantoprazole  40 mg Oral Daily  . pravastatin  40 mg Oral q1800  . sodium chloride flush  3 mL Intravenous Q12H   Continuous Infusions: . sodium chloride      Active Problems:   Chronic systolic heart failure (HCC)   HTN (hypertension)   DM (diabetes mellitus) ([place])   ARF (acute renal failure) (HCC)   Acute on chronic heart failure with reduced ejection  fraction and diastolic dysfunction (HCC)   LOS: 1 day   A & P  Acute on chronic combined systolic and diastolic heart failure: Because of acute decompensation is not entirely clear.  No evidence for ischemia.  No new medications.  Unsure whether this patient had had an oral sodium load. Echocardiogram in 2019 shows ejection fraction 45% with stage II diastolic dysfunction. Echocardiogram repeated. Report is pending. The patient's fluid balance is negative 3.45 L since admission. Continue diuresis, metoprolol. Consult cardiology in the am.  Acute kidney failure: Baseline creatinine is 2.4 from our notes however patient may have progressed since 2019 which is when her last creatinine is from.  She is followed at [name]. Possibly patient has worsened kidney failure due to decreased forward flow and heart failure. BUN is 59, I do not think patient is uremic although she is having nausea of unclear etiology for the past 5 days. Can consider renal consultation if creatinine does not improve with diuresis and improved [place] forces. Monitor creatinine, electrolytes, and volume status. Avoid nephrotoxins and hypotension.   DM2: Patient does not appear to be on any diabetes medications as an outpatient. We will place patient on CBGs 3 times daily AC x3 days. We will not order any SSI for now. Glucoses for the past 24 hours have run from 79 to 90.  HTN: Blood pressures are a little high on metoprolol 25 mg bid alone. Will add back norvasc. Monitor.  Anemia: Check iron studies and FOBT. Monitor hemoglobin. Transfuse for hemoglobin less than 7.0. DDX includes iron deficiency anemia, blood loss anemia, anemia due to chronic disease including renal insufficiency.   Blindness: Noted, chronic and stable. Stable.  I have seen and examined this patient myself. I have spent 35 minutes in her evaluation and care.  DVT prophylaxis: Subcu heparin ordered. Code Status: Full Family  Communication: None available Disposition Plan: Home  Robin Ozga, DO [facility] Direct contact: see [email or web address]  7PM-7AM contact night coverage as above 05/11/2019, 7:37 PM  LOS: 1 day

## 2019-05-11 NOTE — Progress Notes (Signed)
New order morphine 1mg  administered per NP [place]. NP bedside to assess patient. Patient currently resting peacefully.

## 2019-05-11 NOTE — Progress Notes (Signed)
   05/11/19 0058  Vitals  Temp 98.8 F (37.1 C)  BP (!) 145/71  MAP (mmHg) 94  BP Location Right Arm  BP Method Automatic  Patient Position (if appropriate) Lying  Pulse Rate 79  Pulse Rate Source Monitor  Resp (!) 25  Oxygen Therapy  SpO2 100 %  O2 Device Nasal Cannula  O2 Flow Rate (L/min) 2 L/min  MEWS Score  MEWS Temp 0  MEWS Systolic 0  MEWS Pulse 0  MEWS RR 1  MEWS LOC 0  MEWS Score 1  MEWS Score Color Green  Patient displaying increased SOB resulting in increased anxiety. NP notified. Will continue to monitor to ensure comfort and safety.

## 2019-05-11 NOTE — Progress Notes (Signed)
Contacted interpreter services to receive document for patient and family to sign about granting permission to translate. Interpreter stated they would have to print the document tomorrow for them to sign. Designer, multimedia, Youth worker are aware.

## 2019-05-11 NOTE — Progress Notes (Signed)
Patient speaks "tarascan", which we do not have an interpreter here to provide translation. Patients daughters have been granted permission to stay overnight to translate for their mother.

## 2019-05-12 LAB — BASIC METABOLIC PANEL
Anion gap: 8 (ref 5–15)
BUN: 64 mg/dL — ABNORMAL HIGH (ref 8–23)
CO2: 22 mmol/L (ref 22–32)
Calcium: 7.1 mg/dL — ABNORMAL LOW (ref 8.9–10.3)
Chloride: 103 mmol/L (ref 98–111)
Creatinine, Ser: 4.22 mg/dL — ABNORMAL HIGH (ref 0.44–1.00)
GFR calc Af Amer: 11 mL/min — ABNORMAL LOW (ref >60)
GFR calc non Af Amer: 10 mL/min — ABNORMAL LOW (ref >60)
Glucose, Bld: 96 mg/dL (ref 70–99)
Potassium: 4.8 mmol/L (ref 3.5–5.1)
Sodium: 133 mmol/L — ABNORMAL LOW (ref 135–145)

## 2019-05-12 LAB — GLUCOSE, CAPILLARY
Glucose-Capillary: 82 mg/dL (ref 70–99)
Glucose-Capillary: 85 mg/dL (ref 70–99)
Glucose-Capillary: 87 mg/dL (ref 70–99)
Glucose-Capillary: 156 mg/dL — ABNORMAL HIGH (ref 70–99)

## 2019-05-12 MED ORDER — LACTULOSE 10 GM/15ML PO SOLN
10.0000 g | Freq: Three times a day (TID) | ORAL | Status: DC
  Administered 2019-05-12 – 2019-05-16 (×3): 10 g via ORAL
  Filled 2019-05-12 (×5): qty 30

## 2019-05-12 MED ORDER — GUAIFENESIN-DM 100-10 MG/5ML PO SYRP
5.0000 mL | ORAL_SOLUTION | ORAL | Status: DC | PRN
  Administered 2019-05-12 (×2): 5 mL via ORAL
  Filled 2019-05-12 (×2): qty 5

## 2019-05-12 MED ORDER — MAGNESIUM HYDROXIDE 400 MG/5ML PO SUSP: 5.0000 mL | Freq: Every day | ORAL | Status: DC

## 2019-05-12 NOTE — Plan of Care (Signed)
  Problem: Education: Goal: Knowledge of General Education information will improve Description: Including pain rating scale, medication(s)/side effects and non-pharmacologic comfort measures Outcome: Progressing   Problem: Health Behavior/Discharge Planning: Goal: Ability to manage health-related needs will improve Outcome: Progressing   Problem: Elimination: Goal: Will not experience complications related to bowel motility Outcome: Progressing   Problem: Pain Managment: Goal: General experience of comfort will improve Outcome: Progressing   Problem: Safety: Goal: Ability to remain free from injury will improve Outcome: Progressing

## 2019-05-12 NOTE — Care Management Important Message (Signed)
Important Message  Patient Details  Name: Robin Rogers MRN: [id number] Date of Birth: [birth year]   Medicare Important Message Given:  Yes  Initial Medicare IM given by Patient Access Associate on 05/12/2019 at 12:48pm.     Dannette Barbara 05/12/2019, 3:06 PM

## 2019-05-12 NOTE — TOC Initial Note (Signed)
Transition of Care [facility]) - Initial/Assessment Note    Patient Details  Name: Robin Rogers MRN: [id number] Date of Birth: 05-25-1940  [facility] CM/SW Contact:    Shelbie Ammons, RN Phone Number: 05/12/2019, 10:05 AM  Clinical Narrative:   RNCM assessed patient at bedside. Patient is sitting to side of bed with family at bedside. Family reports patient is normally independent at home and usually doesn't have any needs. She reports that she does not have any equipment at home that she uses. She does weigh herself and understands that she is supposed to report any weight gain of 3 lbs in a day or 5 lbs in a week.       Expected Discharge Plan: Home/Self Care Barriers to Discharge: Continued Medical Work up   Patient Goals and CMS Choice        Expected Discharge Plan and Services Expected Discharge Plan: Home/Self Care       Living arrangements for the past 2 months: Single Family Home                                      Prior Living Arrangements/Services Living arrangements for the past 2 months: Single Family Home Lives with:: Relatives Patient language and need for interpreter reviewed:: Yes(family at bedside) Do you feel safe going back to the place where you live?: Yes      Need for Family Participation in Patient Care: Yes (Comment) Care giver support system in place?: Yes (comment)   Criminal Activity/Legal Involvement Pertinent to Current Situation/Hospitalization: No - Comment as needed  Activities of Daily Living Home Assistive Devices/Equipment: Eyeglasses ADL Screening (condition at time of admission) Patient's cognitive ability adequate to safely complete daily activities?: Yes Is the patient deaf or have difficulty hearing?: No Does the patient have difficulty seeing, even when wearing glasses/contacts?: No Does the patient have difficulty concentrating, remembering, or making decisions?: Yes Patient able to express need for  assistance with ADLs?: Yes Does the patient have difficulty dressing or bathing?: Yes Independently performs ADLs?: No Does the patient have difficulty walking or climbing stairs?: Yes Weakness of Legs: None Weakness of Arms/Hands: None  Permission Sought/Granted                  Emotional Assessment Appearance:: Appears stated age     Orientation: : Oriented to Self, Oriented to Place, Oriented to  Time, Oriented to Situation Alcohol / Substance Use: Not Applicable Psych Involvement: No (comment)  Admission diagnosis:  Shortness of breath [R06.02] Acute on chronic heart failure with reduced ejection fraction and diastolic dysfunction ([place]) [I50.43] Patient Active Problem List   Diagnosis Date Noted  . ARF (acute renal failure) ([place]) 05/10/2019  . Acute on chronic heart failure with reduced ejection fraction and diastolic dysfunction ([place]) 05/10/2019  . Chronic systolic heart failure ([place]) 10/09/2017  . HTN (hypertension) 10/09/2017  . DM (diabetes mellitus) ([place]) 10/09/2017  . Intractable nausea and vomiting 09/22/2017   PCP:  [facility] [place]:   [facility] [address] [place] [address] Phone: [phone] Fax: [place], [place] [address] Phone: [phone] Fax: [phone]     Social Determinants of Health (SDOH) Interventions    Readmission Risk Interventions No flowsheet data  found.  

## 2019-05-12 NOTE — Progress Notes (Signed)
PROGRESS NOTE  Robin Rogers DOB: [birth year] DOA: 05/10/2019 PCP: [facility] [place]  Brief History   Robin Rogers is an 79 y.o. 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  Patient noted that she could not lay down flat because she was short of breath and felt improved when she sat back up.  Prior to this and during this time patient had no chest pain diaphoresis although she has been nauseated for the past 4 days.  Patient decided to come to the hospital today because she could not sleep at all last night because of inability to lay down due to shortness of breath.  Review of systems notable for nausea as noted above.  No vomiting.  She does admit to anorexia.  She is also noted new onset lower extremity edema for the past 3 days.  Patient denies any new medications other than what she is prescribed at the [facility] clinic.  Denies taking any NSAIDs.  Patient specifically denies any fevers or chills or cough or body aches or much malaise or myalgia.  ED Course:  The patient is noted to be a little bit short of breath but no hypoxia on room air.  Chest x-ray showed some mild interstitial vascular congestion.  BNP was noted to be 700.  She was noted to be in acute kidney failure with creatinine of 4.3 up from 2.4.  . The patient has been admitted to a telemetry bed. She is being diuresed. She is currently negative 3.45L in terms of volume status. Echocardiogram has been completed. It  demonstrates EF of 40-45 % with global hypokinesis of left ventricle and grade III diastolic dysfunction. Cardiology has been consulted.  Consultants  . Cardiology  Procedures  . None  Antibiotics   Anti-infectives (From  admission, onward)   None     Subjective  The patient tells me that she is feeling better. Denies pain. Daughter is translating for patient.  Objective   Vitals:  Vitals:   05/12/19 1112 05/12/19 1638  BP: (!) 145/53 (!) 154/61  Pulse: 64 70  Resp: 17 18  Temp: 97.9 F (36.6 C) 99 F (37.2 C)  SpO2: 96% 99%  Exam:  Constitutional:  . The patient is awake, alert, and oriented x 3. No acute distress. Respiratory:  . No increased work of breathing. . No wheezes or rhonchi . Positive for scattered rales bilaterally. . No tactile fremitus . Diminished breath sounds bilaterally. Cardiovascular:  . Regular rate and rhythm . No murmurs, ectopy, or gallups. . No lateral PMI. No thrills. Abdomen:  . Abdomen is soft, non-tender, non-distended . No hernias, masses, or organomegaly . Normoactive bowel sounds.  Musculoskeletal:  . No cyanosis or clubbing . Positive for 2+ edema Lower extremity.bilaterally Skin:  . No rashes, lesions, ulcers . palpation of skin: no induration or nodules Neurologic:  . CN 2-12 intact . Sensation all 4 extremities intact Psychiatric:  . Mental status o Mood, affect appropriate o Orientation to person, place, time  . judgment and insight appear intact  I have personally reviewed the following:   Today's Data  . Vitals, BMP, BNP  Imaging  . CXR: Bibasilar subsegmental atelectasis. Pulmonary congestion and mild pulmonary interstitial prominence.  Cardiology Data  . Echo demonstrates EF of 40-45 % with global hypokinesis of left ventricle and grade III diastolic dysfunction  Scheduled Meds: . aspirin EC  81 mg Oral Daily  . furosemide  60 mg Intravenous Daily  . heparin  5,000 Units Subcutaneous Q8H  . insulin aspart  0-15 Units Subcutaneous TID WC  . insulin aspart  0-5 Units Subcutaneous QHS  . lactulose  10 g Oral TID  . metoprolol tartrate  25 mg Oral BID  . pantoprazole  40 mg Oral Daily  . pravastatin  40 mg Oral q1800  .  sodium chloride flush  3 mL Intravenous Q12H   Continuous Infusions: . sodium chloride      Active Problems:   Chronic systolic heart failure (HCC)   HTN (hypertension)   DM (diabetes mellitus) ([place])   ARF (acute renal failure) (HCC)   Acute on chronic heart failure with reduced ejection fraction and diastolic dysfunction (HCC)   LOS: 2 days   A & P  Acute on chronic combined systolic and diastolic heart failure: Because of acute decompensation is not entirely clear.  No evidence for ischemia.  No new medications.  Unsure whether this patient had had an oral sodium load.  Echocardiogram in 2019 shows ejection fraction 45% with stage II diastolic  dysfunction. Repeated echocardiogram demonstrates  demonstrates EF of 40-45  % with global hypokinesis of left ventricle and grade III diastolic dysfunction  The patient's fluid balance is negative 3.45 L since admission. Continue diuresis,  metoprolol. Cardiology has been consulted.   Acute kidney failure: Baseline creatinine is 2.4 from our notes however patient may have progressed since 2019 which is when her last creatinine is from.  She is followed at [name]. Possibly patient has worsened kidney failure due to decreased forward flow and heart failure. BUN is 59, I do not think patient is uremic although she is having nausea of unclear etiology for the past 5 days. Monitor creatinine, electrolytes, and volume status. Avoid nephrotoxins and hypotension. Due to only minimal improvement in creatinine, nephrology will be consulted in the am.  DM2: Patient does not appear to be on any diabetes medications as an outpatient. We will place patient on CBGs 3 times daily AC x3 days. We will not order any SSI for now. Glucoses for the past 24 hours have run from 79 to 90.  HTN: Blood pressures are a little high on metoprolol 25 mg bid alone. Will add back norvasc. Monitor.  Anemia: Check iron studies and FOBT. Monitor hemoglobin.  Transfuse for hemoglobin less than 7.0. DDX includes iron deficiency anemia, blood loss anemia, anemia due to chronic disease including renal insufficiency.   Blindness: Noted, chronic and stable. Stable.  I have seen and examined this patient myself. I have spent 32 minutes in her evaluation and care.  DVT prophylaxis: Subcu heparin ordered. Code Status: Full Family Communication: None available Disposition Plan: Home  Robin Onstott, DO [facility] Direct contact: see [email or web address]  7PM-7AM contact night coverage as above 05/12/2019, 5:18 PM  LOS: 1 day

## 2019-05-13 ENCOUNTER — Encounter: Payer: Self-pay | Admitting: Internal Medicine

## 2019-05-13 DIAGNOSIS — I420 Dilated cardiomyopathy: Secondary | ICD-10-CM

## 2019-05-13 DIAGNOSIS — I110 Hypertensive heart disease with heart failure: Secondary | ICD-10-CM

## 2019-05-13 DIAGNOSIS — N172 Acute kidney failure with medullary necrosis: Secondary | ICD-10-CM

## 2019-05-13 LAB — BASIC METABOLIC PANEL
Anion gap: 6 (ref 5–15)
BUN: 70 mg/dL — ABNORMAL HIGH (ref 8–23)
CO2: 26 mmol/L (ref 22–32)
Calcium: 7.3 mg/dL — ABNORMAL LOW (ref 8.9–10.3)
Chloride: 106 mmol/L (ref 98–111)
Creatinine, Ser: 4.43 mg/dL — ABNORMAL HIGH (ref 0.44–1.00)
GFR calc Af Amer: 10 mL/min — ABNORMAL LOW (ref >60)
GFR calc non Af Amer: 9 mL/min — ABNORMAL LOW (ref >60)
Glucose, Bld: 84 mg/dL (ref 70–99)
Potassium: 4.8 mmol/L (ref 3.5–5.1)
Sodium: 138 mmol/L (ref 135–145)

## 2019-05-13 LAB — CBC
HCT: 24.1 % — ABNORMAL LOW (ref 36.0–46.0)
Hemoglobin: 7.8 g/dL — ABNORMAL LOW (ref 12.0–15.0)
MCH: 32 pg (ref 26.0–34.0)
MCHC: 32.4 g/dL (ref 30.0–36.0)
MCV: 98.8 fL (ref 80.0–100.0)
Platelets: 230 10*3/uL (ref 150–400)
RBC: 2.44 MIL/uL — ABNORMAL LOW (ref 3.87–5.11)
RDW: 12.4 % (ref 11.5–15.5)
WBC: 5.2 10*3/uL (ref 4.0–10.5)
nRBC: 0 % (ref 0.0–0.2)

## 2019-05-13 LAB — GLUCOSE, CAPILLARY
Glucose-Capillary: 75 mg/dL (ref 70–99)
Glucose-Capillary: 85 mg/dL (ref 70–99)
Glucose-Capillary: 124 mg/dL — ABNORMAL HIGH (ref 70–99)
Glucose-Capillary: 95 mg/dL (ref 70–99)

## 2019-05-13 LAB — MAGNESIUM: Magnesium: 1.4 mg/dL — ABNORMAL LOW (ref 1.7–2.4)

## 2019-05-13 MED ORDER — LORAZEPAM 2 MG/ML IJ SOLN: 2.0000 mg | INTRAMUSCULAR | Status: DC

## 2019-05-13 MED ORDER — HYDRALAZINE HCL 10 MG PO TABS
10.0000 mg | ORAL_TABLET | Freq: Three times a day (TID) | ORAL | Status: DC
  Administered 2019-05-13 – 2019-05-16 (×8): 10 mg via ORAL
  Filled 2019-05-13 (×10): qty 1

## 2019-05-13 MED ORDER — EPOETIN ALFA 4000 UNIT/ML IJ SOLN
4000.0000 [IU] | Freq: Once | INTRAMUSCULAR | Status: AC
  Administered 2019-05-13: 20:00:00 4000 [IU] via SUBCUTANEOUS
  Filled 2019-05-13 (×2): qty 1

## 2019-05-13 MED ORDER — FUROSEMIDE 40 MG PO TABS
40.0000 mg | ORAL_TABLET | Freq: Two times a day (BID) | ORAL | Status: DC
  Administered 2019-05-13 – 2019-05-16 (×7): 40 mg via ORAL
  Filled 2019-05-13 (×7): qty 1

## 2019-05-13 MED ORDER — CARVEDILOL 6.25 MG PO TABS
6.2500 mg | ORAL_TABLET | Freq: Two times a day (BID) | ORAL | Status: DC
  Administered 2019-05-13 – 2019-05-16 (×6): 6.25 mg via ORAL
  Filled 2019-05-13 (×6): qty 1

## 2019-05-13 MED ORDER — MAGNESIUM SULFATE 4 GM/100ML IV SOLN
4.0000 g | Freq: Once | INTRAVENOUS | Status: AC
  Administered 2019-05-13: 09:00:00 4 g via INTRAVENOUS
  Filled 2019-05-13: qty 100

## 2019-05-13 NOTE — Consult Note (Addendum)
Cardiology Consult    Patient ID: Robin Rogers MRN: [id number], DOB/AGE: Jul 23, 1940   Admit date: 05/10/2019 Date of Consult: 05/13/2019  Primary Physician: [facility] [place] Primary Cardiologist: Ida Rogue, MD - new Requesting Provider: Domenica Reamer, MD  Patient Profile    Robin Rogers is a 79 y.o. female with a history of chronic combined syst/diast CHF, HTN, diet-controlled DMII, CKD IV-V, anemia, and blindness, who is being seen today for the evaluation of CHF at the request of Dr. Benny Lennert.  Past Medical History   Past Medical History:  Diagnosis Date  . Anemia of chronic disease   . Blindness   . Chronic combined systolic (congestive) and diastolic (congestive) heart failure ([place])    a. 09/2017 Echo: EF 45-50%, Gr2 DD; b. 05/2019 Echo: EF 40-45%, Gr III DD (restrictive), nl RV fxn, mod elev PASP. Mod dil LA. Mild MR.  . CKD (chronic kidney disease), stage IV ([place])   . Diet-controlled diabetes mellitus ([place])   . Hypertension     Past Surgical History:  Procedure Laterality Date  . CHOLECYSTECTOMY    . ESOPHAGOGASTRODUODENOSCOPY N/A 09/24/2017   Procedure: ESOPHAGOGASTRODUODENOSCOPY (EGD);  Surgeon: Toledo, Benay Pike, MD;  Location: [facility] ENDOSCOPY;  Service: Gastroenterology;  Laterality: N/A;     Allergies  No Known Allergies  History of Present Illness    History obtained with assistance of interpreter.  79 y.o. female with a history of chronic combined syst/diast CHF, HTN, DMII, CKD IV-V, and blindness.  She was previously admitted in 09/2017 in the setting of abd pain, n, v, and respiratory failure w/ volume excess.  Echo showed an EF of 45-50% w/ grade II diastolic dysfxn.  She was placed on oral lasix and followed up x 1 in CHF clinic, at which time she was euvolemic and advised to use lasix PRN.  She has since been followed by her PCP and take amlodipine, lovastatin, metoprolol, and protonix at home.  She has not required medication for  diabetes (A1c 5.3 this admission).  She lives locally with her husband who has ESRD and is on HD.  Their children live nearby and do the grocery shopping for them.  Pts activity is limited by blindness but she is usually able to walk independently in and around her home without symptoms or limitations.  She was in her USOH until ~ 1 wk prior to admission when she began to experience orthopnea and DOE.  She denies any h/o chest pain.  She did begin to experience nausea and abdominal discomfort with constipation.  In the setting of ongoing Ss, she presented to the ED on 5/4.  There, Creat was elevated above prior known baseline of 2.4 (4.3 on admission).  She was moderately hypertensive and noted to have lower ext edema and bibasilar crackles.  BNP elevated @ 702.  HsTrop nl x 2. CXR w/ pulm vascular congestion and sm bilat pleural effusions.  Hgb 7.6.  She was admitted and placed on IV lasix w/ good response (minus 5.4L for admission and 4.9 kg reduction in weight).  With this, Ss have improved/dyspnea/orthopnea resolved, however creat has risen slightly to 4.43 (BUN 59  70).  Echo performed 5/4 showed slight worsening of LV dysfxn w/ grade III (restrictive) diastolic dysfxn.  We have been asked to eval.  Pt currently asymptomatic.  Inpatient Medications    . aspirin EC  81 mg Oral Daily  . furosemide  40 mg Oral BID  . heparin  5,000 Units  Subcutaneous Q8H  . insulin aspart  0-15 Units Subcutaneous TID WC  . insulin aspart  0-5 Units Subcutaneous QHS  . lactulose  10 g Oral TID  . metoprolol tartrate  25 mg Oral BID  . pantoprazole  40 mg Oral Daily  . pravastatin  40 mg Oral q1800  . sodium chloride flush  3 mL Intravenous Q12H    Family History    History reviewed. No pertinent family history. She indicated that her mother is deceased. She indicated that her father is deceased.   Social History    Social History   Socioeconomic History  . Marital status: Married    Spouse name: Not on  file  . Number of children: Not on file  . Years of education: Not on file  . Highest education level: Not on file  Occupational History  . Not on file  Tobacco Use  . Smoking status: Never Smoker  . Smokeless tobacco: Never Used  Substance and Sexual Activity  . Alcohol use: No  . Drug use: Never  . Sexual activity: Not Currently  Other Topics Concern  . Not on file  Social History Narrative  . Not on file   Social Determinants of Health   Financial Resource Strain:   . Difficulty of Paying Living Expenses:   Food Insecurity:   . Worried About Charity fundraiser in the Last Year:   . Arboriculturist in the Last Year:   Transportation Needs:   . Film/video editor (Medical):   Marland Kitchen Lack of Transportation (Non-Medical):   Physical Activity:   . Days of Exercise per Week:   . Minutes of Exercise per Session:   Stress:   . Feeling of Stress :   Social Connections:   . Frequency of Communication with Friends and Family:   . Frequency of Social Gatherings with Friends and Family:   . Attends Religious Services:   . Active Member of Clubs or Organizations:   . Attends Archivist Meetings:   Marland Kitchen Marital Status:   Intimate Partner Violence:   . Fear of Current or Ex-Partner:   . Emotionally Abused:   Marland Kitchen Physically Abused:   . Sexually Abused:      Review of Systems    General:  No chills, fever, night sweats or weight changes. HEENT: blind  Cardiovascular:  No chest pain, +++ dyspnea on exertion, +++ LE edema, +++ orthopnea, no palpitations, +++ paroxysmal nocturnal dyspnea. Dermatological: No rash, lesions/masses Respiratory: No cough, +++ dyspnea Urologic: No hematuria, dysuria Abdominal:   +++ nausea w/ abd bloating and constipation, no vomiting, diarrhea, bright red blood per rectum, melena, or hematemesis Neurologic:  No visual changes, wkns, changes in mental status. All other systems reviewed and are otherwise negative except as noted  above.  Physical Exam    Blood pressure (!) 150/51, pulse 68, temperature 98.4 F (36.9 C), temperature source Oral, resp. rate 16, height 5\' 2"  (1.575 m), weight 71.1 kg, SpO2 96 %.  General: Pleasant, NAD Psych: Normal affect. Neuro: Alert and oriented X 3. Moves all extremities spontaneously. HEENT: Normal  Neck: Supple without bruits or JVD. Lungs:  Resp regular and unlabored, CTA. Heart: RRR no s3, s4, or murmurs. Abdomen: Soft, non-tender, non-distended, BS + x 4.  Extremities: No clubbing, cyanosis or edema. DP/PT/Radials 2+ and equal bilaterally.  Labs    Cardiac Enzymes Recent Labs  Lab 05/10/19 1455 05/10/19 1651  TROPONINIHS 14 13  Lab Results  Component Value Date   WBC 5.2 05/13/2019   HGB 7.8 (L) 05/13/2019   HCT 24.1 (L) 05/13/2019   MCV 98.8 05/13/2019   PLT 230 05/13/2019    Recent Labs  Lab 05/13/19 0408  NA 138  K 4.8  CL 106  CO2 26  BUN 70*  CREATININE 4.43*  CALCIUM 7.3*  GLUCOSE 84    Radiology Studies    DG Chest Port 1 View  Result Date: 05/10/2019 CLINICAL DATA:  Shortness of breath. EXAM: PORTABLE CHEST 1 VIEW COMPARISON:  09/25/2017.  09/22/2017. FINDINGS: Severe cardiomegaly again noted. Mild pulmonary venous congestion. Mild interstitial prominence. Small bilateral pleural effusions. Mild CHF cannot be excluded. Bibasilar subsegmental atelectasis. Degenerative changes scoliosis thoracic spine. IMPRESSION: One cardiomegaly. Mild pulmonary venous congestion and mild pulmonary interstitial prominence. Small bilateral pleural effusions. Mild CHF cannot be excluded. 2.  Bibasilar subsegmental atelectasis. Electronically Signed   By: Marcello Moores  Register   On: 05/10/2019 16:06   ECG & Cardiac Imaging    RSR, 85, poor R progression, nonspecific T changes - personally reviewed.  Assessment & Plan    1.  Acute on chronic combined systolic and diastolic CHF:  Pt w/ prior admission in setting of dyspnea and abd discomfort in 2019 w/ echo on  that admission revealing mild LV dysfxn (EF 45-50%) w/ grade II diast dysfxn.  She followed up w/ CHF clinic x 1 but has since been followed by her PCP and appears to have done well until last week, when she began to experience orthopnea and DOE, prompting presentation on 5/4.  BNP was elevated in ED (702) while CXR showed pulm venous congestion and sm bilat pleural effusions.  Hgb 7.6, while creat elevated above prior known baseline @ 4.31.  Since admission, she has responded well to IV diuresis and is now minus 5.4L w/ 4.9 kg reduction in weight (71.1 kg - well below 10/2017 d/c weight).    Echo 5/4 showed sl worsening of LV dysfxn - EF now 40-45% - w/ grade III (restrictive) diastolic dysfxn and mod elev PASP.  She is euvolemic on exam this AM and has been transitioned to PO lasix as creat has risen slightly to 4.43 w/ BUN of 70.  May only require prn lasix @ home.  In the setting of LV dysfxn, she will need ischemic evaluation, however this can be deferred to the outpt setting.  She did have moderate calcific thoracic Ao atherosclerosis on chest CT in 2015.  With progressive CKD, she is a poor cath candidate, and thus we'd plan on a lexiscan myoview.  With ongoing elevations in BP, will transition metoprolol to carvedilol.  No acei/arb/arni/mra in the setting of worsening renal failure.  Would consider hydral/nitrate for additional bp mgmt/afterload reduction if necessary.  2.  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  Nephrology consulting this AM.  Agree w/ switching to oral diuretics - defer dosing to nephrology.  3.  Essential HTN:  BPs elevated throughout admission.  Cont home dose of amlodipine.  Will change metoprolol to carvedilol for improved bp mgmt.  May need to consider hydralazine/nitrates.  4.  Diet-controlled DMII:  A1c 5.7 in 10/2017, now 5.3.  No meds @ home.  5.  Anemia of chronic disease: H/H  7.6/23.8 5/4.  Stable on blood draw this AM (7.9/24.1).  No history of bleeding.  Anemia certainly could be playing a  role in dyspnea.  Signed, Murray Hodgkins, NP 05/13/2019, 11:35 AM  For questions or updates, please contact   Please consult [email or web address] for contact info under Cardiology/STEMI.

## 2019-05-13 NOTE — Progress Notes (Signed)
Tele called and reported 6 beats of wide complex tachycardia.  Pt sleeping.  Son sitting at bs.  Pt awakened to get v/s's.  Pt asymptomatic and vs's stable.  NSR at this time.

## 2019-05-13 NOTE — Progress Notes (Signed)
PROGRESS NOTE  JANNEY PRIEGO NLZ:[id number] DOB: October 14, 1940 DOA: 05/10/2019 PCP: [facility] [place]  Brief History   Robin Rogers is an 79 y.o. 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  Patient noted that she could not lay down flat because she was short of breath and felt improved when she sat back up.  Prior to this and during this time patient had no chest pain diaphoresis although she has been nauseated for the past 4 days.  Patient decided to come to the hospital today because she could not sleep at all last night because of inability to lay down due to shortness of breath.  Review of systems notable for nausea as noted above.  No vomiting.  She does admit to anorexia.  She is also noted new onset lower extremity edema for the past 3 days.  Patient denies any new medications other than what she is prescribed at the [facility].  Denies taking any NSAIDs.  Patient specifically denies any fevers or chills or cough or body aches or much malaise or myalgia.  ED Course:  The patient is noted to be a little bit short of breath but no hypoxia on room air.  Chest x-ray showed some mild interstitial vascular congestion.  BNP was noted to be 700.  She was noted to be in acute kidney failure with creatinine of 4.3 up from 2.4.  . The patient has been admitted to a telemetry bed. She is being diuresed. She is currently negative 3.45L in terms of volume status. Echocardiogram has been completed. It  demonstrates EF of 40-45 % with global hypokinesis of left ventricle and grade III diastolic dysfunction. Cardiology and Nephrology have been consulted.  Consultants  . Cardiology  Procedures  . None  Antibiotics    Anti-infectives (From admission, onward)   None     Subjective  The patient tells me that she is feeling better. Denies pain. Spouse is translating for patient.  Objective   Vitals:  Vitals:   05/13/19 1436 05/13/19 1551  BP:  (!) 131/47  Pulse: 89 66  Resp:  16  Temp:  98.3 F (36.8 C)  SpO2: 97% 98%  Exam:  Constitutional:  . The patient is awake, alert, and oriented x 3. No acute distress. Respiratory:  . No increased work of breathing. . No wheezes or rhonchi . Positive for scattered rales bilaterally. . No tactile fremitus . Diminished breath sounds bilaterally. Cardiovascular:  . Regular rate and rhythm . No murmurs, ectopy, or gallups. . No lateral PMI. No thrills. Abdomen:  . Abdomen is soft, non-tender, non-distended . No hernias, masses, or organomegaly . Normoactive bowel sounds.  Musculoskeletal:  . No cyanosis or clubbing . Positive for 2+ edema Lower extremity.bilaterally Skin:  . No rashes, lesions, ulcers . palpation of skin: no induration or nodules Neurologic:  . CN 2-12 intact . Sensation all 4 extremities intact Psychiatric:  . Mental status o Mood, affect appropriate o Orientation to person, place, time  . judgment and insight appear intact  I have personally reviewed the following:   Today's Data  . Vitals, BMP, BNP  Imaging  . CXR: Bibasilar subsegmental atelectasis. Pulmonary congestion and mild pulmonary interstitial prominence.   Cardiology  Data  . Echo demonstrates EF of 40-45 % with global hypokinesis of left ventricle and grade III diastolic dysfunction  Scheduled Meds: . aspirin EC  81 mg Oral Daily  . carvedilol  6.25 mg Oral BID WC  . epoetin (EPOGEN/PROCRIT) injection  4,000 Units Subcutaneous Once  . furosemide  40 mg Oral BID  . heparin  5,000 Units Subcutaneous Q8H  . hydrALAZINE  10 mg Oral TID  . insulin aspart  0-15 Units Subcutaneous TID WC  . insulin aspart  0-5 Units Subcutaneous QHS  . lactulose  10 g  Oral TID  . pantoprazole  40 mg Oral Daily  . pravastatin  40 mg Oral q1800  . sodium chloride flush  3 mL Intravenous Q12H   Continuous Infusions: . sodium chloride      Active Problems:   Chronic systolic heart failure (HCC)   HTN (hypertension)   DM (diabetes mellitus) ([place])   ARF (acute renal failure) (HCC)   Acute on chronic heart failure with reduced ejection fraction and diastolic dysfunction (HCC)   LOS: 3 days   A & P  Acute on chronic combined systolic and diastolic heart failure: Because of acute decompensation is not entirely clear.  No evidence for ischemia.  No new medications.  Unsure whether this patient had had an oral sodium load.  Echocardiogram in 2019 shows ejection fraction 45% with stage II diastolic  dysfunction. Repeated echocardiogram demonstrates  demonstrates EF of 40-45  % with global hypokinesis of left ventricle and grade III diastolic dysfunction  The patient's fluid balance is negative 3.45 L since admission. Continue diuresis,  metoprolol. Cardiology has been consulted. They recommend decreasing the dose or oral diuretics and consideration of an ischemic work up as outpatient.   Chronic kidney disease Stage V.: Baseline creatinine is 2.4 from our notes however patient may have progressed since 2019 which is when her last creatinine is from.  She is followed at [name]. Possibly patient has worsened kidney failure due to decreased forward flow and heart failure. BUN is 59, I do not think patient is uremic although she is having nausea of unclear etiology for the past 5 days. Monitor creatinine, electrolytes, and volume status. Avoid nephrotoxins and hypotension. I appreciate nephrology's assistance.  DM2: Patient does not appear to be on any diabetes medications as an outpatient. We will place patient on CBGs 3 times daily AC x3 days. We will not order any SSI for now. Glucoses for the past 24 hours have run from 75 to 124.  HTN: Blood  pressures are a little high on metoprolol 25 mg bid alone. Will add back norvasc. Monitor.  Anemia: Check iron studies and FOBT. Monitor hemoglobin. Transfuse for hemoglobin less than 7.0. DDX includes iron deficiency anemia, blood loss anemia, anemia due to chronic disease including CKD V.  Blindness: Noted, chronic and stable.  I have seen and examined this patient myself. I have spent 35 minutes in her evaluation and care.  DVT prophylaxis: Subcu heparin ordered. Code Status: Full Family Communication: None available Disposition Plan: Home  Precious Segall, DO [facility] Direct contact: see [email or web address]  7PM-7AM contact night coverage as above 05/13/2019, 6:00 PM  LOS: 1 day

## 2019-05-13 NOTE — Progress Notes (Signed)
Mag level called to NP  Sharion Settler.

## 2019-05-13 NOTE — Consult Note (Signed)
[address] [place], [place] [address] Phone [phone]. Fax [phone]  Date: 05/13/2019                  Patient Name:  Robin Rogers  MRN: [id number]  DOB: 1940/06/28  Age / Sex: 79 y.o., female         PCP: [facility] [place]                 Service Requesting Consult: IM/ Swayze, Ava, DO                 Reason for Consult: CKD            History of Present Illness: Patient is a 79 y.o. female with medical problems of blindness, heart failure, CKD, diabetes, who was admitted to [facility] on 05/10/2019 for evaluation of chest tightness and shortness of breath.  Conversation through [place] interpreter although patient speaks a different dialect of Spanish Patient reports that about week prior to admission she experienced some dyspnea on exertion.  Some shortness of breath with laying down.  She has some chest tightness.  She also reports decreased appetite, some nausea and abdominal discomfort.  She presented to the emergency room where she was found to have high blood pressure and lower extremity edema.  She was given IV Lasix with good response. Urine output 3100 cc, 1700 cc last 2 days respectively Serum creatinine noted to be elevated at 4.31.  Baseline creatinine of 2.45/GFR 18 from September 2019    Medications: Outpatient medications: Medications Prior to Admission  Medication Sig Dispense Refill Last Dose  . amLODipine (NORVASC) 10 MG tablet Take 1 tablet by mouth daily.     Marland Kitchen lovastatin (MEVACOR) 40 MG tablet Take 1 tablet by mouth at bedtime.      . metoprolol tartrate (LOPRESSOR) 25 MG tablet Take 1 tablet by mouth 2 (two) times daily.     . pantoprazole (PROTONIX) 40 MG tablet Take 1 tablet (40 mg total) by mouth daily. (Patient not taking: Reported on 05/10/2019) 30 tablet 0 Not Taking at Unknown time  . polyethylene glycol (MIRALAX / GLYCOLAX) packet Take 17 g by mouth daily. (Patient not taking: Reported on 05/10/2019) 14 each 0 Not  Taking at Unknown time    Current medications: Current Facility-Administered Medications  Medication Dose Route Frequency Provider Last Rate Last Admin  . 0.9 %  sodium chloride infusion  250 mL Intravenous PRN Bonnell Public Tublu, MD      . acetaminophen (TYLENOL) tablet 650 mg  650 mg Oral Q4H PRN Vashti Hey, MD      . aspirin EC tablet 81 mg  81 mg Oral Daily Bonnell Public Tublu, MD   81 mg at 05/13/19 0900  . furosemide (LASIX) tablet 40 mg  40 mg Oral BID Swayze, Ava, DO   40 mg at 05/13/19 1135  . guaiFENesin-dextromethorphan (ROBITUSSIN DM) 100-10 MG/5ML syrup 5 mL  5 mL Oral Q4H PRN Swayze, Ava, DO   5 mL at 05/12/19 1517  . heparin injection 5,000 Units  5,000 Units Subcutaneous Q8H Vashti Hey, MD   5,000 Units at 05/13/19 0504  . insulin aspart (novoLOG) injection 0-15 Units  0-15 Units Subcutaneous TID WC Swayze, Ava, DO      . insulin aspart (novoLOG) injection 0-5 Units  0-5 Units Subcutaneous QHS Swayze, Ava, DO      . lactulose (CHRONULAC) 10 GM/15ML solution 10 g  10 g Oral TID Swayze,  Ava, DO   10 g at 05/12/19 0937  . metoprolol tartrate (LOPRESSOR) tablet 25 mg  25 mg Oral BID Bonnell Public Tublu, MD   25 mg at 05/13/19 0900  . ondansetron (ZOFRAN) injection 4 mg  4 mg Intravenous Q6H PRN Bonnell Public Tublu, MD      . pantoprazole (PROTONIX) EC tablet 40 mg  40 mg Oral Daily Bonnell Public Tublu, MD   40 mg at 05/13/19 0900  . pravastatin (PRAVACHOL) tablet 40 mg  40 mg Oral q1800 Bonnell Public Tublu, MD   40 mg at 05/12/19 1645  . sodium chloride flush (NS) 0.9 % injection 3 mL  3 mL Intravenous Q12H Bonnell Public Tublu, MD   3 mL at 05/13/19 0900  . sodium chloride flush (NS) 0.9 % injection 3 mL  3 mL Intravenous PRN Jamse Arn Kyra Searles, MD          Allergies: No Known Allergies    Past Medical History: Past Medical History:  Diagnosis Date  . Anemia of chronic disease   . Blindness    . Chronic combined systolic (congestive) and diastolic (congestive) heart failure ([place])    a. 09/2017 Echo: EF 45-50%, Gr2 DD; b. 05/2019 Echo: EF 40-45%, Gr III DD (restrictive), nl RV fxn, mod elev PASP. Mod dil LA. Mild MR.  . CKD (chronic kidney disease), stage IV ([place])   . Diet-controlled diabetes mellitus ([place])   . Hypertension      Past Surgical History: Past Surgical History:  Procedure Laterality Date  . CHOLECYSTECTOMY    . ESOPHAGOGASTRODUODENOSCOPY N/A 09/24/2017   Procedure: ESOPHAGOGASTRODUODENOSCOPY (EGD);  Surgeon: Toledo, Benay Pike, MD;  Location: [facility] ENDOSCOPY;  Service: Gastroenterology;  Laterality: N/A;     Family History: History reviewed. No pertinent family history.   Social History: Social History   Socioeconomic History  . Marital status: Married    Spouse name: Not on file  . Number of children: Not on file  . Years of education: Not on file  . Highest education level: Not on file  Occupational History  . Not on file  Tobacco Use  . Smoking status: Never Smoker  . Smokeless tobacco: Never Used  Substance and Sexual Activity  . Alcohol use: No  . Drug use: Never  . Sexual activity: Not Currently  Other Topics Concern  . Not on file  Social History Narrative  . Not on file   Social Determinants of Health   Financial Resource Strain:   . Difficulty of Paying Living Expenses:   Food Insecurity:   . Worried About Charity fundraiser in the Last Year:   . Arboriculturist in the Last Year:   Transportation Needs:   . Film/video editor (Medical):   Marland Kitchen Lack of Transportation (Non-Medical):   Physical Activity:   . Days of Exercise per Week:   . Minutes of Exercise per Session:   Stress:   . Feeling of Stress :   Social Connections:   . Frequency of Communication with Friends and Family:   . Frequency of Social Gatherings with Friends and Family:   . Attends Religious Services:   . Active Member of Clubs or Organizations:   .  Attends Archivist Meetings:   Marland Kitchen Marital Status:   Intimate Partner Violence:   . Fear of Current or Ex-Partner:   . Emotionally Abused:   Marland Kitchen Physically Abused:   . Sexually Abused:      Review of Systems: Gen:  Denies any fevers or chills HEENT: Patient is blind CV: Some shortness of breath, orthopnea, chest tightness Resp: No cough or sputum production GI: No diarrhea, fair appetite GU : No problems with voiding.  No blood in the urine MS: Denies any problems with ambulation Derm:    No complaints Psych: No complaints Heme: No complaints Neuro: No complaints Endocrine.  History of diabetes.  Now diet controlled  Vital Signs: Blood pressure (!) 147/53, pulse 89, temperature 98 F (36.7 C), temperature source Oral, resp. rate 16, height 5\' 2"  (1.575 m), weight 71.1 kg, SpO2 97 %.   Intake/Output Summary (Last 24 hours) at 05/13/2019 1444 Last data filed at 05/13/2019 1433 Gross per 24 hour  Intake 480 ml  Output 1800 ml  Net -1320 ml    Weight trends: Filed Weights   05/11/19 0118 05/12/19 0441 05/13/19 0429  Weight: 73.5 kg 72 kg 71.1 kg    Physical Exam: General:  No acute distress, laying in the bed  HEENT  corneal opacities, patient is blind  Neck:  Supple, no JVD  Lungs:  Normal breathing effort, decreased breath sounds at bases  Heart::  No rub or gallop  Abdomen:  Soft, nontender  Extremities:  Trace edema  Neurologic:  Alert, able to answer questions  Skin:  No acute rashes  Access:   Foley:        Lab results: Basic Metabolic Panel: Recent Labs  Lab 05/11/19 0505 05/12/19 0501 05/13/19 0408  NA 138 133* 138  K 5.4* 4.8 4.8  CL 106 103 106  CO2 23 22 26   GLUCOSE 96 96 84  BUN 62* 64* 70*  CREATININE 4.35* 4.22* 4.43*  CALCIUM 7.4* 7.1* 7.3*  MG  --   --  1.4*    Liver Function Tests: No results for input(s): AST, ALT, ALKPHOS, BILITOT, PROT, ALBUMIN in the last 168 hours. Recent Labs  Lab 05/10/19 1455  LIPASE 21   No  results for input(s): AMMONIA in the last 168 hours.  CBC: Recent Labs  Lab 05/10/19 1455 05/13/19 1114  WBC 5.3 5.2  HGB 7.6* 7.8*  HCT 23.8* 24.1*  MCV 100.4* 98.8  PLT 215 230    Cardiac Enzymes: No results for input(s): CKTOTAL, TROPONINI in the last 168 hours.  BNP: Invalid input(s): POCBNP  CBG: Recent Labs  Lab 05/12/19 1127 05/12/19 1640 05/12/19 2106 05/13/19 0737 05/13/19 1133  GLUCAP 85 87 156* 75 85    Microbiology: Recent Results (from the past 720 hour(s))  Respiratory Panel by RT PCR (Flu A&B, Covid) - Nasopharyngeal Swab     Status: None   Collection Time: 05/10/19  4:14 PM   Specimen: Nasopharyngeal Swab  Result Value Ref Range Status   SARS Coronavirus 2 by RT PCR NEGATIVE NEGATIVE Final    Comment: (NOTE) SARS-CoV-2 target nucleic acids are NOT DETECTED. The SARS-CoV-2 RNA is generally detectable in upper respiratoy specimens during the acute phase of infection. The lowest concentration of SARS-CoV-2 viral copies this assay can detect is 131 copies/mL. A negative result does not preclude SARS-Cov-2 infection and should not be used as the sole basis for treatment or other patient management decisions. A negative result may occur with  improper specimen collection/handling, submission of specimen other than nasopharyngeal swab, presence of viral mutation(s) within the areas targeted by this assay, and inadequate number of viral copies (<131 copies/mL). A negative result must be combined with clinical observations, patient history, and epidemiological information. The expected result is Negative. Fact  Sheet for Patients:  PinkCheek.be Fact Sheet for Healthcare Providers:  GravelBags.it This test is not yet ap proved or cleared by the [place] FDA and  has been authorized for detection and/or diagnosis of SARS-CoV-2 by FDA under an Emergency Use Authorization (EUA). This EUA will  remain  in effect (meaning this test can be used) for the duration of the COVID-19 declaration under Section 564(b)(1) of the Act, 21 U.S.C. section 360bbb-3(b)(1), unless the authorization is terminated or revoked sooner.    Influenza A by PCR NEGATIVE NEGATIVE Final   Influenza B by PCR NEGATIVE NEGATIVE Final    Comment: (NOTE) The Xpert Xpress SARS-CoV-2/FLU/RSV assay is intended as an aid in  the diagnosis of influenza from Nasopharyngeal swab specimens and  should not be used as a sole basis for treatment. Nasal washings and  aspirates are unacceptable for Xpert Xpress SARS-CoV-2/FLU/RSV  testing. Fact Sheet for Patients: PinkCheek.be Fact Sheet for Healthcare Providers: GravelBags.it This test is not yet approved or cleared by the [place] FDA and  has been authorized for detection and/or diagnosis of SARS-CoV-2 by  FDA under an Emergency Use Authorization (EUA). This EUA will remain  in effect (meaning this test can be used) for the duration of the  Covid-19 declaration under Section 564(b)(1) of the Act, 21  U.S.C. section 360bbb-3(b)(1), unless the authorization is  terminated or revoked. Performed at [facility], [address] [place], [place] [address]      Coagulation Studies: No results for input(s): LABPROT, INR in the last 72 hours.  Urinalysis: No results for input(s): COLORURINE, LABSPEC, PHURINE, GLUCOSEU, HGBUR, BILIRUBINUR, KETONESUR, PROTEINUR, UROBILINOGEN, NITRITE, LEUKOCYTESUR in the last 72 hours.  Invalid input(s): APPERANCEUR      Imaging:  No results found.   Assessment & Plan: Pt is a 79 y.o. Hispanic  female with  Congestive heart failure Hypertension Diabetes CKD 5 Blindness   was admitted on 05/10/2019 with Shortness of breath [R06.02] Acute on chronic heart failure with reduced ejection fraction and diastolic dysfunction ([place]) [I50.43]   #Chronic kidney  disease stage V Last year in 2019, patient had creatinine of 2.45, GFR 18.  Patient used to see nephrologist at [facility] but has not gone there for few years now It appears that her underlying CKD has progressed to stage V Patient does not have any uremic symptoms at present No acute indication for dialysis We will continue to monitor closely Underlying cause for CKD appears to be diabetes, atherosclerosis and hypertension Patient has previous history of proteinuria of 3.8 g in September 2019  #Anemia Likely of chronic kidney disease Previously kappa to lambda ratio and SPEP normal in September 2019 We will order subcu Epogen and iron studies in the morning If low transferrin saturation, consider IV iron infusion   #Bone and mineral metabolism Ordered PTH with a.m. labs  #Hypertension with lower extremity edema Agree with furosemide twice a day Avoid ACE inhibitor or ARB at this time due to advanced CKD and aggressive diuresis   Follow-up as outpatient     LOS: 3 Tarini Carrier 5/7/20212:44 PM    Note: This note was prepared with Dragon dictation. Any transcription errors are unintentional

## 2019-05-13 NOTE — Progress Notes (Signed)
Potable interpreter was provided for pt and family but refused at this time. Will continue to monitor.

## 2019-05-13 NOTE — Progress Notes (Signed)
Ambulated patient around nurses station x1 and tolerated well. O2 sats remained at 98% on r/a. Will continue to monitor.

## 2019-05-13 NOTE — Progress Notes (Signed)
   05/13/19 1400  ReDS Vest / Clip  Station Marker A  Ruler Value 35  Anatomical Comments low quality  Unable to obtain RedsVest Reading due to poor quality. attemptx3 unsucessful

## 2019-05-14 LAB — BASIC METABOLIC PANEL
Anion gap: 10 (ref 5–15)
BUN: 75 mg/dL — ABNORMAL HIGH (ref 8–23)
CO2: 26 mmol/L (ref 22–32)
Calcium: 7.8 mg/dL — ABNORMAL LOW (ref 8.9–10.3)
Chloride: 103 mmol/L (ref 98–111)
Creatinine, Ser: 4.45 mg/dL — ABNORMAL HIGH (ref 0.44–1.00)
GFR calc Af Amer: 10 mL/min — ABNORMAL LOW (ref >60)
GFR calc non Af Amer: 9 mL/min — ABNORMAL LOW (ref >60)
Glucose, Bld: 100 mg/dL — ABNORMAL HIGH (ref 70–99)
Potassium: 4.7 mmol/L (ref 3.5–5.1)
Sodium: 139 mmol/L (ref 135–145)

## 2019-05-14 LAB — IRON AND TIBC
Iron: 60 ug/dL (ref 28–170)
Saturation Ratios: 25 % (ref 10.4–31.8)
TIBC: 244 ug/dL — ABNORMAL LOW (ref 250–450)
UIBC: 184 ug/dL

## 2019-05-14 LAB — GLUCOSE, CAPILLARY
Glucose-Capillary: 93 mg/dL (ref 70–99)
Glucose-Capillary: 154 mg/dL — ABNORMAL HIGH (ref 70–99)
Glucose-Capillary: 78 mg/dL (ref 70–99)
Glucose-Capillary: 86 mg/dL (ref 70–99)

## 2019-05-14 LAB — PREPARE RBC (CROSSMATCH)

## 2019-05-14 LAB — FERRITIN: Ferritin: 186 ng/mL (ref 11–307)

## 2019-05-14 LAB — HEMOGLOBIN AND HEMATOCRIT, BLOOD
HCT: 27.6 % — ABNORMAL LOW (ref 36.0–46.0)
Hemoglobin: 9.1 g/dL — ABNORMAL LOW (ref 12.0–15.0)

## 2019-05-14 LAB — ABO/RH: ABO/RH(D): O POS

## 2019-05-14 LAB — TRANSFERRIN: Transferrin: 179 mg/dL — ABNORMAL LOW (ref 192–382)

## 2019-05-14 MED ORDER — DIPHENHYDRAMINE HCL 25 MG PO CAPS
25.0000 mg | ORAL_CAPSULE | Freq: Once | ORAL | Status: AC
  Administered 2019-05-14: 25 mg via ORAL
  Filled 2019-05-14: qty 1

## 2019-05-14 MED ORDER — SODIUM CHLORIDE 0.9% IV SOLUTION: Freq: Once | INTRAVENOUS | Status: AC

## 2019-05-14 MED ORDER — ACETAMINOPHEN 325 MG PO TABS
650.0000 mg | ORAL_TABLET | Freq: Once | ORAL | Status: AC
  Administered 2019-05-14: 650 mg via ORAL
  Filled 2019-05-14: qty 2

## 2019-05-14 MED ORDER — FUROSEMIDE 10 MG/ML IJ SOLN
20.0000 mg | Freq: Once | INTRAMUSCULAR | Status: AC
  Administered 2019-05-14: 14:00:00 20 mg via INTRAVENOUS
  Filled 2019-05-14: qty 2

## 2019-05-14 NOTE — Progress Notes (Addendum)
Blood bank called to notify pt only qualifies for 1 unit of RBC right now. MD notified. Daughter signed consent form for pt as pt is blind and gives daughter consent to sign.   MD asked about heparin subcu order. Patient has been up to the [facility] multiple times today. Awaiting for response by MD.

## 2019-05-14 NOTE — Progress Notes (Signed)
Progress Note  Patient Name: Robin Rogers Date of Encounter: 05/14/2019  Primary Cardiologist: Ida Rogue, MD   Subjective   Daughter in room today, translates, declined additional Spanish interpreter. Doing well overall. Tolerating medications. Breathing is better. No additional questions today.  Inpatient Medications    Scheduled Meds: . sodium chloride   Intravenous Once  . acetaminophen  650 mg Oral Once  . aspirin EC  81 mg Oral Daily  . carvedilol  6.25 mg Oral BID WC  . diphenhydrAMINE  25 mg Oral Once  . furosemide  20 mg Intravenous Once  . furosemide  40 mg Oral BID  . heparin  5,000 Units Subcutaneous Q8H  . hydrALAZINE  10 mg Oral TID  . insulin aspart  0-15 Units Subcutaneous TID WC  . insulin aspart  0-5 Units Subcutaneous QHS  . lactulose  10 g Oral TID  . pantoprazole  40 mg Oral Daily  . pravastatin  40 mg Oral q1800  . sodium chloride flush  3 mL Intravenous Q12H   Continuous Infusions: . sodium chloride     PRN Meds: sodium chloride, acetaminophen, guaiFENesin-dextromethorphan, ondansetron (ZOFRAN) IV, sodium chloride flush   Vital Signs    Vitals:   05/13/19 1551 05/13/19 2008 05/14/19 0355 05/14/19 0743  BP: (!) 131/47 (!) 138/48 (!) 130/45 (!) 142/50  Pulse: 66 65 67 67  Resp: 16 20 20 19   Temp: 98.3 F (36.8 C) 98.3 F (36.8 C) 97.9 F (36.6 C) 98.8 F (37.1 C)  TempSrc: Oral Oral Oral Oral  SpO2: 98% 96% 98% 97%  Weight:   68 kg   Height:        Intake/Output Summary (Last 24 hours) at 05/14/2019 1220 Last data filed at 05/14/2019 0939 Gross per 24 hour  Intake 250 ml  Output 2450 ml  Net -2200 ml   Last 3 Weights 05/14/2019 05/13/2019 05/12/2019  Weight (lbs) 150 lb 156 lb 11.2 oz 158 lb 11.2 oz  Weight (kg) 68.04 kg 71.079 kg 71.986 kg      Telemetry    NSR- Personally Reviewed  ECG    5/4 NSR, nonspecific ST-T changes - Personally Reviewed  Physical Exam   GEN: No acute distress.   Neck: No JVD Cardiac: RRR, no  murmurs, rubs, or gallops.  Respiratory: Clear to auscultation bilaterally except for faint bibasilar crackles GI: Soft, nontender, non-distended  MS: No edema; No deformity. Neuro:  Nonfocal. Blind. Psych: Normal affect   Labs    High Sensitivity Troponin:   Recent Labs  Lab 05/10/19 1455 05/10/19 1651  TROPONINIHS 14 13      Chemistry Recent Labs  Lab 05/12/19 0501 05/13/19 0408 05/14/19 0450  NA 133* 138 139  K 4.8 4.8 4.7  CL 103 106 103  CO2 22 26 26   GLUCOSE 96 84 100*  BUN 64* 70* 75*  CREATININE 4.22* 4.43* 4.45*  CALCIUM 7.1* 7.3* 7.8*  GFRNONAA 10* 9* 9*  GFRAA 11* 10* 10*  ANIONGAP 8 6 10      Hematology Recent Labs  Lab 05/10/19 1455 05/13/19 1114  WBC 5.3 5.2  RBC 2.37* 2.44*  HGB 7.6* 7.8*  HCT 23.8* 24.1*  MCV 100.4* 98.8  MCH 32.1 32.0  MCHC 31.9 32.4  RDW 12.7 12.4  PLT 215 230    BNP Recent Labs  Lab 05/10/19 1605  BNP 702.0*     DDimer No results for input(s): DDIMER in the last 168 hours.   Radiology  No results found.  Cardiac Studies   Echo 05/11/19 1. Left ventricular ejection fraction, by estimation, is 40 to 45%. The  left ventricle has mild to moderately decreased function. The left  ventricle demonstrates global hypokinesis. Left ventricular diastolic  parameters are consistent with Grade III  diastolic dysfunction (restrictive).  2. Right ventricular systolic function is normal. The right ventricular  size is normal. There is moderately elevated pulmonary artery systolic  pressure.  3. Left atrial size was moderately dilated.  4. The mitral valve is grossly normal. Mild mitral valve regurgitation.  5. The aortic valve is grossly normal. Aortic valve regurgitation is not  visualized.  6. The inferior vena cava is dilated in size with <50% respiratory  variability, suggesting right atrial pressure of 15 mmHg.   Patient Profile     79 y.o. female with PMH chronic systolic and diastolic heart failure,  hypertension, diet controlled type II diabetes, chronic kidney disease now stage 5, blindness who presented with worsening shortness of breath and orthopnea consistent with acute on chronic diastolic heart failure  Assessment & Plan    Acute on chronic systolic and diastolic heart failure: cardiomyopathy, unclear etiology -presented with shortness of breath, orthopnea. Had bibasilar crackles and LE edema on presentation -net negative 7.6 L. Admission weight 75 kg, weight this AM 68 kg. -no ACEi/ARB/ARNI/MRA given chronic kidney disease stage 5 -recommend outpatient ischemia evaluation (lexiscan myoview, no contrast) -appreciate nephrology input, will defer diuretics dosing to them. Recommendation yesterday for BID lasix -no LE edema, faint basilar crackles, otherwise appears euvolemic  Peripheral arterial disease -aortic atherosclerosis -reportedly on lovastatin at home, on pravastatin during admission -continue aspirin 81 mg  Hypertension: has low diastolic number, need to be cautious of overaggressive treatment to avoid low MAPs -this AM 142/50, has been as low as 130/45 -on carvedilol 6.25 mg BID -On hydralazine 10 mg TID -no ACEi/ARB/ARNI/MRA given renal disease  Acute kidney injury on chronic kidney disease, now stage 5: -Cr 4.3 on admission, prior 2.4 -Cr this AM 4.45 -Hgb 7.8 (ideal closer to 10 given heart failure) -likely anemia of chronic disease, but defer to primary team for further evaluation/management -appreciate nephrology involvement, as above  [facility] will sign off.   Medication Recommendations:  As currently ordered (ok to return to prior home statin) Other recommendations (labs, testing, etc):  none Follow up as an outpatient:  We will arrange for follow up in the office. Her daughter is off work on Mondays and requests a Monday appt so that she can accompany the patient.  For questions or updates, please contact [place] Please consult  [email or web address] for contact info under     Signed, Buford Dresser, MD  05/14/2019, 12:20 PM

## 2019-05-14 NOTE — Plan of Care (Signed)
  Problem: Clinical Measurements: Goal: Ability to maintain clinical measurements within normal limits will improve Outcome: Progressing Goal: Respiratory complications will improve Outcome: Progressing   Problem: Safety: Goal: Ability to remain free from injury will improve Outcome: Progressing   

## 2019-05-14 NOTE — Progress Notes (Addendum)
Potable interpreter was used for the pt. Mafer # [phone] was utilized to help answers pt questions. Will continue to monitor.  Update 2232: Pt hgb went up to 9.1 from 7.8 after 1 unit of blood transfusion. Notify NP Randol Kern. Will continue to monitor.

## 2019-05-14 NOTE — Progress Notes (Signed)
PROGRESS NOTE  Robin Rogers PNT:[id number] DOB: [birth year] DOA: 05/10/2019 PCP: [facility] [place]  Brief History   Robin Rogers is an 79 y.o. 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  Patient noted that she could not lay down flat because she was short of breath and felt improved when she sat back up.  Prior to this and during this time patient had no chest pain diaphoresis although she has been nauseated for the past 4 days.  Patient decided to come to the hospital today because she could not sleep at all last night because of inability to lay down due to shortness of breath.  Review of systems notable for nausea as noted above.  No vomiting.  She does admit to anorexia.  She is also noted new onset lower extremity edema for the past 3 days.  Patient denies any new medications other than what she is prescribed at the [facility] clinic.  Denies taking any NSAIDs.  Patient specifically denies any fevers or chills or cough or body aches or much malaise or myalgia.  ED Course:  The patient is noted to be a little bit short of breath but no hypoxia on room air.  Chest x-ray showed some mild interstitial vascular congestion.  BNP was noted to be 700.  She was noted to be in acute kidney failure with creatinine of 4.3 up from 2.4.  The patient has been admitted to a telemetry bed. She is being diuresed. She is currently negative 3.45L in terms of volume status. Echocardiogram has been completed. It  demonstrates EF of 40-45 % with global hypokinesis of left ventricle and grade III diastolic dysfunction. Cardiology and Nephrology have been consulted.  Cardiology has recommended maintaining hemoglobin greater than 10. It is 7.8  today. The patient will be transfused with 2 units PRBC's.  Consultants  . Cardiology  Procedures  . None  Antibiotics   Anti-infectives (From admission, onward)   None     Subjective  The patient tells me that she is feeling better. Denies pain. Daughter is translating for patient. The patient has agreed to transfusion.  Objective   Vitals:  Vitals:   05/14/19 0355 05/14/19 0743  BP: (!) 130/45 (!) 142/50  Pulse: 67 67  Resp: 20 19  Temp: 97.9 F (36.6 C) 98.8 F (37.1 C)  SpO2: 98% 97%  Exam:  Constitutional:  . The patient is awake, alert, and oriented x 3. No acute distress. Respiratory:  . No increased work of breathing. . No wheezes or rhonchi . Positive for scattered rales bilaterally. . No tactile fremitus . Diminished breath sounds bilaterally. Cardiovascular:  . Regular rate and rhythm . No murmurs, ectopy, or gallups. . No lateral PMI. No thrills. Abdomen:  . Abdomen is soft, non-tender, non-distended . No hernias, masses, or organomegaly . Normoactive bowel sounds.  Musculoskeletal:  . No cyanosis or clubbing . Positive for 2+ edema Lower extremity.bilaterally Skin:  . No rashes, lesions, ulcers . palpation of skin: no induration or nodules Neurologic:  . CN 2-12 intact . Sensation all 4 extremities intact Psychiatric:  . Mental status o Mood, affect appropriate o Orientation to person, place, time  . judgment and insight appear intact  I have  personally reviewed the following:   Today's Data  . Vitals, BMP, Iron studies  Imaging  . CXR: Bibasilar subsegmental atelectasis. Pulmonary congestion and mild pulmonary interstitial prominence.   Cardiology Data  . Echo demonstrates EF of 40-45 % with global hypokinesis of left ventricle and grade III diastolic dysfunction  Scheduled Meds: . sodium chloride   Intravenous Once  . acetaminophen  650 mg Oral Once  . aspirin EC  81 mg Oral Daily  . carvedilol  6.25 mg Oral BID WC  .  diphenhydrAMINE  25 mg Oral Once  . furosemide  20 mg Intravenous Once  . furosemide  40 mg Oral BID  . heparin  5,000 Units Subcutaneous Q8H  . hydrALAZINE  10 mg Oral TID  . insulin aspart  0-15 Units Subcutaneous TID WC  . insulin aspart  0-5 Units Subcutaneous QHS  . lactulose  10 g Oral TID  . pantoprazole  40 mg Oral Daily  . pravastatin  40 mg Oral q1800  . sodium chloride flush  3 mL Intravenous Q12H   Continuous Infusions: . sodium chloride      Active Problems:   Chronic systolic heart failure (HCC)   HTN (hypertension)   DM (diabetes mellitus) ([place])   ARF (acute renal failure) (HCC)   Acute on chronic heart failure with reduced ejection fraction and diastolic dysfunction (HCC)   LOS: 4 days   A & P  Acute on chronic combined systolic and diastolic heart failure: Because of acute decompensation is not entirely clear.  No evidence for ischemia.  No new medications.  Unsure whether this patient had had an oral sodium load.  Echocardiogram in 2019 shows ejection fraction 45% with stage II diastolic  dysfunction. Repeated echocardiogram demonstrates  demonstrates EF of 40-45  % with global hypokinesis of left ventricle and grade III diastolic dysfunction  The patient's fluid balance is negative 3.45 L since admission. Continue diuresis,  metoprolol. Cardiology has been consulted. They recommend decreasing the dose or oral diuretics and consideration of an ischemic work up as outpatient.   Chronic kidney disease Stage V.: Baseline creatinine is 2.4 from our notes however patient may have progressed since 2019 which is when her last creatinine is from.  She is followed at [name]. Possibly patient has worsened kidney failure due to decreased forward flow and heart failure. BUN is 59, I do not think patient is uremic although she is having nausea of unclear etiology for the past 5 days. Monitor creatinine, electrolytes, and volume status. Avoid nephrotoxins and  hypotension. I appreciate nephrology's assistance.  DM2: Patient does not appear to be on any diabetes medications as an outpatient. We will place patient on CBGs 3 times daily AC x3 days. We will not order any SSI for now. Glucoses for the past 24 hours have run from 75 to 124.  HTN: Blood pressures are a little high on metoprolol 25 mg bid alone. Will add back norvasc. Monitor.  Anemia: Check iron studies and FOBT. Monitor hemoglobin. Transfuse for hemoglobin less than 7.0. DDX includes iron deficiency anemia, blood loss anemia, anemia due to chronic disease including CKD V. Cardiology has recommended maintaining hemoglobin greater than 10. It is 7.8 today. The patient will be transfused with 2 units PRBC's.  Blindness: Noted, chronic(years per daughter) and stable.  I have seen and examined this patient myself. I have spent 30 minutes in her evaluation and care.  DVT prophylaxis: Subcu heparin ordered. Code Status: Full Family Communication: None  available Disposition Plan: The patient is from home. Anticipate discharge from home. Barriers to discharge include need for transfusion and completion of work up per cardiology and nephrology.  Endre Coutts, DO [facility] Direct contact: see [email or web address]  7PM-7AM contact night coverage as above 05/14/2019, 11:58 AM  LOS: 1 day

## 2019-05-14 NOTE — Plan of Care (Signed)
  Problem: Health Behavior/Discharge Planning: Goal: Ability to manage health-related needs will improve Outcome: Progressing   Problem: Clinical Measurements: Goal: Respiratory complications will improve Outcome: Progressing   Problem: Activity: Goal: Risk for activity intolerance will decrease Outcome: Progressing   Problem: Pain Managment: Goal: General experience of comfort will improve Outcome: Progressing   Problem: Safety: Goal: Ability to remain free from injury will improve Outcome: Progressing   

## 2019-05-14 NOTE — Progress Notes (Signed)
[facility]  ROUNDING NOTE   Subjective:  Patient still with significant renal dysfunction. Creatinine currently 4.45 with an EGFR of 9. Interview was conducted with the aid of online Spanish interpreter today. We discussed the potential for renal placement therapy however patient not immediately ready to start this.   Objective:  Vital signs in last 24 hours:  Temp:  [97.8 F (36.6 C)-98.8 F (37.1 C)] 97.9 F (36.6 C) (05/08 1647) Pulse Rate:  [64-70] 67 (05/08 1647) Resp:  [18-20] 18 (05/08 1647) BP: (130-157)/(45-64) 157/58 (05/08 1647) SpO2:  [96 %-100 %] 99 % (05/08 1647) Weight:  [68 kg] 68 kg (05/08 0355)  Weight change: -3.039 kg Filed Weights   05/12/19 0441 05/13/19 0429 05/14/19 0355  Weight: 72 kg 71.1 kg 68 kg    Intake/Output: I/O last 3 completed shifts: In: 480 [P.O.:480] Out: 3450 [Urine:3450]   Intake/Output this shift:  Total I/O In: 490 [P.O.:120; I.V.:30; Blood:340] Out: 100 [Urine:100]  Physical Exam: General: No acute distress  Head: Normocephalic, atraumatic. Moist oral mucosal membranes  Eyes: Anicteric  Neck: Supple, trachea midline  Lungs:  Clear to auscultation, normal effort  Heart: S1S2 no rubs  Abdomen:  Soft, nontender, bowel sounds present  Extremities: Trace peripheral edema.  Neurologic: Awake, alert, following commands  Skin: No lesions  Access: None    Basic Metabolic Panel: Recent Labs  Lab 05/10/19 1455 05/10/19 1455 05/11/19 0505 05/11/19 0505 05/12/19 0501 05/13/19 0408 05/14/19 0450  NA 134*  --  138  --  133* 138 139  K 5.0  --  5.4*  --  4.8 4.8 4.7  CL 105  --  106  --  103 106 103  CO2 21*  --  23  --  22 26 26   GLUCOSE 162*  --  96  --  96 84 100*  BUN 59*  --  62*  --  64* 70* 75*  CREATININE 4.31*  --  4.35*  --  4.22* 4.43* 4.45*  CALCIUM 7.2*   < > 7.4*   < > 7.1* 7.3* 7.8*  MG  --   --   --   --   --  1.4*  --    < > = values in this interval not displayed.    Liver Function  Tests: No results for input(s): AST, ALT, ALKPHOS, BILITOT, PROT, ALBUMIN in the last 168 hours. Recent Labs  Lab 05/10/19 1455  LIPASE 21   No results for input(s): AMMONIA in the last 168 hours.  CBC: Recent Labs  Lab 05/10/19 1455 05/13/19 1114  WBC 5.3 5.2  HGB 7.6* 7.8*  HCT 23.8* 24.1*  MCV 100.4* 98.8  PLT 215 230    Cardiac Enzymes: No results for input(s): CKTOTAL, CKMB, CKMBINDEX, TROPONINI in the last 168 hours.  BNP: Invalid input(s): POCBNP  CBG: Recent Labs  Lab 05/13/19 1547 05/13/19 2048 05/14/19 0744 05/14/19 1227 05/14/19 1635  GLUCAP 124* 95 93 154* 78    Microbiology: Results for orders placed or performed during the hospital encounter of 05/10/19  Respiratory Panel by RT PCR (Flu A&B, Covid) - Nasopharyngeal Swab     Status: None   Collection Time: 05/10/19  4:14 PM   Specimen: Nasopharyngeal Swab  Result Value Ref Range Status   SARS Coronavirus 2 by RT PCR NEGATIVE NEGATIVE Final    Comment: (NOTE) SARS-CoV-2 target nucleic acids are NOT DETECTED. The SARS-CoV-2 RNA is generally detectable in upper respiratoy specimens during the acute phase of  infection. The lowest concentration of SARS-CoV-2 viral copies this assay can detect is 131 copies/mL. A negative result does not preclude SARS-Cov-2 infection and should not be used as the sole basis for treatment or other patient management decisions. A negative result may occur with  improper specimen collection/handling, submission of specimen other than nasopharyngeal swab, presence of viral mutation(s) within the areas targeted by this assay, and inadequate number of viral copies (<131 copies/mL). A negative result must be combined with clinical observations, patient history, and epidemiological information. The expected result is Negative. Fact Sheet for Patients:  PinkCheek.be Fact Sheet for Healthcare Providers:   GravelBags.it This test is not yet ap proved or cleared by the [place] FDA and  has been authorized for detection and/or diagnosis of SARS-CoV-2 by FDA under an Emergency Use Authorization (EUA). This EUA will remain  in effect (meaning this test can be used) for the duration of the COVID-19 declaration under Section 564(b)(1) of the Act, 21 U.S.C. section 360bbb-3(b)(1), unless the authorization is terminated or revoked sooner.    Influenza A by PCR NEGATIVE NEGATIVE Final   Influenza B by PCR NEGATIVE NEGATIVE Final    Comment: (NOTE) The Xpert Xpress SARS-CoV-2/FLU/RSV assay is intended as an aid in  the diagnosis of influenza from Nasopharyngeal swab specimens and  should not be used as a sole basis for treatment. Nasal washings and  aspirates are unacceptable for Xpert Xpress SARS-CoV-2/FLU/RSV  testing. Fact Sheet for Patients: PinkCheek.be Fact Sheet for Healthcare Providers: GravelBags.it This test is not yet approved or cleared by the [place] FDA and  has been authorized for detection and/or diagnosis of SARS-CoV-2 by  FDA under an Emergency Use Authorization (EUA). This EUA will remain  in effect (meaning this test can be used) for the duration of the  Covid-19 declaration under Section 564(b)(1) of the Act, 21  U.S.C. section 360bbb-3(b)(1), unless the authorization is  terminated or revoked. Performed at [facility], [address] [place], [place] [address]     Coagulation Studies: No results for input(s): LABPROT, INR in the last 72 hours.  Urinalysis: No results for input(s): COLORURINE, LABSPEC, PHURINE, GLUCOSEU, HGBUR, BILIRUBINUR, KETONESUR, PROTEINUR, UROBILINOGEN, NITRITE, LEUKOCYTESUR in the last 72 hours.  Invalid input(s): APPERANCEUR    Imaging: No results found.   Medications:   . sodium chloride     . aspirin EC  81 mg Oral Daily   . carvedilol  6.25 mg Oral BID WC  . furosemide  40 mg Oral BID  . heparin  5,000 Units Subcutaneous Q8H  . hydrALAZINE  10 mg Oral TID  . insulin aspart  0-15 Units Subcutaneous TID WC  . insulin aspart  0-5 Units Subcutaneous QHS  . lactulose  10 g Oral TID  . pantoprazole  40 mg Oral Daily  . pravastatin  40 mg Oral q1800  . sodium chloride flush  3 mL Intravenous Q12H   sodium chloride, acetaminophen, guaiFENesin-dextromethorphan, ondansetron (ZOFRAN) IV, sodium chloride flush  Assessment/ Plan:  79 y.o. 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  1.  Chronic kidney disease stage V.  Baseline creatinine 2.45 with an EGFR of 18 however this was in 2019.  Previously patient was following with [facility] nephrology.  Patient is still making some urine at the moment.  No urgent need  for dialysis however advised the patient that she would likely need renal placement therapy in the relative near future.  She is undecided at the moment.  Patient's daughter will discuss this a bit further with her.  We will continue to monitor renal parameters closely.  2.  Anemia of chronic kidney disease.  Hemoglobin 7.8.  Undergoing blood transfusion today.  Continue to monitor CBC closely.  2.  Hypertension.  Maintain the patient on carvedilol and hydralazine.  4.  Secondary hyperparathyroidism.  Calcium low at 7.8.  We will also evaluate phosphorus and PTH.   LOS: 4 Neeti Knudtson 5/8/20216:41 PM

## 2019-05-15 DIAGNOSIS — R0602 Shortness of breath: Secondary | ICD-10-CM

## 2019-05-15 LAB — BASIC METABOLIC PANEL
Anion gap: 11 (ref 5–15)
BUN: 80 mg/dL — ABNORMAL HIGH (ref 8–23)
CO2: 26 mmol/L (ref 22–32)
Calcium: 7.8 mg/dL — ABNORMAL LOW (ref 8.9–10.3)
Chloride: 98 mmol/L (ref 98–111)
Creatinine, Ser: 4.65 mg/dL — ABNORMAL HIGH (ref 0.44–1.00)
GFR calc Af Amer: 10 mL/min — ABNORMAL LOW (ref >60)
GFR calc non Af Amer: 8 mL/min — ABNORMAL LOW (ref >60)
Glucose, Bld: 100 mg/dL — ABNORMAL HIGH (ref 70–99)
Potassium: 5.1 mmol/L (ref 3.5–5.1)
Sodium: 135 mmol/L (ref 135–145)

## 2019-05-15 LAB — TYPE AND SCREEN
ABO/RH(D): O POS
Antibody Screen: NEGATIVE
Unit division: 0

## 2019-05-15 LAB — CBC
HCT: 27.6 % — ABNORMAL LOW (ref 36.0–46.0)
Hemoglobin: 9.5 g/dL — ABNORMAL LOW (ref 12.0–15.0)
MCH: 32.3 pg (ref 26.0–34.0)
MCHC: 34.4 g/dL (ref 30.0–36.0)
MCV: 93.9 fL (ref 80.0–100.0)
Platelets: 218 10*3/uL (ref 150–400)
RBC: 2.94 MIL/uL — ABNORMAL LOW (ref 3.87–5.11)
RDW: 13.8 % (ref 11.5–15.5)
WBC: 4.9 10*3/uL (ref 4.0–10.5)
nRBC: 0 % (ref 0.0–0.2)

## 2019-05-15 LAB — BPAM RBC
Blood Product Expiration Date: 202106022359
ISSUE DATE / TIME: 202105081343
Unit Type and Rh: 5100

## 2019-05-15 LAB — GLUCOSE, CAPILLARY
Glucose-Capillary: 101 mg/dL — ABNORMAL HIGH (ref 70–99)
Glucose-Capillary: 127 mg/dL — ABNORMAL HIGH (ref 70–99)
Glucose-Capillary: 109 mg/dL — ABNORMAL HIGH (ref 70–99)

## 2019-05-15 LAB — PHOSPHORUS: Phosphorus: 6.2 mg/dL — ABNORMAL HIGH (ref 2.5–4.6)

## 2019-05-15 NOTE — Progress Notes (Signed)
PROGRESS NOTE  Robin Rogers GMW:[id number] DOB: 1940-11-13 DOA: 05/10/2019 PCP: [facility] [place]  Brief History   Robin Rogers is an 79 y.o. 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  Patient noted that she could not lay down flat because she was short of breath and felt improved when she sat back up.  Prior to this and during this time patient had no chest pain diaphoresis although she has been nauseated for the past 4 days.  Patient decided to come to the hospital today because she could not sleep at all last night because of inability to lay down due to shortness of breath.  Review of systems notable for nausea as noted above.  No vomiting.  She does admit to anorexia.  She is also noted new onset lower extremity edema for the past 3 days.  Patient denies any new medications other than what she is prescribed at the [facility] clinic.  Denies taking any NSAIDs.  Patient specifically denies any fevers or chills or cough or body aches or much malaise or myalgia.  ED Course:  The patient is noted to be a little bit short of breath but no hypoxia on room air.  Chest x-ray showed some mild interstitial vascular congestion.  BNP was noted to be 700.  She was noted to be in acute kidney failure with creatinine of 4.3 up from 2.4.  The patient has been admitted to a telemetry bed. She is being diuresed. She is currently negative 3.45L in terms of volume status. Echocardiogram has been completed. It  demonstrates EF of 40-45 % with global hypokinesis of left ventricle and grade III diastolic dysfunction. Cardiology and Nephrology have been consulted.  Cardiology has recommended maintaining hemoglobin greater than 10. It is 7.8  today. The patient will be transfused with 2 units PRBC's.  The patient's daughter plans to discuss with her about possible progression to dialysis.  Consultants  . Cardiology . Nephrology  Procedures  . None  Antibiotics   Anti-infectives (From admission, onward)   None     Subjective  The patient tells me that she is well. No new complaints.  Objective   Vitals:  Vitals:   05/15/19 0747 05/15/19 1138  BP: (!) 131/47 121/65  Pulse: 62 62  Resp: 18 17  Temp: 98.6 F (37 C) 98.2 F (36.8 C)  SpO2: 97% 99%  Exam:  Constitutional:  . The patient is awake, alert, and oriented x 3. No acute distress. Respiratory:  . No increased work of breathing. . No wheezes or rhonchi . Positive for scattered rales bilaterally. . No tactile fremitus . Diminished breath sounds bilaterally. Cardiovascular:  . Regular rate and rhythm . No murmurs, ectopy, or gallups. . No lateral PMI. No thrills. Abdomen:  . Abdomen is soft, non-tender, non-distended . No hernias, masses, or organomegaly . Normoactive bowel sounds.  Musculoskeletal:  . No cyanosis or clubbing . Positive for 2+ edema Lower extremity.bilaterally Skin:  . No rashes, lesions, ulcers . palpation of skin: no induration or nodules Neurologic:  . CN 2-12 intact . Sensation all 4 extremities intact Psychiatric:  . Mental status o Mood, affect appropriate o Orientation to person, place, time  . judgment and insight appear  intact  I have personally reviewed the following:   Today's Data  . Vitals, BMP, CBC  Imaging  . CXR: Bibasilar subsegmental atelectasis. Pulmonary congestion and mild pulmonary interstitial prominence.   Cardiology Data  . Echo demonstrates EF of 40-45 % with global hypokinesis of left ventricle and grade III diastolic dysfunction  Scheduled Meds: . aspirin EC  81 mg Oral Daily  . carvedilol  6.25 mg Oral BID WC  . furosemide  40 mg Oral BID  . heparin  5,000 Units Subcutaneous Q8H    . hydrALAZINE  10 mg Oral TID  . insulin aspart  0-15 Units Subcutaneous TID WC  . insulin aspart  0-5 Units Subcutaneous QHS  . lactulose  10 g Oral TID  . pantoprazole  40 mg Oral Daily  . pravastatin  40 mg Oral q1800  . sodium chloride flush  3 mL Intravenous Q12H   Continuous Infusions: . sodium chloride      Active Problems:   Chronic systolic heart failure (HCC)   HTN (hypertension)   DM (diabetes mellitus) ([place])   ARF (acute renal failure) (HCC)   Acute on chronic heart failure with reduced ejection fraction and diastolic dysfunction (HCC)   LOS: 5 days   A & P  Acute on chronic combined systolic and diastolic heart failure: Because of acute decompensation is not entirely clear.  No evidence for ischemia.  No new medications.  Unsure whether this patient had had an oral sodium load.  Echocardiogram in 2019 shows ejection fraction 45% with stage II diastolic  dysfunction. Repeated echocardiogram demonstrates  demonstrates EF of  40-45 % with global hypokinesis of left ventricle and grade III diastolic  dysfunction. The patient's fluid balance is negative 3.45 L since admission.  Continue diuresis, metoprolol. Cardiology has been consulted. They  recommend decreasing the dose or oral diuretics and consideration of an  ischemic work up as outpatient.   Chronic kidney disease Stage V.: Baseline creatinine is 2.4 from our notes however patient may have progressed since 2019 which is when her last creatinine is from.  She is followed at [name]. Possibly patient has worsened kidney failure due to decreased forward flow and heart failure. BUN is 59, I do not think patient is uremic although she is having nausea of unclear etiology for the past 5 days. Monitor creatinine, electrolytes, and volume status. Avoid nephrotoxins and hypotension. I appreciate nephrology's assistance. Daughter plans to discuss the possible progression to dialysis with the patient.  DM2: Patient  does not appear to be on any diabetes medications as an outpatient. We will place patient on CBGs 3 times daily AC x3 days. We will not order any SSI for now. Glucoses for the past 24 hours have run from 75 to 124.  HTN: Blood pressures are a little high on metoprolol 25 mg bid alone. Will add back norvasc. Monitor.  Anemia: Check iron studies and FOBT. Monitor hemoglobin. Transfuse for hemoglobin less than 7.0. DDX includes iron deficiency anemia, blood loss anemia, anemia due to chronic disease including CKD V. Cardiology has recommended maintaining hemoglobin greater than 10. It is 7.8 today. The patient will be transfused with 2 units PRBC's.  Blindness: Noted, chronic(years per daughter) and stable.  I have seen and examined this patient myself. I have spent 32 minutes in her evaluation and care.  DVT prophylaxis: Subcu heparin ordered. Code Status: Full Family Communication: None available Disposition Plan: The patient is from home. Anticipate discharge from home. Barriers to  discharge include need for transfusion and completion of work up per cardiology and nephrology.  Edyn Popoca, DO [facility] Direct contact: see [email or web address]  7PM-7AM contact night coverage as above 05/15/2019, 1:43 PM  LOS: 1 day

## 2019-05-15 NOTE — Plan of Care (Signed)
  Problem: Education: Goal: Knowledge of General Education information will improve Description: Including pain rating scale, medication(s)/side effects and non-pharmacologic comfort measures Outcome: Progressing   Problem: Health Behavior/Discharge Planning: Goal: Ability to manage health-related needs will improve Outcome: Progressing   Problem: Clinical Measurements: Goal: Ability to maintain clinical measurements within normal limits will improve Outcome: Not Progressing Note: Patient's G.F.R. is critically low at only 10. Will continue to monitor renal function labs for the remainder of the shift. Wenda Low [facility]

## 2019-05-15 NOTE — Progress Notes (Signed)
[facility]  ROUNDING NOTE   Subjective:  Visit conducted today with the aid of online interpreter. We again spoke with the patient regarding renal placement therapy. Patient states that she did have a chance to talk with her daughter. Patient does not want to proceed with dialysis treatment at this time.   Objective:  Vital signs in last 24 hours:  Temp:  [97.9 F (36.6 C)-98.6 F (37 C)] 98.2 F (36.8 C) (05/09 1138) Pulse Rate:  [62-67] 62 (05/09 1138) Resp:  [17-20] 17 (05/09 1138) BP: (121-157)/(46-65) 121/65 (05/09 1138) SpO2:  [97 %-99 %] 99 % (05/09 1138) Weight:  [68.6 kg] 68.6 kg (05/09 0402)  Weight change: 0.544 kg Filed Weights   05/13/19 0429 05/14/19 0355 05/15/19 0402  Weight: 71.1 kg 68 kg 68.6 kg    Intake/Output: I/O last 3 completed shifts: In: 850 [P.O.:480; I.V.:30; Blood:340] Out: 2450 [Urine:2450]   Intake/Output this shift:  Total I/O In: 720 [P.O.:720] Out: 875 [Urine:875]  Physical Exam: General: No acute distress  Head: Normocephalic, atraumatic. Moist oral mucosal membranes  Eyes: Anicteric  Neck: Supple, trachea midline  Lungs:  Clear to auscultation, normal effort  Heart: S1S2 no rubs  Abdomen:  Soft, nontender, bowel sounds present  Extremities: Trace peripheral edema.  Neurologic: Awake, alert, following commands  Skin: No lesions  Access: None    Basic Metabolic Panel: Recent Labs  Lab 05/11/19 0505 05/11/19 0505 05/12/19 0501 05/12/19 0501 05/13/19 0408 05/14/19 0450 05/15/19 0456  NA 138  --  133*  --  138 139 135  K 5.4*  --  4.8  --  4.8 4.7 5.1  CL 106  --  103  --  106 103 98  CO2 23  --  22  --  26 26 26   GLUCOSE 96  --  96  --  84 100* 100*  BUN 62*  --  64*  --  70* 75* 80*  CREATININE 4.35*  --  4.22*  --  4.43* 4.45* 4.65*  CALCIUM 7.4*   < > 7.1*   < > 7.3* 7.8* 7.8*  MG  --   --   --   --  1.4*  --   --   PHOS  --   --   --   --   --   --  6.2*   < > = values in this interval not  displayed.    Liver Function Tests: No results for input(s): AST, ALT, ALKPHOS, BILITOT, PROT, ALBUMIN in the last 168 hours. Recent Labs  Lab 05/10/19 1455  LIPASE 21   No results for input(s): AMMONIA in the last 168 hours.  CBC: Recent Labs  Lab 05/10/19 1455 05/13/19 1114 05/14/19 1917 05/15/19 0456  WBC 5.3 5.2  --  4.9  HGB 7.6* 7.8* 9.1* 9.5*  HCT 23.8* 24.1* 27.6* 27.6*  MCV 100.4* 98.8  --  93.9  PLT 215 230  --  218    Cardiac Enzymes: No results for input(s): CKTOTAL, CKMB, CKMBINDEX, TROPONINI in the last 168 hours.  BNP: Invalid input(s): POCBNP  CBG: Recent Labs  Lab 05/14/19 1227 05/14/19 1635 05/14/19 2055 05/15/19 0812 05/15/19 1138  GLUCAP 154* 78 86 101* 127*    Microbiology: Results for orders placed or performed during the hospital encounter of 05/10/19  Respiratory Panel by RT PCR (Flu A&B, Covid) - Nasopharyngeal Swab     Status: None   Collection Time: 05/10/19  4:14 PM   Specimen: Nasopharyngeal Swab  Result Value Ref Range Status   SARS Coronavirus 2 by RT PCR NEGATIVE NEGATIVE Final    Comment: (NOTE) SARS-CoV-2 target nucleic acids are NOT DETECTED. The SARS-CoV-2 RNA is generally detectable in upper respiratoy specimens during the acute phase of infection. The lowest concentration of SARS-CoV-2 viral copies this assay can detect is 131 copies/mL. A negative result does not preclude SARS-Cov-2 infection and should not be used as the sole basis for treatment or other patient management decisions. A negative result may occur with  improper specimen collection/handling, submission of specimen other than nasopharyngeal swab, presence of viral mutation(s) within the areas targeted by this assay, and inadequate number of viral copies (<131 copies/mL). A negative result must be combined with clinical observations, patient history, and epidemiological information. The expected result is Negative. Fact Sheet for Patients:   PinkCheek.be Fact Sheet for Healthcare Providers:  GravelBags.it This test is not yet ap proved or cleared by the [place] FDA and  has been authorized for detection and/or diagnosis of SARS-CoV-2 by FDA under an Emergency Use Authorization (EUA). This EUA will remain  in effect (meaning this test can be used) for the duration of the COVID-19 declaration under Section 564(b)(1) of the Act, 21 U.S.C. section 360bbb-3(b)(1), unless the authorization is terminated or revoked sooner.    Influenza A by PCR NEGATIVE NEGATIVE Final   Influenza B by PCR NEGATIVE NEGATIVE Final    Comment: (NOTE) The Xpert Xpress SARS-CoV-2/FLU/RSV assay is intended as an aid in  the diagnosis of influenza from Nasopharyngeal swab specimens and  should not be used as a sole basis for treatment. Nasal washings and  aspirates are unacceptable for Xpert Xpress SARS-CoV-2/FLU/RSV  testing. Fact Sheet for Patients: PinkCheek.be Fact Sheet for Healthcare Providers: GravelBags.it This test is not yet approved or cleared by the [place] FDA and  has been authorized for detection and/or diagnosis of SARS-CoV-2 by  FDA under an Emergency Use Authorization (EUA). This EUA will remain  in effect (meaning this test can be used) for the duration of the  Covid-19 declaration under Section 564(b)(1) of the Act, 21  U.S.C. section 360bbb-3(b)(1), unless the authorization is  terminated or revoked. Performed at [facility], [address] [place], [place] [address]     Coagulation Studies: No results for input(s): LABPROT, INR in the last 72 hours.  Urinalysis: No results for input(s): COLORURINE, LABSPEC, PHURINE, GLUCOSEU, HGBUR, BILIRUBINUR, KETONESUR, PROTEINUR, UROBILINOGEN, NITRITE, LEUKOCYTESUR in the last 72 hours.  Invalid input(s): APPERANCEUR    Imaging: No results  found.   Medications:   . sodium chloride     . aspirin EC  81 mg Oral Daily  . carvedilol  6.25 mg Oral BID WC  . furosemide  40 mg Oral BID  . heparin  5,000 Units Subcutaneous Q8H  . hydrALAZINE  10 mg Oral TID  . insulin aspart  0-15 Units Subcutaneous TID WC  . insulin aspart  0-5 Units Subcutaneous QHS  . lactulose  10 g Oral TID  . pantoprazole  40 mg Oral Daily  . pravastatin  40 mg Oral q1800  . sodium chloride flush  3 mL Intravenous Q12H   sodium chloride, acetaminophen, guaiFENesin-dextromethorphan, ondansetron (ZOFRAN) IV, sodium chloride flush  Assessment/ Plan:  79 y.o. 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  1.  Chronic kidney disease stage V.  We had a long discussion with the patient again today.  Use of online interpreter was employed.  Patient did have a chance to discuss renal placement therapy with her daughter.  Again patient states that she does not want to start dialysis at this point in time.  Patient did appear to understand the implications of this.  For now continue supportive care.  2.  Anemia of chronic kidney disease.  Hemoglobin improved posttransfusion.  Continue to monitor CBC as an outpatient.  2.  Hypertension.  Maintain the patient on carvedilol and hydralazine.  4.  Secondary hyperparathyroidism.  Recommend continued monitoring of bone mineral metabolism parameters as an outpatient.   LOS: 5 Ahmira Boisselle 5/9/20214:02 PM

## 2019-05-16 LAB — GLUCOSE, CAPILLARY
Glucose-Capillary: 99 mg/dL (ref 70–99)
Glucose-Capillary: 138 mg/dL — ABNORMAL HIGH (ref 70–99)

## 2019-05-16 LAB — PARATHYROID HORMONE, INTACT (NO CA): PTH: 184 pg/mL — ABNORMAL HIGH (ref 15–65)

## 2019-05-16 MED ORDER — FUROSEMIDE 40 MG PO TABS: 40.0000 mg | ORAL_TABLET | Freq: Two times a day (BID) | ORAL | 0 refills | Status: DC

## 2019-05-16 MED ORDER — HYDRALAZINE HCL 10 MG PO TABS: 10.0000 mg | ORAL_TABLET | Freq: Three times a day (TID) | ORAL | 0 refills | Status: DC

## 2019-05-16 MED ORDER — CARVEDILOL 6.25 MG PO TABS: 6.2500 mg | ORAL_TABLET | Freq: Two times a day (BID) | ORAL | 0 refills | Status: DC

## 2019-05-16 MED ORDER — ASPIRIN 81 MG PO TBEC: 81.0000 mg | DELAYED_RELEASE_TABLET | Freq: Every day | ORAL | 0 refills | Status: AC

## 2019-05-16 MED ORDER — LACTULOSE 10 GM/15ML PO SOLN: 10.0000 g | Freq: Three times a day (TID) | ORAL | 0 refills | Status: DC

## 2019-05-16 NOTE — Evaluation (Signed)
Physical Therapy Evaluation Patient Details Name: Robin Rogers MRN: [id number] DOB: [birth year] Today's Date: 05/16/2019   History of Present Illness  Pt is a 79 y.o. 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 Has recieved a transfusion this admission.    Clinical Impression  Pt alert, oriented, translator utilized throughout session. The patient reported at home she does not need assistance for ADLs, ambulates with handheld assist due to visual impairments, no falls. Husband available 24/7 to assist as needed.  The patient demonstrated bed mobility independently, and transfers independently (handheld assist provided throughout mobility due to visual impairments only). The patient ambulated >346ft and utilized standard commode with supervision. The patient demonstrated baseline level of functioning, no further acute PT needs indicated. PT to sign off. Please reconsult PT if pt status changes or acute needs are identified.      Follow Up Recommendations No PT follow up    Equipment Recommendations  None recommended by PT    Recommendations for Other Services       Precautions / Restrictions Precautions Precautions: Fall Restrictions Weight Bearing Restrictions: No      Mobility  Bed Mobility Overal bed mobility: Independent                Transfers Overall transfer level: Independent Equipment used: 1 person hand held assist(due to visual impairments)                Ambulation/Gait Ambulation/Gait assistance: Independent(handheld assist provided for visual impairments)              Stairs            Wheelchair Mobility    Modified Rankin (Stroke Patients Only)       Balance Overall balance assessment: No apparent balance deficits (not  formally assessed)                                           Pertinent Vitals/Pain Pain Assessment: No/denies pain    Home Living Family/patient expects to be discharged to:: Private residence Living Arrangements: Spouse/significant other Available Help at Discharge: Family;Available 24 hours/day Type of Home: House Home Access: Level entry         Additional Comments: Pt uses handheld assist to mobilize due to blindness.    Prior Function           Comments: Pt uses handheld assist to mobilize due to blindness. Stated she does not need help with ADLs     Hand Dominance        Extremity/Trunk Assessment   Upper Extremity Assessment Upper Extremity Assessment: Overall WFL for tasks assessed    Lower Extremity Assessment Lower Extremity Assessment: Overall WFL for tasks assessed    Cervical / Trunk Assessment Cervical / Trunk Assessment: Normal  Communication   Communication: Interpreter utilized  Cognition Arousal/Alertness: Awake/alert Behavior During Therapy: WFL for tasks assessed/performed Overall Cognitive Status: Within Functional Limits for tasks assessed                                        General Comments      Exercises Other Exercises  Other Exercises: Pt able to utilize commode with hand over hand assistance for blindness, supervision for functional tasks.   Assessment/Plan    PT Assessment Patent does not need any further PT services  PT Problem List         PT Treatment Interventions      PT Goals (Current goals can be found in the Care Plan section)       Frequency     Barriers to discharge        Co-evaluation               AM-PAC PT "6 Clicks" Mobility  Outcome Measure Help needed turning from your back to your side while in a flat bed without using bedrails?: None Help needed moving from lying on your back to sitting on the side of a flat bed without using bedrails?: None Help  needed moving to and from a bed to a chair (including a wheelchair)?: None Help needed standing up from a chair using your arms (e.g., wheelchair or bedside chair)?: None Help needed to walk in hospital room?: None Help needed climbing 3-5 steps with a railing? : None 6 Click Score: 24    End of Session Equipment Utilized During Treatment: Gait belt Activity Tolerance: Patient tolerated treatment well Patient left: in bed;with family/visitor present;with bed alarm set;with call bell/phone within reach Nurse Communication: Mobility status PT Visit Diagnosis: Other abnormalities of gait and mobility (R26.89)    Time: 8756-4332 PT Time Calculation (min) (ACUTE ONLY): 23 min   Charges:   PT Evaluation $PT Eval Moderate Complexity: 1 Mod PT Treatments $Therapeutic Activity: 8-22 mins        Lieutenant Diego PT, DPT 11:54 AM,05/16/19

## 2019-05-16 NOTE — Care Management Important Message (Signed)
Important Message  Patient Details  Name: Robin Rogers MRN: [id number] Date of Birth: [birth year]   Medicare Important Message Given:  Yes     Dannette Barbara 05/16/2019, 2:13 PM

## 2019-05-16 NOTE — Discharge Summary (Signed)
Physician Discharge Summary  Robin Rogers JKK:[id number] DOB: [birth year] DOA: 05/11/2019  PCP: [facility] [place] date: 05/11/2019 Discharge date: 05/16/2019  Recommendations for Outpatient Follow-up:  Follow up with PCP in 7-10 days. Pt is to have chemistry drawn at PCP visit. Follow up with nephrology in 4 weeks. Follow up with cardiology as directed.  Discharge Diagnoses: Principal diagnosis is #1 1. Hypoxic respiratory failure 2. 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 AKI on CKD V 4. Morbid obesity 5. DM II 6. Hypertension  Discharge Condition: Fair  Disposition: Home  Diet recommendation: Carbohydrate controlled, heart healthy  There were no vitals filed for this visit.  History of present illness:  Robin Rogers is an 79 y.o. 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  Patient noted that she could not lay down flat because she was short of breath and felt improved when she sat back up.  Prior to this and during this time patient had no chest pain diaphoresis although she has been nauseated for the past 4 days.  Patient decided to come to the hospital today because she could not sleep at all last night because of inability to lay down due to shortness of breath.  Review of systems notable for nausea as noted above.  No vomiting.  She does admit to anorexia.  She is also noted new onset lower extremity edema for the past 3 days.  Patient denies any new medications other than what she is prescribed at the [facility] clinic.  Denies taking any NSAIDs.  Patient specifically denies any fevers or chills or cough or body aches or much malaise or myalgia.  ED Course:  The patient is  noted to be a little bit short of breath but no hypoxia on room air.  Chest x-ray showed some mild interstitial vascular congestion.  BNP was noted to be 700.  She was noted to be in acute kidney failure with creatinine of 4.3 up from 2.4.  Hospital Course:  The patient has been admitted to a telemetry bed. She is being diuresed. She is currently negative 3.45L in terms of volume status. Echocardiogram has been completed. It  demonstrates EF of 40-45 % with global hypokinesis of left ventricle and grade III diastolic dysfunction. Cardiology and Nephrology have been consulted.  Cardiology has recommended maintaining hemoglobin greater than 10. It is 7.8 today. The patient will be transfused with 2 units PRBC's.  The patient's daughter plans to discuss with her about possible progression to dialysis. As of the day of discharge, the patient is still considering the possible progression to HD.  Today's assessment: S: The patient is resting comfortably. No new complaints. O: Vitals:  Vitals:   05/16/19 0809 05/16/19 1244  BP: (!) 118/43 (!) 154/56  Pulse: 63 64  Resp: 17 17  Temp: 98.7 F (37.1 C) (!) 97.5 F (36.4 C)  SpO2: 100% 100%    Exam:  Constitutional:   The patient is awake, alert, and oriented x 3. No acute distress. Respiratory:   No increased work of breathing.  No wheezes or rhonchi  Positive for scattered rales bilaterally.  No tactile fremitus  Diminished breath sounds bilaterally. Cardiovascular:   Regular rate and rhythm  No murmurs, ectopy, or gallups.  No lateral PMI. No thrills. Abdomen:   Abdomen is soft, non-tender, non-distended  No hernias, masses, or organomegaly  Normoactive bowel sounds.  Musculoskeletal:   No cyanosis or clubbing  Positive for 2+ edema Lower extremity.bilaterally Skin:   No rashes, lesions, ulcers  palpation of skin: no induration or nodules Neurologic:   CN 2-12 intact  Sensation all 4 extremities  intact Psychiatric:   Mental status ? Mood, affect appropriate ? Orientation to person, place, time   judgment and insight appear intact  Discharge Instructions Allergies as of 05/16/2019   No Known Allergies     Medication List    STOP taking these medications   amLODipine 10 MG tablet Commonly known as: NORVASC   metoprolol tartrate 25 MG tablet Commonly known as: LOPRESSOR   polyethylene glycol 17 g packet Commonly known as: MIRALAX / GLYCOLAX     TAKE these medications   aspirin 81 MG EC tablet Take 1 tablet (81 mg total) by mouth daily.   carvedilol 6.25 MG tablet Commonly known as: COREG Take 1 tablet (6.25 mg total) by mouth 2 (two) times daily with a meal.   furosemide 40 MG tablet Commonly known as: LASIX Take 1 tablet (40 mg total) by mouth 2 (two) times daily.   hydrALAZINE 10 MG tablet Commonly known as: APRESOLINE Take 1 tablet (10 mg total) by mouth 3 (three) times daily.   lactulose 10 GM/15ML solution Commonly known as: CHRONULAC Take 15 mLs (10 g total) by mouth 3 (three) times daily.   lovastatin 40 MG tablet Commonly known as: MEVACOR Take 1 tablet by mouth at bedtime.   pantoprazole 40 MG tablet Commonly known as: PROTONIX Take 1 tablet (40 mg total) by mouth daily.      No Known Allergies  The results of significant diagnostics from this hospitalization (including imaging, microbiology, ancillary and laboratory) are listed below for reference.    Significant Diagnostic Studies: DG Chest Port 1 View  Result Date: 05/10/2019 CLINICAL DATA:  Shortness of breath. EXAM: PORTABLE CHEST 1 VIEW COMPARISON:  09/25/2017.  09/22/2017. FINDINGS: Severe cardiomegaly again noted. Mild pulmonary venous congestion. Mild interstitial prominence. Small bilateral pleural effusions. Mild CHF cannot be excluded. Bibasilar subsegmental atelectasis. Degenerative changes scoliosis thoracic spine. IMPRESSION: One cardiomegaly. Mild pulmonary venous congestion  and mild pulmonary interstitial prominence. Small bilateral pleural effusions. Mild CHF cannot be excluded. 2.  Bibasilar subsegmental atelectasis. Electronically Signed   By: Marcello Moores  Register   On: 05/10/2019 16:06   ECHOCARDIOGRAM COMPLETE  Result Date: 05/11/2019    ECHOCARDIOGRAM REPORT   Patient Name:   ALEXEE DELSANTO Date of Exam: 05/11/2019 Medical Rec #:  [id number]       Height:       62.0 in Accession #:    [id number]      Weight:       162.0 lb Date of Birth:  September 25, 1940       BSA:          1.748 m Patient Age:    63 years        BP:           155/57 mmHg Patient Gender: F               HR:           66 bpm. Exam Location:  [facility] Procedure: 2D Echo, Color Doppler and Cardiac Doppler Indications:     S17.79 CHF-Acute Systolic  History:  Patient has prior history of Echocardiogram examinations. Risk                  Factors:Hypertension and Diabetes.  Sonographer:     Charmayne Sheer RDCS (AE) Referring Phys:  [id number] [place] Diagnosing Phys: Kate Sable MD IMPRESSIONS  1. Left ventricular ejection fraction, by estimation, is 40 to 45%. The left ventricle has mild to moderately decreased function. The left ventricle demonstrates global hypokinesis. Left ventricular diastolic parameters are consistent with Grade III diastolic dysfunction (restrictive).  2. Right ventricular systolic function is normal. The right ventricular size is normal. There is moderately elevated pulmonary artery systolic pressure.  3. Left atrial size was moderately dilated.  4. The mitral valve is grossly normal. Mild mitral valve regurgitation.  5. The aortic valve is grossly normal. Aortic valve regurgitation is not visualized.  6. The inferior vena cava is dilated in size with <50% respiratory variability, suggesting right atrial pressure of 15 mmHg. FINDINGS  Left Ventricle: Left ventricular ejection fraction, by estimation, is 40 to 45%. The left ventricle has mild to moderately decreased function. The  left ventricle demonstrates global hypokinesis. The left ventricular internal cavity size was normal in size. There is no left ventricular hypertrophy. Left ventricular diastolic parameters are consistent with Grade III diastolic dysfunction (restrictive). Right Ventricle: The right ventricular size is normal. No increase in right ventricular wall thickness. Right ventricular systolic function is normal. There is moderately elevated pulmonary artery systolic pressure. The tricuspid regurgitant velocity is 3.16 m/s, and with an assumed right atrial pressure of 15 mmHg, the estimated right ventricular systolic pressure is 78.4 mmHg. Left Atrium: Left atrial size was moderately dilated. Right Atrium: Right atrial size was normal in size. Pericardium: A small pericardial effusion is present. The pericardial effusion is anterior to the right ventricle. The pericardial effusion appears to contain epicardial fat. Mitral Valve: The mitral valve is grossly normal. Mild mitral valve regurgitation. MV peak gradient, 8.9 mmHg. The mean mitral valve gradient is 3.0 mmHg. Tricuspid Valve: The tricuspid valve is normal in structure. Tricuspid valve regurgitation is not demonstrated. Aortic Valve: The aortic valve is grossly normal. Aortic valve regurgitation is not visualized. Aortic valve mean gradient measures 6.0 mmHg. Aortic valve peak gradient measures 9.6 mmHg. Aortic valve area, by VTI measures 1.60 cm. Pulmonic Valve: The pulmonic valve was normal in structure. Pulmonic valve regurgitation is trivial. Aorta: The aortic root is normal in size and structure. Venous: The inferior vena cava is dilated in size with less than 50% respiratory variability, suggesting right atrial pressure of 15 mmHg. IAS/Shunts: The interatrial septum appears to be lipomatous. No atrial level shunt detected by color flow Doppler.  LEFT VENTRICLE PLAX 2D LVIDd:         6.49 cm  Diastology LVIDs:         4.58 cm  LV e' lateral:   6.20 cm/s LV PW:          1.10 cm  LV E/e' lateral: 20.8 LV IVS:        0.80 cm  LV e' medial:    5.87 cm/s LVOT diam:     1.90 cm  LV E/e' medial:  22.0 LV SV:         50 LV SV Index:   28 LVOT Area:     2.84 cm  RIGHT VENTRICLE RV Basal diam:  3.60 cm LEFT ATRIUM              Index LA  diam:        5.10 cm  2.92 cm/m LA Vol (A2C):   113.0 ml 64.65 ml/m LA Vol (A4C):   80.9 ml  46.28 ml/m LA Biplane Vol: 96.8 ml  55.38 ml/m  AORTIC VALVE                    PULMONIC VALVE AV Area (Vmax):    1.41 cm     PV Vmax:       1.15 m/s AV Area (Vmean):   1.17 cm     PV Vmean:      80.900 cm/s AV Area (VTI):     1.60 cm     PV VTI:        0.267 m AV Vmax:           155.00 cm/s  PV Peak grad:  5.3 mmHg AV Vmean:          117.000 cm/s PV Mean grad:  3.0 mmHg AV VTI:            0.311 m AV Peak Grad:      9.6 mmHg AV Mean Grad:      6.0 mmHg LVOT Vmax:         76.90 cm/s LVOT Vmean:        48.300 cm/s LVOT VTI:          0.175 m LVOT/AV VTI ratio: 0.56  AORTA Ao Root diam: 3.00 cm MITRAL VALVE                TRICUSPID VALVE MV Area (PHT): 4.89 cm     TR Peak grad:   39.9 mmHg MV Peak grad:  8.9 mmHg     TR Vmax:        316.00 cm/s MV Mean grad:  3.0 mmHg MV Vmax:       1.49 m/s     SHUNTS MV Vmean:      85.0 cm/s    Systemic VTI:  0.18 m MV Decel Time: 155 msec     Systemic Diam: 1.90 cm MV E velocity: 129.00 cm/s MV A velocity: 56.80 cm/s MV E/A ratio:  2.27 Kate Sable MD Electronically signed by Kate Sable MD Signature Date/Time: 05/11/2019/4:45:12 PM    Final     Microbiology: Recent Results (from the past 240 hour(s))  Respiratory Panel by RT PCR (Flu A&B, Covid) - Nasopharyngeal Swab     Status: None   Collection Time: 05/10/19  4:14 PM   Specimen: Nasopharyngeal Swab  Result Value Ref Range Status   SARS Coronavirus 2 by RT PCR NEGATIVE NEGATIVE Final    Comment: (NOTE) SARS-CoV-2 target nucleic acids are NOT DETECTED. The SARS-CoV-2 RNA is generally detectable in upper respiratoy specimens during the acute phase  of infection. The lowest concentration of SARS-CoV-2 viral copies this assay can detect is 131 copies/mL. A negative result does not preclude SARS-Cov-2 infection and should not be used as the sole basis for treatment or other patient management decisions. A negative result may occur with  improper specimen collection/handling, submission of specimen other than nasopharyngeal swab, presence of viral mutation(s) within the areas targeted by this assay, and inadequate number of viral copies (<131 copies/mL). A negative result must be combined with clinical observations, patient history, and epidemiological information. The expected result is Negative. Fact Sheet for Patients:  PinkCheek.be Fact Sheet for Healthcare Providers:  GravelBags.it This test is not yet ap proved or cleared by the [place] FDA  and  has been authorized for detection and/or diagnosis of SARS-CoV-2 by FDA under an Emergency Use Authorization (EUA). This EUA will remain  in effect (meaning this test can be used) for the duration of the COVID-19 declaration under Section 564(b)(1) of the Act, 21 U.S.C. section 360bbb-3(b)(1), unless the authorization is terminated or revoked sooner.    Influenza A by PCR NEGATIVE NEGATIVE Final   Influenza B by PCR NEGATIVE NEGATIVE Final    Comment: (NOTE) The Xpert Xpress SARS-CoV-2/FLU/RSV assay is intended as an aid in  the diagnosis of influenza from Nasopharyngeal swab specimens and  should not be used as a sole basis for treatment. Nasal washings and  aspirates are unacceptable for Xpert Xpress SARS-CoV-2/FLU/RSV  testing. Fact Sheet for Patients: PinkCheek.be Fact Sheet for Healthcare Providers: GravelBags.it This test is not yet approved or cleared by the [place] FDA and  has been authorized for detection and/or diagnosis of SARS-CoV-2 by  FDA  under an Emergency Use Authorization (EUA). This EUA will remain  in effect (meaning this test can be used) for the duration of the  Covid-19 declaration under Section 564(b)(1) of the Act, 21  U.S.C. section 360bbb-3(b)(1), unless the authorization is  terminated or revoked. Performed at [facility], [address] [place], [place] [address]      Labs: Basic Metabolic Panel: Recent Labs  Lab 05/11/19 0505 05/12/19 0501 05/13/19 0408 05/14/19 0450 05/15/19 0456  NA 138 133* 138 139 135  K 5.4* 4.8 4.8 4.7 5.1  CL 106 103 106 103 98  CO2 23 22 26 26 26   GLUCOSE 96 96 84 100* 100*  BUN 62* 64* 70* 75* 80*  CREATININE 4.35* 4.22* 4.43* 4.45* 4.65*  CALCIUM 7.4* 7.1* 7.3* 7.8* 7.8*  MG  --   --  1.4*  --   --   PHOS  --   --   --   --  6.2*   Liver Function Tests: No results for input(s): AST, ALT, ALKPHOS, BILITOT, PROT, ALBUMIN in the last 168 hours. Recent Labs  Lab 05/10/19 1455  LIPASE 21   No results for input(s): AMMONIA in the last 168 hours. CBC: Recent Labs  Lab 05/10/19 1455 05/13/19 1114 05/14/19 1917 05/15/19 0456  WBC 5.3 5.2  --  4.9  HGB 7.6* 7.8* 9.1* 9.5*  HCT 23.8* 24.1* 27.6* 27.6*  MCV 100.4* 98.8  --  93.9  PLT 215 230  --  218   Cardiac Enzymes: No results for input(s): CKTOTAL, CKMB, CKMBINDEX, TROPONINI in the last 168 hours. BNP: BNP (last 3 results) Recent Labs    05/10/19 1605  BNP 702.0*    ProBNP (last 3 results) No results for input(s): PROBNP in the last 8760 hours.  CBG: Recent Labs  Lab 05/15/19 0812 05/15/19 1138 05/15/19 1630 05/16/19 0810 05/16/19 1140  GLUCAP 101* 127* 109* 99 138*   Time coordinating discharge: 38 minutes.  Signed:        Areil Ottey, DO [facility]  05/16/2019, 7:22 PM

## 2019-05-16 NOTE — Progress Notes (Signed)
Patient alert  And oriented,  Daughter at bedside.  D/c telemetry and piv.  No questions.  Discharge paperwork given and f/u at CHF clinic on Thursday.

## 2019-05-16 NOTE — Plan of Care (Signed)
  Problem: Clinical Measurements: Goal: Diagnostic test results will improve Outcome: Progressing Goal: Respiratory complications will improve Outcome: Progressing   Problem: Activity: Goal: Capacity to carry out activities will improve Outcome: Progressing

## 2019-05-16 NOTE — Progress Notes (Signed)
OT Cancellation Note  Patient Details Name: Robin Rogers MRN: [id number] DOB: 09-18-1940   Cancelled Treatment:    Reason Eval/Treat Not Completed: OT screened, no needs identified, will sign off. Per conversation c PT pt is Independent c mobility and at baseline for ADLs. Using translator OT discussed c pt/caregiver at bedside - pt reports being at baseline. OT assist c translator to order meal for pt. Will sign off, please reconsult if acute OT needs arise.   Dessie Coma, M.S. OTR/L  05/16/19, 1:55 PM

## 2019-05-17 ENCOUNTER — Telehealth: Payer: Self-pay | Admitting: Cardiovascular Disease

## 2019-05-17 NOTE — Telephone Encounter (Signed)
-----   Message from Buford Dresser, MD sent at 05/14/2019 12:16 PM EDT ----- Regarding: post hospital follow up This patient will need post hospital follow up within a few weeks. She was seen by Dr. Rockey Situ in the hospital. She needs an appt on a Monday as this is the day her daughter is off and can bring her to the office. Thanks.  Bridgette Harrell Gave

## 2019-05-17 NOTE — Telephone Encounter (Signed)
Attempted to schedule no ans no vm  

## 2019-05-18 LAB — PTH, INTACT AND CALCIUM
Calcium, Total (PTH): 7.8 mg/dL — ABNORMAL LOW (ref 8.7–10.3)
PTH: 200 pg/mL — ABNORMAL HIGH (ref 15–65)

## 2019-05-18 NOTE — Progress Notes (Signed)
Patient ID: Robin Rogers, female    DOB: Jul 14, 1940, 79 y.o.   MRN: [id number]  HPI  **entire visit was done with [facility] interpreter services**   Ms Koranda is a 79 y/o female with a history of DM, HTN, CKD, blindness and chronic heart failure.   Echo report from 5/521 reviewed and showed an EF of 40-45% along with moderately elevated PA pressure and mild MR. Echo report from 09/25/17 reviewed and showed an EF of 45-50% along with mild AR and moderate MR.   Admitted 05/10/19 due to acute on chronic HF. Nephrology and cardiology consults obtained. Initially given IV lasix with transition to oral diuretics with discussion of future dialysis. Discharged after 6 days.   She presents today for a follow-up visit although hasn't been seen since October 2019. She presents with a chief complaint of minimal fatigue upon moderate exertion. She describes this as chronic in nature having been present for several years. She has associated decreased appetite, weakness and nausea along with this. She denies any difficulty sleeping, dizziness, abdominal distention, palpitations, pedal edema, chest pain, shortness of breath, cough or weight gain.    Past Medical History:  Diagnosis Date  . Anemia of chronic disease   . Blindness   . Chronic combined systolic (congestive) and diastolic (congestive) heart failure ([place])    a. 09/2017 Echo: EF 45-50%, Gr2 DD; b. 05/2019 Echo: EF 40-45%, Gr III DD (restrictive), nl RV fxn, mod elev PASP. Mod dil LA. Mild MR.  . CKD (chronic kidney disease), stage IV ([place])   . Diet-controlled diabetes mellitus ([place])   . Hypertension    Past Surgical History:  Procedure Laterality Date  . CHOLECYSTECTOMY    . ESOPHAGOGASTRODUODENOSCOPY N/A 09/24/2017   Procedure: ESOPHAGOGASTRODUODENOSCOPY (EGD);  Surgeon: Toledo, Benay Pike, MD;  Location: [facility] ENDOSCOPY;  Service: Gastroenterology;  Laterality: N/A;   No family history on file. Social History   Tobacco Use  . Smoking  status: Never Smoker  . Smokeless tobacco: Never Used  Substance Use Topics  . Alcohol use: No   No Known Allergies  Prior to Admission medications   Medication Sig Start Date End Date Taking? Authorizing Provider  aspirin EC 81 MG EC tablet Take 1 tablet (81 mg total) by mouth daily. 05/16/19  Yes Swayze, Ava, DO  carvedilol (COREG) 6.25 MG tablet Take 1 tablet (6.25 mg total) by mouth 2 (two) times daily with a meal. 05/16/19  Yes Swayze, Ava, DO  ferrous sulfate 325 (65 FE) MG tablet Take 325 mg by mouth 2 (two) times daily with a meal.   Yes [provider]  furosemide (LASIX) 40 MG tablet Take 1 tablet (40 mg total) by mouth 2 (two) times daily. 05/16/19  Yes Swayze, Ava, DO  hydrALAZINE (APRESOLINE) 10 MG tablet Take 1 tablet (10 mg total) by mouth 3 (three) times daily. 05/16/19  Yes Swayze, Ava, DO  lovastatin (MEVACOR) 40 MG tablet Take 1 tablet by mouth at bedtime.  01/21/17  Yes [provider]  omeprazole (PRILOSEC) 40 MG capsule Take 40 mg by mouth daily.   Yes [provider]  Vitamin D, Ergocalciferol, (DRISDOL) 1.25 MG (50000 UNIT) CAPS capsule Take 50,000 Units by mouth daily.   Yes [provider]    Review of Systems  Constitutional: Positive for appetite change (decreased) and fatigue (with moderate exertion).  HENT: Negative for congestion, postnasal drip and sore throat.   Eyes: Positive for visual disturbance (blind).  Respiratory: Negative for chest tightness  and shortness of breath.   Cardiovascular: Negative for chest pain, palpitations and leg swelling.  Gastrointestinal: Positive for nausea (after eating). Negative for abdominal distention and abdominal pain.  Endocrine: Negative.   Genitourinary: Negative.   Musculoskeletal: Negative for back pain and neck pain.  Skin: Negative.   Allergic/Immunologic: Negative.   Neurological: Positive for weakness. Negative for dizziness and light-headedness.  Hematological: Negative for  adenopathy. Does not bruise/bleed easily.  Psychiatric/Behavioral: Negative for dysphoric mood and sleep disturbance (sleeping on 1 pillow). The patient is not nervous/anxious.    Vitals:   05/19/19 1337  BP: (!) 146/58  Pulse: 73  Resp: 18  SpO2: 100%  Weight: 154 lb 6 oz (70 kg)  Height: 5\' 4"  (1.626 m)   Wt Readings from Last 3 Encounters:  05/19/19 154 lb 6 oz (70 kg)  05/16/19 149 lb 6.4 oz (67.8 kg)  10/09/17 170 lb 2 oz (77.2 kg)   Lab Results  Component Value Date   CREATININE 4.65 (H) 05/15/2019   CREATININE 4.45 (H) 05/14/2019   CREATININE 4.43 (H) 05/13/2019     Physical Exam Vitals and nursing note reviewed.  Constitutional:      Appearance: She is well-developed.  HENT:     Head: Normocephalic and atraumatic.  Neck:     Vascular: No JVD.  Cardiovascular:     Rate and Rhythm: Normal rate and regular rhythm.  Pulmonary:     Effort: Pulmonary effort is normal. No respiratory distress.     Breath sounds: No wheezing or rales.  Abdominal:     General: There is no distension.     Palpations: Abdomen is soft.     Tenderness: There is no abdominal tenderness.  Musculoskeletal:        General: No tenderness.     Cervical back: Normal range of motion and neck supple.  Skin:    General: Skin is warm and dry.  Neurological:     Mental Status: She is alert and oriented to person, place, and time.  Psychiatric:        Behavior: Behavior normal.        Thought Content: Thought content normal.     Assessment & Plan:   1: Chronic heart failure with reduced ejection fraction- - NYHA class II - euvolemic today - weighing daily; reminded to call for an overnight weight gain of >2 pounds or a weekly weight gain of >5 pounds - she is not adding salt and son says they don't cook with salt - renal function does not allow for entresto, farxiga or spironolactone - BNP 05/10/19 was 702.0  2: HTN- - BP looks ok today - follows with PCP at [place] 05/15/19 reviewed and showed sodium 135, potassium 5.1, creatinine 4.65 and GFR 8  3: DM with CKD- - A1c 05/11/19 was 5.3% - nonfasting glucose in clinic today was 216 - saw nephrology Sydell Axon) 08/17/17   Medication bottles were reviewed.  Due to stability of her HF, will not make a return appointment for patient at this time. Advised her and her son that they needed to call [facility] cardiology to schedule an appointment and their telephone number was provided.

## 2019-05-19 ENCOUNTER — Ambulatory Visit: Payer: Medicare Other | Attending: Family | Admitting: Family

## 2019-05-19 ENCOUNTER — Encounter: Payer: Self-pay | Admitting: Family

## 2019-05-19 ENCOUNTER — Other Ambulatory Visit: Payer: Self-pay

## 2019-05-19 VITALS — BP 146/58 | HR 73 | Resp 18 | Ht 64.0 in | Wt 154.4 lb

## 2019-05-19 DIAGNOSIS — Z79899 Other long term (current) drug therapy: Secondary | ICD-10-CM | POA: Insufficient documentation

## 2019-05-19 DIAGNOSIS — I10 Essential (primary) hypertension: Secondary | ICD-10-CM

## 2019-05-19 DIAGNOSIS — R110 Nausea: Secondary | ICD-10-CM | POA: Diagnosis not present

## 2019-05-19 DIAGNOSIS — E1122 Type 2 diabetes mellitus with diabetic chronic kidney disease: Secondary | ICD-10-CM | POA: Insufficient documentation

## 2019-05-19 DIAGNOSIS — H547 Unspecified visual loss: Secondary | ICD-10-CM | POA: Diagnosis not present

## 2019-05-19 DIAGNOSIS — R5383 Other fatigue: Secondary | ICD-10-CM | POA: Diagnosis not present

## 2019-05-19 DIAGNOSIS — I5022 Chronic systolic (congestive) heart failure: Secondary | ICD-10-CM | POA: Diagnosis present

## 2019-05-19 DIAGNOSIS — R531 Weakness: Secondary | ICD-10-CM | POA: Diagnosis not present

## 2019-05-19 DIAGNOSIS — I129 Hypertensive chronic kidney disease with stage 1 through stage 4 chronic kidney disease, or unspecified chronic kidney disease: Secondary | ICD-10-CM | POA: Insufficient documentation

## 2019-05-19 DIAGNOSIS — Z7982 Long term (current) use of aspirin: Secondary | ICD-10-CM | POA: Diagnosis not present

## 2019-05-19 DIAGNOSIS — N184 Chronic kidney disease, stage 4 (severe): Secondary | ICD-10-CM | POA: Diagnosis not present

## 2019-05-19 DIAGNOSIS — N185 Chronic kidney disease, stage 5: Secondary | ICD-10-CM

## 2019-05-19 LAB — GLUCOSE, CAPILLARY: Glucose-Capillary: 216 mg/dL — ABNORMAL HIGH (ref 70–99)

## 2019-05-19 NOTE — Patient Instructions (Addendum)
Continue weighing daily and call for an overnight weight gain of > 2 pounds or a weekly weight gain of >5 pounds.   Call Dr. Donivan Scull office to schedule an appointment: [phone]

## 2019-05-30 ENCOUNTER — Inpatient Hospital Stay
Admission: EM | Admit: 2019-05-30 | Discharge: 2019-06-03 | DRG: 641 | Disposition: A | Discharge status: Home Health | Payer: Medicare Other | Attending: Hospitalist | Admitting: Hospitalist

## 2019-05-30 ENCOUNTER — Other Ambulatory Visit: Payer: Self-pay

## 2019-05-30 DIAGNOSIS — N184 Chronic kidney disease, stage 4 (severe): Secondary | ICD-10-CM | POA: Diagnosis present

## 2019-05-30 DIAGNOSIS — H543 Unqualified visual loss, both eyes: Secondary | ICD-10-CM | POA: Diagnosis present

## 2019-05-30 DIAGNOSIS — E785 Hyperlipidemia, unspecified: Secondary | ICD-10-CM | POA: Diagnosis present

## 2019-05-30 DIAGNOSIS — E878 Other disorders of electrolyte and fluid balance, not elsewhere classified: Secondary | ICD-10-CM | POA: Diagnosis present

## 2019-05-30 DIAGNOSIS — D631 Anemia in chronic kidney disease: Secondary | ICD-10-CM | POA: Diagnosis present

## 2019-05-30 DIAGNOSIS — Z20822 Contact with and (suspected) exposure to covid-19: Secondary | ICD-10-CM | POA: Diagnosis present

## 2019-05-30 DIAGNOSIS — E119 Type 2 diabetes mellitus without complications: Secondary | ICD-10-CM | POA: Diagnosis present

## 2019-05-30 DIAGNOSIS — E876 Hypokalemia: Secondary | ICD-10-CM | POA: Diagnosis present

## 2019-05-30 DIAGNOSIS — R531 Weakness: Secondary | ICD-10-CM | POA: Diagnosis present

## 2019-05-30 DIAGNOSIS — T502X5A Adverse effect of carbonic-anhydrase inhibitors, benzothiadiazides and other diuretics, initial encounter: Secondary | ICD-10-CM | POA: Diagnosis present

## 2019-05-30 DIAGNOSIS — Z79899 Other long term (current) drug therapy: Secondary | ICD-10-CM | POA: Diagnosis not present

## 2019-05-30 DIAGNOSIS — G4700 Insomnia, unspecified: Secondary | ICD-10-CM | POA: Diagnosis present

## 2019-05-30 DIAGNOSIS — N179 Acute kidney failure, unspecified: Secondary | ICD-10-CM | POA: Diagnosis present

## 2019-05-30 DIAGNOSIS — I132 Hypertensive heart and chronic kidney disease with heart failure and with stage 5 chronic kidney disease, or end stage renal disease: Secondary | ICD-10-CM | POA: Diagnosis present

## 2019-05-30 DIAGNOSIS — E1122 Type 2 diabetes mellitus with diabetic chronic kidney disease: Secondary | ICD-10-CM | POA: Diagnosis present

## 2019-05-30 DIAGNOSIS — I160 Hypertensive urgency: Secondary | ICD-10-CM | POA: Diagnosis present

## 2019-05-30 DIAGNOSIS — Z7982 Long term (current) use of aspirin: Secondary | ICD-10-CM | POA: Diagnosis not present

## 2019-05-30 DIAGNOSIS — N185 Chronic kidney disease, stage 5: Secondary | ICD-10-CM

## 2019-05-30 DIAGNOSIS — E861 Hypovolemia: Secondary | ICD-10-CM | POA: Diagnosis present

## 2019-05-30 DIAGNOSIS — E871 Hypo-osmolality and hyponatremia: Secondary | ICD-10-CM | POA: Diagnosis present

## 2019-05-30 DIAGNOSIS — I5042 Chronic combined systolic (congestive) and diastolic (congestive) heart failure: Secondary | ICD-10-CM | POA: Diagnosis present

## 2019-05-30 DIAGNOSIS — I10 Essential (primary) hypertension: Secondary | ICD-10-CM | POA: Diagnosis present

## 2019-05-30 LAB — CBC
HCT: 27.6 % — ABNORMAL LOW (ref 36.0–46.0)
Hemoglobin: 10.1 g/dL — ABNORMAL LOW (ref 12.0–15.0)
MCH: 32.6 pg (ref 26.0–34.0)
MCHC: 36.6 g/dL — ABNORMAL HIGH (ref 30.0–36.0)
MCV: 89 fL (ref 80.0–100.0)
Platelets: 260 10*3/uL (ref 150–400)
RBC: 3.1 MIL/uL — ABNORMAL LOW (ref 3.87–5.11)
RDW: 11.9 % (ref 11.5–15.5)
WBC: 6.6 10*3/uL (ref 4.0–10.5)
nRBC: 0 % (ref 0.0–0.2)

## 2019-05-30 LAB — BASIC METABOLIC PANEL
Anion gap: 14 (ref 5–15)
BUN: 76 mg/dL — ABNORMAL HIGH (ref 8–23)
CO2: 23 mmol/L (ref 22–32)
Calcium: 8 mg/dL — ABNORMAL LOW (ref 8.9–10.3)
Chloride: 77 mmol/L — ABNORMAL LOW (ref 98–111)
Creatinine, Ser: 4.69 mg/dL — ABNORMAL HIGH (ref 0.44–1.00)
GFR calc Af Amer: 10 mL/min — ABNORMAL LOW (ref >60)
GFR calc non Af Amer: 8 mL/min — ABNORMAL LOW (ref >60)
Glucose, Bld: 135 mg/dL — ABNORMAL HIGH (ref 70–99)
Potassium: 3.5 mmol/L (ref 3.5–5.1)
Sodium: 114 mmol/L — CL (ref 135–145)

## 2019-05-30 LAB — SARS CORONAVIRUS 2 BY RT PCR (HOSPITAL ORDER, PERFORMED IN ~~LOC~~ HOSPITAL LAB): SARS Coronavirus 2: NEGATIVE

## 2019-05-30 MED ORDER — SODIUM CHLORIDE 0.9 % IV BOLUS
1000.0000 mL | Freq: Once | INTRAVENOUS | Status: AC
  Administered 2019-05-30: 1000 mL via INTRAVENOUS

## 2019-05-30 NOTE — H&P (Signed)
[facility] NAME: Robin Rogers    MR#:  [id number]  DATE OF BIRTH:  [birth year]  DATE OF ADMISSION:  05/30/2019  PRIMARY CARE PHYSICIAN: [facility] [place]   REQUESTING/REFERRING PHYSICIAN: Nance Pear, MD CHIEF COMPLAINT:   Chief Complaint  Patient presents with  . Dizziness  The patient's history was obtained through her and her daughter who only speaks Spanish and therefore a medical interpreter was required.  HISTORY OF PRESENT ILLNESS:  Robin Rogers  is a 79 y.o. Hispanic female with a known history of blindness, type 2 diabetes mellitus CHF, hypertension, stage IV chronic kidney disease and anemia, presented to the emergency room with acute onset of generalized weakness for about a week with diminished appetite and insomnia.  She has had occasional vomiting with no diarrhea.  No bilious vomitus or hematemesis.  She denied any cough or wheezing or dyspnea.  No fever or chills.  She denies abdominal pain or dysuria, oliguria or hematuria or flank pain.  She did have occasional ear ache.  No rhinorrhea or nasal congestion or sore throat.  Upon presentation to the emergency room blood pressure was 175/108 with otherwise normal vital signs.  Labs revealed severe hyponatremia with a sodium of 114 and hypochloremia with chloride of 77, potassium of 3.5 with a BUN of 76 and creatinine of 4.69 comparable to previous levels earlier this month and CBC showed anemia with hemoglobin of 10.1 hematocrit 27.6 better than earlier this month.  COVID-19 PCR came back negative and EKG showed normal sinus rhythm with a rate of 66 with first-degree AV block and prolonged QT interval with QTC of 490 MS.   PAST MEDICAL HISTORY:   Past Medical History:  Diagnosis Date  . Anemia of chronic disease   . Blindness   . Chronic combined systolic (congestive) and diastolic (congestive) heart failure ([place])    a. 09/2017 Echo: EF 45-50%, Gr2 DD; b.  05/2019 Echo: EF 40-45%, Gr III DD (restrictive), nl RV fxn, mod elev PASP. Mod dil LA. Mild MR.  . CKD (chronic kidney disease), stage IV ([place])   . Diet-controlled diabetes mellitus ([place])   . Hypertension     PAST SURGICAL HISTORY:   Past Surgical History:  Procedure Laterality Date  . CHOLECYSTECTOMY    . ESOPHAGOGASTRODUODENOSCOPY N/A 09/24/2017   Procedure: ESOPHAGOGASTRODUODENOSCOPY (EGD);  Surgeon: Toledo, Benay Pike, MD;  Location: [facility] ENDOSCOPY;  Service: Gastroenterology;  Laterality: N/A;    SOCIAL HISTORY:   Social History   Tobacco Use  . Smoking status: Never Smoker  . Smokeless tobacco: Never Used  Substance Use Topics  . Alcohol use: No    FAMILY HISTORY:  Positive for diabetes mellitus in her brother.  DRUG ALLERGIES:  No Known Allergies  REVIEW OF SYSTEMS:   ROS As per history of present illness. All pertinent systems were reviewed above. Constitutional,  HEENT, cardiovascular, respiratory, GI, GU, musculoskeletal, neuro, psychiatric, endocrine,  integumentary and hematologic systems were reviewed and are otherwise  negative/unremarkable except for positive findings mentioned above in the HPI.   MEDICATIONS AT HOME:   Prior to Admission medications   Medication Sig Start Date End Date Taking? Authorizing Provider  aspirin EC 81 MG EC tablet Take 1 tablet (81 mg total) by mouth daily. 05/16/19   Swayze, Ava, DO  carvedilol (COREG) 6.25 MG tablet Take 1 tablet (6.25 mg total) by mouth 2 (two) times daily with a meal. 05/16/19   Swayze, Ava, DO  ferrous sulfate 325 (65 FE) MG tablet Take 325 mg by mouth 2 (two) times daily with a meal.    [provider]  furosemide (LASIX) 40 MG tablet Take 1 tablet (40 mg total) by mouth 2 (two) times daily. 05/16/19   Swayze, Ava, DO  hydrALAZINE (APRESOLINE) 10 MG tablet Take 1 tablet (10 mg total) by mouth 3 (three) times daily. 05/16/19   Swayze, Ava, DO  lovastatin (MEVACOR) 40 MG tablet Take 1 tablet by mouth  at bedtime.  01/21/17   [provider]  omeprazole (PRILOSEC) 40 MG capsule Take 40 mg by mouth daily.    [provider]  Vitamin D, Ergocalciferol, (DRISDOL) 1.25 MG (50000 UNIT) CAPS capsule Take 50,000 Units by mouth daily.    [provider]      VITAL SIGNS:  Blood pressure (!) 175/108, pulse 70, resp. rate 18, weight 69.9 kg, SpO2 98 %.  PHYSICAL EXAMINATION:  Physical Exam  GENERAL:  79 y.o.-year-old Hispanic female patient lying in the bed with no acute distress.  EYES: She is blind in both eyes with corneal opacities.  No scleral icterus. Extraocular muscles intact.  HEENT: Head atraumatic, normocephalic. Oropharynx and nasopharynx clear.  NECK:  Supple, no jugular venous distention. No thyroid enlargement, no tenderness.  LUNGS: Normal breath sounds bilaterally, no wheezing, rales,rhonchi or crepitation. No use of accessory muscles of respiration.  CARDIOVASCULAR: Regular rate and rhythm, S1, S2 normal. No murmurs, rubs, or gallops.  ABDOMEN: Soft, nondistended, nontender. Bowel sounds present. No organomegaly or mass.  EXTREMITIES: No pedal edema, cyanosis, or clubbing.  NEUROLOGIC: Cranial nerves II through XII are intact except for bilateral eye blindness. Muscle strength 5/5 in all extremities. Sensation intact. Gait not checked.  PSYCHIATRIC: The patient is alert and oriented x 3.  Normal affect and good eye contact. SKIN: No obvious rash, lesion, or ulcer.   LABORATORY PANEL:   CBC Recent Labs  Lab 05/30/19 2001  WBC 6.6  HGB 10.1*  HCT 27.6*  PLT 260   ------------------------------------------------------------------------------------------------------------------  Chemistries  Recent Labs  Lab 05/30/19 2001  NA 114*  K 3.5  CL 77*  CO2 23  GLUCOSE 135*  BUN 76*  CREATININE 4.69*  CALCIUM 8.0*   ------------------------------------------------------------------------------------------------------------------  Cardiac  Enzymes No results for input(s): TROPONINI in the last 168 hours. ------------------------------------------------------------------------------------------------------------------  RADIOLOGY:  No results found.    IMPRESSION AND PLAN:   1.  Severe symptomatic hyponatremia and hypokalemia with subsequent generalized weakness. -The patient will be admitted to a medical monitored bed. -We will place her on hydration with IV normal saline. -We will follow serial sodium levels to ensure slow gradual increase. -Physical therapy consult to be obtained.  2.  Hypertensive urgency. -The patient will be continued on hydralazine and Coreg. -We will add as needed IV labetalol.  3.  Stage IV-V chronic kidney disease. -The patient will be hydrated with IV normal saline and will follow BMPs.  4.  Anemia of chronic disease. -This has been fairly stable and actually better than previous levels. -We will continue her ferrous sulfate.  5.  Dyslipidemia. -Statin therapy will be resumed.  6.  DVT prophylaxis. -Subcutaneous Lovenox    All the records are reviewed and case discussed with ED provider. The plan of care was discussed in details with the patient (and family). I answered all questions. The patient agreed to proceed with the above mentioned plan. Further management will depend upon hospital course.   CODE STATUS: Full code  Status  is: Inpatient  Remains inpatient appropriate because:Persistent severe electrolyte disturbances, Ongoing diagnostic testing needed not appropriate for outpatient work up, Unsafe d/c plan, IV treatments appropriate due to intensity of illness or inability to take PO and Inpatient level of care appropriate due to severity of illness   Dispo: The patient is from: Home              Anticipated d/c is to: Home              Anticipated d/c date is: 3 days              Patient currently is not medically stable to d/c.   TOTAL TIME TAKING CARE OF THIS  PATIENT: 55 minutes.    Christel Mormon M.D on 05/30/2019 at 9:46 PM  [facility]   From 7 PM-7 AM, contact night-coverage [email or web address]  CC: Primary care physician; [facility] [place]   Note: This dictation was prepared with Diplomatic Services operational officer dictation along with smaller phrase technology. Any transcriptional errors that result from this process are unintentional.

## 2019-05-30 NOTE — ED Triage Notes (Signed)
Pt arrives to ED via POV from home and was told by [name] to go to the ED for "emergency dialysis". Pt is not currently a dialysis patient; family states they were told her "kidneys are really low". Pt denies any c/o CP or SHOB. Pt does report (+) nausea. Pt is A&O, in NAD; RR even, regular, and unlabored. Pt is also blind with family member present.

## 2019-05-30 NOTE — ED Provider Notes (Signed)
[facility] Emergency Department Provider Note   ____________________________________________   I have reviewed the triage vital signs and the nursing notes.   HISTORY  Chief Complaint Dizziness   History limited by: [place] utilized   HPI Robin Rogers is a 79 y.o. female who presents to the emergency department today accompanied by daughter because of concerns for some dizziness, decreased appetite as well as lack of sleep.  The symptoms have been present for the past week.  Symptoms have stayed roughly constant throughout the week.  Patient denies any associated abdominal pain. The patient does have known history of kidney disease.  Has recently seen a nephrologist and was told that she will need dialysis.  In terms of the sleeping difficulty apparently she has not slept well over the past week.  She was given prescription for sleeping aid which she has tried taking without any significant relief.  Patient did have a recent hospitalization for breathing difficulty.   Records reviewed. Per medical record review patient has a history of recent hospitalization for breathing difficulty, aki. Per discharge summary it appears she had heart failure.   Past Medical History:  Diagnosis Date  . Anemia of chronic disease   . Blindness   . Chronic combined systolic (congestive) and diastolic (congestive) heart failure ([place])    a. 09/2017 Echo: EF 45-50%, Gr2 DD; b. 05/2019 Echo: EF 40-45%, Gr III DD (restrictive), nl RV fxn, mod elev PASP. Mod dil LA. Mild MR.  . CKD (chronic kidney disease), stage IV ([place])   . Diet-controlled diabetes mellitus ([place])   . Hypertension     Patient Active Problem List   Diagnosis Date Noted  . ARF (acute renal failure) ([place]) 05/10/2019  . 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 Chronic systolic heart failure ([place]) 10/09/2017  . HTN  (hypertension) 10/09/2017  . DM (diabetes mellitus) ([place]) 10/09/2017  . Intractable nausea and vomiting 09/22/2017    Past Surgical History:  Procedure Laterality Date  . CHOLECYSTECTOMY    . ESOPHAGOGASTRODUODENOSCOPY N/A 09/24/2017   Procedure: ESOPHAGOGASTRODUODENOSCOPY (EGD);  Surgeon: Toledo, Benay Pike, MD;  Location: [facility] ENDOSCOPY;  Service: Gastroenterology;  Laterality: N/A;    Prior to Admission medications   Medication Sig Start Date End Date Taking? Authorizing Provider  aspirin EC 81 MG EC tablet Take 1 tablet (81 mg total) by mouth daily. 05/16/19   Swayze, Ava, DO  carvedilol (COREG) 6.25 MG tablet Take 1 tablet (6.25 mg total) by mouth 2 (two) times daily with a meal. 05/16/19   Swayze, Ava, DO  ferrous sulfate 325 (65 FE) MG tablet Take 325 mg by mouth 2 (two) times daily with a meal.    [provider]  furosemide (LASIX) 40 MG tablet Take 1 tablet (40 mg total) by mouth 2 (two) times daily. 05/16/19   Swayze, Ava, DO  hydrALAZINE (APRESOLINE) 10 MG tablet Take 1 tablet (10 mg total) by mouth 3 (three) times daily. 05/16/19   Swayze, Ava, DO  lovastatin (MEVACOR) 40 MG tablet Take 1 tablet by mouth at bedtime.  01/21/17   [provider]  omeprazole (PRILOSEC) 40 MG capsule Take 40 mg by mouth daily.    [provider]  Vitamin D, Ergocalciferol, (DRISDOL) 1.25 MG (50000 UNIT) CAPS capsule Take 50,000 Units by mouth daily.    [provider]    Allergies Patient has no known allergies.  No family history  on file.  Social History Social History   Tobacco Use  . Smoking status: Never Smoker  . Smokeless tobacco: Never Used  Substance Use Topics  . Alcohol use: No  . Drug use: Never    Review of Systems Constitutional: No fever. Positive for weakness. Eyes: No visual changes. ENT: No sore throat. Cardiovascular: Denies chest pain. Respiratory: Denies shortness of breath. Gastrointestinal: No abdominal pain.  Positive for  decreased appetite.   Genitourinary: Negative for dysuria. Musculoskeletal: Negative for back pain. Skin: Negative for rash. Neurological: Negative for headaches, focal weakness or numbness.  ____________________________________________   PHYSICAL EXAM:  VITAL SIGNS: ED Triage Vitals [05/30/19 1945]  Enc Vitals Group     BP      Pulse      Resp      Temp      Temp src      SpO2      Weight 154 lb (69.9 kg)     Height      Head Circumference      Peak Flow      Pain Score 0   Constitutional: Alert and oriented.  Eyes: Conjunctivae are normal.  ENT      Head: Normocephalic and atraumatic.      Nose: No congestion/rhinnorhea.      Mouth/Throat: Mucous membranes are moist.      Neck: No stridor. Hematological/Lymphatic/Immunilogical: No cervical lymphadenopathy. Cardiovascular: Normal rate, regular rhythm.  No murmurs, rubs, or gallops. Respiratory: Normal respiratory effort without tachypnea nor retractions. Breath sounds are clear and equal bilaterally. No wheezes/rales/rhonchi. Gastrointestinal: Soft and non tender. No rebound. No guarding.  Genitourinary: Deferred Musculoskeletal: Normal range of motion in all extremities. No lower extremity edema. Neurologic:  Normal speech and language. Blind. No gross focal neurologic deficits are appreciated.  Skin:  Skin is warm, dry and intact. No rash noted. Psychiatric: Mood and affect are normal. Speech and behavior are normal. Patient exhibits appropriate insight and judgment.  ____________________________________________    LABS (pertinent positives/negatives)  BMP na 114, k 3.5, gl 77, glu 135, cr 4.69 CBC wbc 6.6, hgb 10.1, plt 260  ____________________________________________   EKG  I, Nance Pear, attending physician, personally viewed and interpreted this EKG  EKG Time: 1946 Rate: 66 Rhythm: sinus rhythm with 1st degree av block Axis: normal Intervals: qtc 490 QRS: narrow ST changes: no st  elevation Impression: abnormal ekg  ____________________________________________    RADIOLOGY  None  ____________________________________________   PROCEDURES  Procedures  ____________________________________________   INITIAL IMPRESSION / ASSESSMENT AND PLAN / ED COURSE  Pertinent labs & imaging results that were available during my care of the patient were reviewed by me and considered in my medical decision making (see chart for details).   Patient presented to the emergency department today because of concerns for some dizziness, weakness decreased appetite and difficulty with sleep over the past week.  Patient's blood work here is notable for significant hyponatremia.  She also has elevated creatinine although per chart review she has known kidney failure and is following up with nephrology.  Do have some concern that her hyponatremia could be related both to medications and decreased oral intake.  I did discuss importance of repletion with patient and family.  Will start IV fluids here in the emergency department. Will admit.    ____________________________________________   FINAL CLINICAL IMPRESSION(S) / ED DIAGNOSES  Final diagnoses:  Hyponatremia     Note: This dictation was prepared with Dragon dictation. Any transcriptional errors that  result from this process are unintentional     Nance Pear, MD 05/30/19 2130

## 2019-05-30 NOTE — ED Notes (Signed)
purwick placed on pt at this time 

## 2019-05-31 ENCOUNTER — Other Ambulatory Visit: Payer: Self-pay

## 2019-05-31 DIAGNOSIS — H543 Unqualified visual loss, both eyes: Secondary | ICD-10-CM | POA: Diagnosis present

## 2019-05-31 DIAGNOSIS — D631 Anemia in chronic kidney disease: Secondary | ICD-10-CM

## 2019-05-31 DIAGNOSIS — I160 Hypertensive urgency: Secondary | ICD-10-CM | POA: Diagnosis present

## 2019-05-31 DIAGNOSIS — R531 Weakness: Secondary | ICD-10-CM

## 2019-05-31 DIAGNOSIS — N189 Chronic kidney disease, unspecified: Secondary | ICD-10-CM | POA: Diagnosis present

## 2019-05-31 DIAGNOSIS — G4700 Insomnia, unspecified: Secondary | ICD-10-CM | POA: Diagnosis present

## 2019-05-31 DIAGNOSIS — I5042 Chronic combined systolic (congestive) and diastolic (congestive) heart failure: Secondary | ICD-10-CM

## 2019-05-31 DIAGNOSIS — N184 Chronic kidney disease, stage 4 (severe): Secondary | ICD-10-CM | POA: Diagnosis present

## 2019-05-31 LAB — BASIC METABOLIC PANEL
Anion gap: 13 (ref 5–15)
Anion gap: 11 (ref 5–15)
Anion gap: 10 (ref 5–15)
BUN: 68 mg/dL — ABNORMAL HIGH (ref 8–23)
BUN: 69 mg/dL — ABNORMAL HIGH (ref 8–23)
BUN: 64 mg/dL — ABNORMAL HIGH (ref 8–23)
CO2: 23 mmol/L (ref 22–32)
CO2: 22 mmol/L (ref 22–32)
CO2: 22 mmol/L (ref 22–32)
Calcium: 8 mg/dL — ABNORMAL LOW (ref 8.9–10.3)
Calcium: 7.7 mg/dL — ABNORMAL LOW (ref 8.9–10.3)
Calcium: 7.7 mg/dL — ABNORMAL LOW (ref 8.9–10.3)
Chloride: 84 mmol/L — ABNORMAL LOW (ref 98–111)
Chloride: 86 mmol/L — ABNORMAL LOW (ref 98–111)
Chloride: 89 mmol/L — ABNORMAL LOW (ref 98–111)
Creatinine, Ser: 4.29 mg/dL — ABNORMAL HIGH (ref 0.44–1.00)
Creatinine, Ser: 4.19 mg/dL — ABNORMAL HIGH (ref 0.44–1.00)
Creatinine, Ser: 4.14 mg/dL — ABNORMAL HIGH (ref 0.44–1.00)
GFR calc Af Amer: 11 mL/min — ABNORMAL LOW (ref >60)
GFR calc Af Amer: 11 mL/min — ABNORMAL LOW (ref >60)
GFR calc Af Amer: 11 mL/min — ABNORMAL LOW (ref >60)
GFR calc non Af Amer: 9 mL/min — ABNORMAL LOW (ref >60)
GFR calc non Af Amer: 10 mL/min — ABNORMAL LOW (ref >60)
GFR calc non Af Amer: 10 mL/min — ABNORMAL LOW (ref >60)
Glucose, Bld: 97 mg/dL (ref 70–99)
Glucose, Bld: 141 mg/dL — ABNORMAL HIGH (ref 70–99)
Glucose, Bld: 87 mg/dL (ref 70–99)
Potassium: 3.1 mmol/L — ABNORMAL LOW (ref 3.5–5.1)
Potassium: 3.9 mmol/L (ref 3.5–5.1)
Potassium: 4.3 mmol/L (ref 3.5–5.1)
Sodium: 120 mmol/L — ABNORMAL LOW (ref 135–145)
Sodium: 119 mmol/L — CL (ref 135–145)
Sodium: 121 mmol/L — ABNORMAL LOW (ref 135–145)

## 2019-05-31 LAB — URIC ACID: Uric Acid, Serum: 10.8 mg/dL — ABNORMAL HIGH (ref 2.5–7.1)

## 2019-05-31 LAB — GLUCOSE, CAPILLARY
Glucose-Capillary: 101 mg/dL — ABNORMAL HIGH (ref 70–99)
Glucose-Capillary: 107 mg/dL — ABNORMAL HIGH (ref 70–99)
Glucose-Capillary: 87 mg/dL (ref 70–99)
Glucose-Capillary: 98 mg/dL (ref 70–99)

## 2019-05-31 LAB — URINALYSIS, COMPLETE (UACMP) WITH MICROSCOPIC
Bacteria, UA: NONE SEEN
Bilirubin Urine: NEGATIVE
Glucose, UA: NEGATIVE mg/dL
Hgb urine dipstick: NEGATIVE
Ketones, ur: NEGATIVE mg/dL
Leukocytes,Ua: NEGATIVE
Nitrite: NEGATIVE
Protein, ur: 100 mg/dL — AB
Specific Gravity, Urine: 1.005 (ref 1.005–1.030)
pH: 6 (ref 5.0–8.0)

## 2019-05-31 LAB — NA AND K (SODIUM & POTASSIUM), RAND UR
Potassium Urine: 8 mmol/L
Sodium, Ur: 37 mmol/L

## 2019-05-31 LAB — TSH: TSH: 0.781 u[IU]/mL (ref 0.350–4.500)

## 2019-05-31 LAB — CORTISOL: Cortisol, Plasma: 22.6 ug/dL

## 2019-05-31 LAB — OSMOLALITY, URINE: Osmolality, Ur: 176 mOsm/kg — ABNORMAL LOW (ref 300–900)

## 2019-05-31 LAB — CHLORIDE, URINE, RANDOM: Chloride Urine: 31 mmol/L

## 2019-05-31 LAB — OSMOLALITY: Osmolality: 268 mOsm/kg — ABNORMAL LOW (ref 275–295)

## 2019-05-31 MED ORDER — POTASSIUM CHLORIDE 20 MEQ PO PACK
40.0000 meq | PACK | Freq: Once | ORAL | Status: AC
  Administered 2019-05-31: 40 meq via ORAL
  Filled 2019-05-31: qty 2

## 2019-05-31 MED ORDER — LABETALOL HCL 5 MG/ML IV SOLN: 20.0000 mg | INTRAVENOUS | Status: DC | PRN

## 2019-05-31 MED ORDER — SODIUM CHLORIDE 0.9 % IV SOLN: INTRAVENOUS | Status: AC

## 2019-05-31 MED ORDER — ACETAMINOPHEN 325 MG PO TABS
650.0000 mg | ORAL_TABLET | Freq: Four times a day (QID) | ORAL | Status: DC | PRN
  Administered 2019-05-31 – 2019-06-01 (×3): 650 mg via ORAL
  Filled 2019-05-31 (×3): qty 2

## 2019-05-31 MED ORDER — INSULIN ASPART 100 UNIT/ML ~~LOC~~ SOLN: 0.0000 [IU] | Freq: Three times a day (TID) | SUBCUTANEOUS | Status: DC

## 2019-05-31 MED ORDER — PRAVASTATIN SODIUM 20 MG PO TABS
20.0000 mg | ORAL_TABLET | Freq: Every day | ORAL | Status: DC
  Administered 2019-05-31 – 2019-06-02 (×3): 20 mg via ORAL
  Filled 2019-05-31 (×3): qty 1

## 2019-05-31 MED ORDER — ONDANSETRON HCL 4 MG/2ML IJ SOLN: 4.0000 mg | Freq: Four times a day (QID) | INTRAMUSCULAR | Status: DC | PRN

## 2019-05-31 MED ORDER — ENOXAPARIN SODIUM 40 MG/0.4ML ~~LOC~~ SOLN: 40.0000 mg | SUBCUTANEOUS | Status: DC

## 2019-05-31 MED ORDER — HEPARIN SODIUM (PORCINE) 5000 UNIT/ML IJ SOLN
5000.0000 [IU] | Freq: Two times a day (BID) | INTRAMUSCULAR | Status: DC
  Administered 2019-05-31 – 2019-06-03 (×7): 5000 [IU] via SUBCUTANEOUS
  Filled 2019-05-31 (×7): qty 1

## 2019-05-31 MED ORDER — ACETAMINOPHEN 650 MG RE SUPP: 650.0000 mg | Freq: Four times a day (QID) | RECTAL | Status: DC | PRN

## 2019-05-31 MED ORDER — PANTOPRAZOLE SODIUM 40 MG PO TBEC
40.0000 mg | DELAYED_RELEASE_TABLET | Freq: Every day | ORAL | Status: DC
  Administered 2019-05-31 – 2019-06-03 (×4): 40 mg via ORAL
  Filled 2019-05-31 (×4): qty 1

## 2019-05-31 MED ORDER — TRAZODONE HCL 50 MG PO TABS: 25.0000 mg | ORAL_TABLET | Freq: Every evening | ORAL | Status: DC | PRN

## 2019-05-31 MED ORDER — ONDANSETRON HCL 4 MG PO TABS: 4.0000 mg | ORAL_TABLET | Freq: Four times a day (QID) | ORAL | Status: DC | PRN

## 2019-05-31 MED ORDER — TRAZODONE HCL 50 MG PO TABS: 50.0000 mg | ORAL_TABLET | Freq: Every day | ORAL | Status: DC

## 2019-05-31 MED ORDER — MELATONIN 5 MG PO TABS
2.5000 mg | ORAL_TABLET | Freq: Every day | ORAL | Status: DC
  Administered 2019-05-31 – 2019-06-02 (×3): 2.5 mg via ORAL
  Filled 2019-05-31 (×3): qty 1

## 2019-05-31 MED ORDER — MAGNESIUM HYDROXIDE 400 MG/5ML PO SUSP: 30.0000 mL | Freq: Every day | ORAL | Status: DC | PRN

## 2019-05-31 MED ORDER — ASPIRIN EC 81 MG PO TBEC
81.0000 mg | DELAYED_RELEASE_TABLET | Freq: Every day | ORAL | Status: DC
  Administered 2019-05-31 – 2019-06-03 (×4): 81 mg via ORAL
  Filled 2019-05-31 (×4): qty 1

## 2019-05-31 MED ORDER — VITAMIN D (ERGOCALCIFEROL) 1.25 MG (50000 UNIT) PO CAPS: 50000.0000 [IU] | ORAL_CAPSULE | ORAL | Status: DC

## 2019-05-31 MED ORDER — TRAZODONE HCL 50 MG PO TABS
50.0000 mg | ORAL_TABLET | Freq: Every day | ORAL | Status: DC
  Administered 2019-05-31 – 2019-06-02 (×3): 50 mg via ORAL
  Filled 2019-05-31 (×3): qty 1

## 2019-05-31 NOTE — Hospital Course (Signed)
Robin Rogers  is a 79 y.o. Hispanic female with a medical history of blindness, type 2 diabetes mellitus CHF, hypertension, stage IV chronic kidney disease and anemia, presented to the ED on 05/30/19 with complaints of of generalized weakness, poor appetite, insomnia and occasional nausea vomiting for about a week.  In the ED, hypertensive 175/108, otherwise normal vitals.  Labs notable for severe hyponatremia (Na 114), hypochloremia (Cl 77), potassium 3.5, renal function stable from earlier this month, chronic stable anemia.  ECG showed 1st degree AV block with prolonged QTc of 490 ms.  Admitted to hospitalist service for further evaluation and management.   Upon chart review, patient was admitted for acute CHF decompensation earlier this month, discharged on 05/16/19 on oral Lasix 40 mg BID.  It appears she was new to taking diuretics at that time.  Sodium was normal at 135 prior to discharge.

## 2019-05-31 NOTE — Progress Notes (Signed)
[facility]  ROUNDING NOTE   Subjective:   Conversation via [place] interpreter Patient was sent to the emergency room from [name] for evaluation of dialysis She was found to have severe hyponatremia She is now being admitted for further evaluation Patient does report decreased appetite but her biggest problem is lack of sleep Sodium upon admission was 114 which has improved to 120 this morning Potassium was low at 3.1   Objective:  Vital signs in last 24 hours:  Temp:  [97.6 F (36.4 C)-98.4 F (36.9 C)] 97.6 F (36.4 C) (05/25 1159) Pulse Rate:  [59-73] 62 (05/25 1159) Resp:  [16-20] 16 (05/25 1159) BP: (139-175)/(41-108) 148/45 (05/25 1159) SpO2:  [98 %-100 %] 99 % (05/25 1159) Weight:  [69.9 kg] 69.9 kg (05/24 1945)  Weight change:  Filed Weights   05/30/19 1945  Weight: 69.9 kg    Intake/Output: I/O last 3 completed shifts: In: 1000 [IV Piggyback:1000] Out: 700 [Urine:700]   Intake/Output this shift:  Total I/O In: 494.2 [P.O.:480; I.V.:14.2] Out: -   Physical Exam: General: No acute distress  Head: Normocephalic, atraumatic. Moist oral mucosal membranes  Eyes: Anicteric  Neck: Supple, trachea midline  Lungs:  Clear to auscultation, normal effort  Heart: S1S2 no rubs  Abdomen:  Soft, nontender, bowel sounds present  Extremities: Trace peripheral edema.  Neurologic: Awake, alert, following commands  Skin: No lesions  Access: None    Basic Metabolic Panel: Recent Labs  Lab 05/30/19 2001 05/31/19 0802 05/31/19 1412  NA 114* 120* 119*  K 3.5 3.1* 3.9  CL 77* 84* 86*  CO2 23 23 22   GLUCOSE 135* 97 141*  BUN 76* 68* 69*  CREATININE 4.69* 4.29* 4.19*  CALCIUM 8.0* 8.0* 7.7*    Liver Function Tests: No results for input(s): AST, ALT, ALKPHOS, BILITOT, PROT, ALBUMIN in the last 168 hours. No results for input(s): LIPASE, AMYLASE in the last 168 hours. No results for input(s): AMMONIA in the last 168 hours.  CBC: Recent Labs   Lab 05/30/19 2001  WBC 6.6  HGB 10.1*  HCT 27.6*  MCV 89.0  PLT 260    Cardiac Enzymes: No results for input(s): CKTOTAL, CKMB, CKMBINDEX, TROPONINI in the last 168 hours.  BNP: Invalid input(s): POCBNP  CBG: Recent Labs  Lab 05/31/19 0811 05/31/19 1200 05/31/19 1637  GLUCAP 101* 107* 69    Microbiology: Results for orders placed or performed during the hospital encounter of 05/30/19  SARS Coronavirus 2 by RT PCR (hospital order, performed in [facility] hospital lab) Nasopharyngeal Nasopharyngeal Swab     Status: None   Collection Time: 05/30/19 10:27 PM   Specimen: Nasopharyngeal Swab  Result Value Ref Range Status   SARS Coronavirus 2 NEGATIVE NEGATIVE Final    Comment: (NOTE) SARS-CoV-2 target nucleic acids are NOT DETECTED. The SARS-CoV-2 RNA is generally detectable in upper and lower respiratory specimens during the acute phase of infection. The lowest concentration of SARS-CoV-2 viral copies this assay can detect is 250 copies / mL. A negative result does not preclude SARS-CoV-2 infection and should not be used as the sole basis for treatment or other patient management decisions.  A negative result may occur with improper specimen collection / handling, submission of specimen other than nasopharyngeal swab, presence of viral mutation(s) within the areas targeted by this assay, and inadequate number of viral copies (<250 copies / mL). A negative result must be combined with clinical observations, patient history, and epidemiological information. Fact Sheet for Patients:  StrictlyIdeas.no Fact Sheet for Healthcare Providers: BankingDealers.co.za This test is not yet approved or cleared  by the [place] FDA and has been authorized for detection and/or diagnosis of SARS-CoV-2 by FDA under an Emergency Use Authorization (EUA).  This EUA will remain in effect (meaning this test can be used) for the duration of  the COVID-19 declaration under Section 564(b)(1) of the Act, 21 U.S.C. section 360bbb-3(b)(1), unless the authorization is terminated or revoked sooner. Performed at [facility], [address] [place], [place] [address]     Coagulation Studies: No results for input(s): LABPROT, INR in the last 72 hours.  Urinalysis: Recent Labs    05/31/19 0802  COLORURINE STRAW*  LABSPEC 1.005  PHURINE 6.0  GLUCOSEU NEGATIVE  HGBUR NEGATIVE  BILIRUBINUR NEGATIVE  KETONESUR NEGATIVE  PROTEINUR 100*  NITRITE NEGATIVE  LEUKOCYTESUR NEGATIVE      Imaging: No results found.   Medications:   . sodium chloride 75 mL/hr at 05/31/19 1545   . aspirin EC  81 mg Oral Daily  . heparin injection (subcutaneous)  5,000 Units Subcutaneous Q12H  . insulin aspart  0-9 Units Subcutaneous TID PC & HS  . melatonin  2.5 mg Oral QHS  . pantoprazole  40 mg Oral Daily  . pravastatin  20 mg Oral q1800  . traZODone  50 mg Oral QHS  . [START ON 06/04/2019] Vitamin D (Ergocalciferol)  50,000 Units Oral Once per day on Sat   acetaminophen **OR** acetaminophen, labetalol, magnesium hydroxide, ondansetron **OR** ondansetron (ZOFRAN) IV  Assessment/ Plan:  79 y.o. 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  #Severe hyponatremia And hypokalemia Likely secondary to aggressive diuresis Home dose of Lasix 40 mg twice a day-now on hold Continue normal diet and await for sodium to improve slowly  #Chronic kidney disease stage V.  During previous admission, nephrology team had a long discussion with the patient about renal replacement therapy.  Patient and her daughter previously decided they did not want to start dialysis. Concept of renal replacement therapy was presented to the patient again.   She has stated that she will do it if absolutely necessary.  However, we would still discuss this with family during the stay. Electrolytes and volume status are acceptable.  No acute indication for dialysis today.  #Hyperuricemia Can consider starting low-dose allopurinol 50 mg every other day    LOS: 1 Ripley Bogosian 5/25/20215:13 PM

## 2019-05-31 NOTE — Progress Notes (Signed)
PROGRESS NOTE    Robin Rogers   WIO:[id number]  DOB: 1940-07-20  PCP: [facility]    DOA: 05/30/2019 LOS: 1   Brief Narrative   Robin Rogers  is a 79 y.o. Hispanic female with a medical history of blindness, type 2 diabetes mellitus CHF, hypertension, stage IV chronic kidney disease and anemia, presented to the ED on 05/30/19 with complaints of of generalized weakness, poor appetite, insomnia and occasional nausea vomiting for about a week.  In the ED, hypertensive 175/108, otherwise normal vitals.  Labs notable for severe hyponatremia (Na 114), hypochloremia (Cl 77), potassium 3.5, renal function stable from earlier this month, chronic stable anemia.  ECG showed 1st degree AV block with prolonged QTc of 490 ms.  Admitted to hospitalist service for further evaluation and management.   Upon chart review, patient was admitted for acute CHF decompensation earlier this month, discharged on 05/16/19 on oral Lasix 40 mg BID.  It appears she was new to taking diuretics at that time.  Sodium was normal at 135 prior to discharge.      Assessment & Plan   Principal Problem:   Hyponatremia Active Problems:   Generalized weakness   Chronic combined systolic and diastolic CHF (congestive heart failure) (HCC)   HTN (hypertension)   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 (HCC)   CKD (chronic kidney disease), stage IV (HCC)   Anemia due to chronic kidney disease   Insomnia   Blind in both eyes   Hyponatremia - present on admission, Na 114.  Acute to subacute.  Suspect multifactorial with diuretics that were started recently for CHF, renal failure contributing.  She appears dry to euvolemic.  Hypotonic, sOsm 268.  Urine osm 176, urine Na 37, urine K 8, urine Cl 31.  TSH, cortisol are normal.  Received 1 L NS bolus on admission.  Sodium trend: 114 >> 120. --q6h BMP's for close monitoring   --not currently on fluids --caution with volume  (EF 40-45%) --nephrology consulted --hold diuretics  Hypochloremia - present on admission, Cl 77.  Likely due to diuretics as well.  Expect improvement as sodium normalizes.  Monitor BMP.  Hypertensive Urgency - present on admission with BP 175/108.   Resolved.  Unknown medication compliance.  Appears home regimen is Coreg, Lasix, hydralazine.  Lasix on hold as above.  Now with low diastolic pressures in the 40's.  Hold all antihypertensives for now.   Generalized weakness - present on admission, due to electrolyte abnormalities.  PT/OT evaluations once improved.  Chronic combined systolic and diastolic CHF - echo on 03/14/43 showed EF 40-45% with grade III diastolic dysfunction.  Monitor volume status closely.  Hold diuretics due to hyponatremia.   Hypertension - chronic, uncontrolled on admission.  Medication held, as above.  Type 2 diabetes mellitus with CKD and HTN - well controlled. Last A1c 5.3% earlier this month.  Appears she is not on medications outpatient.  CKD stage IV - per chart review, may start on dialysis soon.  Nephrology consulted.   Avoid nephrotoxic agents and hypotension.  Renally dose meds as indicated.  Monitor BMP.  Anemia due to chronic kidney disease - Stable.  Monitor CBC.  Insomnia - continue home trazodone and melatonin.  Consider Remeron if poor appe  Blind in both eyes - chronic.    DVT prophylaxis: heparin  Diet:  Diet Orders (From admission, onward)    Start     Ordered   05/31/19 [phone]  Diet Carb Modified Fluid consistency: Thin; Room service appropriate? Yes  Diet effective now    Question Answer Comment  Diet-HS Snack? Nothing   Calorie Level Medium 1600-2000   Fluid consistency: Thin   Room service appropriate? Yes      05/31/19 0758            Code Status: Full Code    Subjective 05/31/19    Patient seen with daughter at bedside this AM.  Patient's RN is fluent in [place] translated during encounter.  Patient reports she is unable to  sleep.  States she has pain in her neck and back of head from laying in the bed.  Denies recent excessive thirst or drinking more fluid.  No fever/chills or other acute complaints.    Disposition Plan & Communication   Status is: Inpatient  Remains inpatient appropriate because:Persistent severe electrolyte disturbances   Dispo: The patient is from: Home              Anticipated d/c is to: Home              Anticipated d/c date is: 3 days              Patient currently is not medically stable to d/c.   Family Communication: daughter at bedside during encounter, updated and all questions answered.    Consults, Procedures, Significant Events   Consultants:   Nephrology  Procedures:   None  Antimicrobials:   None    Objective   Vitals:   05/31/19 0004 05/31/19 0545 05/31/19 0955 05/31/19 1159  BP: (!) 148/49 (!) 139/41 (!) 145/65 (!) 148/45  Pulse: 73 (!) 59 60 62  Resp: 16 20 16 16   Temp: 97.7 F (36.5 C) 97.6 F (36.4 C) 98.4 F (36.9 C) 97.6 F (36.4 C)  TempSrc: Oral Oral Oral Oral  SpO2: 100% 100% 100% 99%  Weight:        Intake/Output Summary (Last 24 hours) at 05/31/2019 1255 Last data filed at 05/31/2019 1009 Gross per 24 hour  Intake 1240 ml  Output 700 ml  Net 540 ml   Filed Weights   05/30/19 1945  Weight: 69.9 kg    Physical Exam:  General exam: awake, alert, no acute distress HEENT: moist mucus membranes, hearing grossly normal  Respiratory system: CTAB, no wheezes, rales or rhonchi, normal respiratory effort. Cardiovascular system: normal S1/S2, RRR, no JVD, murmurs, rubs, gallops, trace LE edema.   Central nervous system: A&O x3. no gross focal neurologic deficits, normal speech Extremities: moves all, normal tone Skin: dry, intact, normal temperature, normal color Psychiatry: normal mood, congruent affect  Labs   Data Reviewed: I have personally reviewed following labs and imaging studies  CBC: Recent Labs  Lab 05/30/19 2001    WBC 6.6  HGB 10.1*  HCT 27.6*  MCV 89.0  PLT 384   Basic Metabolic Panel: Recent Labs  Lab 05/30/19 2001 05/31/19 0802  NA 114* 120*  K 3.5 3.1*  CL 77* 84*  CO2 23 23  GLUCOSE 135* 97  BUN 76* 68*  CREATININE 4.69* 4.29*  CALCIUM 8.0* 8.0*   GFR: Estimated Creatinine Clearance: 10.5 mL/min (A) (by C-G formula based on SCr of 4.29 mg/dL (H)). Liver Function Tests: No results for input(s): AST, ALT, ALKPHOS, BILITOT, PROT, ALBUMIN in the last 168 hours. No results for input(s): LIPASE, AMYLASE in the last 168 hours. No results for input(s): AMMONIA in the last 168 hours. Coagulation Profile: No results for input(s):  INR, PROTIME in the last 168 hours. Cardiac Enzymes: No results for input(s): CKTOTAL, CKMB, CKMBINDEX, TROPONINI in the last 168 hours. BNP (last 3 results) No results for input(s): PROBNP in the last 8760 hours. HbA1C: No results for input(s): HGBA1C in the last 72 hours. CBG: Recent Labs  Lab 05/31/19 0811 05/31/19 1200  GLUCAP 101* 107*   Lipid Profile: No results for input(s): CHOL, HDL, LDLCALC, TRIG, CHOLHDL, LDLDIRECT in the last 72 hours. Thyroid Function Tests: Recent Labs    05/31/19 0802  TSH 0.781   Anemia Panel: No results for input(s): VITAMINB12, FOLATE, FERRITIN, TIBC, IRON, RETICCTPCT in the last 72 hours. Sepsis Labs: No results for input(s): PROCALCITON, LATICACIDVEN in the last 168 hours.  Recent Results (from the past 240 hour(s))  SARS Coronavirus 2 by RT PCR (hospital order, performed in [facility] hospital lab) Nasopharyngeal Nasopharyngeal Swab     Status: None   Collection Time: 05/30/19 10:27 PM   Specimen: Nasopharyngeal Swab  Result Value Ref Range Status   SARS Coronavirus 2 NEGATIVE NEGATIVE Final    Comment: (NOTE) SARS-CoV-2 target nucleic acids are NOT DETECTED. The SARS-CoV-2 RNA is generally detectable in upper and lower respiratory specimens during the acute phase of infection. The lowest concentration  of SARS-CoV-2 viral copies this assay can detect is 250 copies / mL. A negative result does not preclude SARS-CoV-2 infection and should not be used as the sole basis for treatment or other patient management decisions.  A negative result may occur with improper specimen collection / handling, submission of specimen other than nasopharyngeal swab, presence of viral mutation(s) within the areas targeted by this assay, and inadequate number of viral copies (<250 copies / mL). A negative result must be combined with clinical observations, patient history, and epidemiological information. Fact Sheet for Patients:   StrictlyIdeas.no Fact Sheet for Healthcare Providers: BankingDealers.co.za This test is not yet approved or cleared  by the [place] FDA and has been authorized for detection and/or diagnosis of SARS-CoV-2 by FDA under an Emergency Use Authorization (EUA).  This EUA will remain in effect (meaning this test can be used) for the duration of the COVID-19 declaration under Section 564(b)(1) of the Act, 21 U.S.C. section 360bbb-3(b)(1), unless the authorization is terminated or revoked sooner. Performed at [facility], [address] [place], [place] [address]       Imaging Studies   No results found.   Medications   Scheduled Meds: . aspirin EC  81 mg Oral Daily  . heparin injection (subcutaneous)  5,000 Units Subcutaneous Q12H  . insulin aspart  0-9 Units Subcutaneous TID PC & HS  . melatonin  2.5 mg Oral QHS  . pantoprazole  40 mg Oral Daily  . pravastatin  20 mg Oral q1800  . traZODone  50 mg Oral QHS  . [START ON 06/04/2019] Vitamin D (Ergocalciferol)  50,000 Units Oral Once per day on Sat   Continuous Infusions:     LOS: 1 day    Time spent: 42 minutes total with > 50% in coordination of care and direct patient contact.    Ezekiel Slocumb, DO [facility]  05/31/2019, 12:55 PM    If  7PM-7AM, please contact night-coverage. How to contact the [facility] Attending or Consulting provider [place] or covering provider during after hours [place], for this patient?    1. Check the care team in [facility] and look for a) attending/consulting [facility] provider listed and b) the [facility] team listed  2. Log into [email or web address] and use [place]'s universal password to access. If you do not have the password, please contact the hospital operator. 3. Locate the [facility] provider you are looking for under [facility] and page to a number that you can be directly reached. 4. If you still have difficulty reaching the provider, please page the [facility] (Director on Call) for the Hospitalists listed on amion for assistance.

## 2019-05-31 NOTE — Progress Notes (Signed)
OT Cancellation Note  Patient Details Name: [name] MRN: [id number] DOB: [birth year]   Cancelled Treatment:    Reason Eval/Treat Not Completed: Patient not medically ready. OT order received and chart reviewed. Pt noted to have critically low Na+ levels (114), per therapy protocols will hold and initiate services as pt appropriate.   Dessie Coma, M.S. OTR/L  05/31/19, 8:21 AM

## 2019-05-31 NOTE — Progress Notes (Signed)
Anticoagulation monitoring(Lovenox):  79yo  F ordered Lovenox 40 mg Q24h  Filed Weights   05/30/19 1945  Weight: 69.9 kg (154 lb)   BMI 26   Lab Results  Component Value Date   CREATININE 4.69 (H) 05/30/2019   CREATININE 4.65 (H) 05/15/2019   CREATININE 4.45 (H) 05/14/2019   Estimated Creatinine Clearance: 9.6 mL/min (A) (by C-G formula based on SCr of 4.69 mg/dL (H)).   Hemoglobin & Hematocrit     Component Value Date/Time   HGB 10.1 (L) 05/30/2019 2001   HGB 11.0 (L) 05/19/2013 0757   HCT 27.6 (L) 05/30/2019 2001   HCT 33.0 (L) 05/19/2013 0757    Per Protocol for Patient with estCrcl < 15 ml/min , will transition to Heparin 5000 units Subcutaneous Q12h     Chinita Greenland PharmD Clinical Pharmacist 05/31/2019

## 2019-05-31 NOTE — Progress Notes (Signed)
PT Cancellation Note  Patient Details Name: TRINIDEE SCHRAG MRN: [id number] DOB: 1940/05/06   Cancelled Treatment:    Reason Eval/Treat Not Completed: Medical issues which prohibited therapy Pt with critically low Na+ levels (114), not appropriate for PT at this time.  Will maintain on PT caseload, follow closely and attempt to evaluate pt when lab values are appropriate for activity with PT.    Kreg Shropshire, DPT 05/31/2019, 8:03 AM

## 2019-06-01 LAB — BASIC METABOLIC PANEL
Anion gap: 8 (ref 5–15)
BUN: 62 mg/dL — ABNORMAL HIGH (ref 8–23)
CO2: 25 mmol/L (ref 22–32)
Calcium: 7.9 mg/dL — ABNORMAL LOW (ref 8.9–10.3)
Chloride: 91 mmol/L — ABNORMAL LOW (ref 98–111)
Creatinine, Ser: 3.94 mg/dL — ABNORMAL HIGH (ref 0.44–1.00)
GFR calc Af Amer: 12 mL/min — ABNORMAL LOW (ref >60)
GFR calc non Af Amer: 10 mL/min — ABNORMAL LOW (ref >60)
Glucose, Bld: 87 mg/dL (ref 70–99)
Potassium: 4.1 mmol/L (ref 3.5–5.1)
Sodium: 124 mmol/L — ABNORMAL LOW (ref 135–145)

## 2019-06-01 LAB — CBC
HCT: 25.6 % — ABNORMAL LOW (ref 36.0–46.0)
Hemoglobin: 9 g/dL — ABNORMAL LOW (ref 12.0–15.0)
MCH: 32.4 pg (ref 26.0–34.0)
MCHC: 35.2 g/dL (ref 30.0–36.0)
MCV: 92.1 fL (ref 80.0–100.0)
Platelets: 222 10*3/uL (ref 150–400)
RBC: 2.78 MIL/uL — ABNORMAL LOW (ref 3.87–5.11)
RDW: 11.9 % (ref 11.5–15.5)
WBC: 5.1 10*3/uL (ref 4.0–10.5)
nRBC: 0 % (ref 0.0–0.2)

## 2019-06-01 LAB — GLUCOSE, CAPILLARY
Glucose-Capillary: 90 mg/dL (ref 70–99)
Glucose-Capillary: 138 mg/dL — ABNORMAL HIGH (ref 70–99)
Glucose-Capillary: 100 mg/dL — ABNORMAL HIGH (ref 70–99)
Glucose-Capillary: 115 mg/dL — ABNORMAL HIGH (ref 70–99)

## 2019-06-01 MED ORDER — SODIUM CHLORIDE 0.9 % IV SOLN: INTRAVENOUS | Status: AC

## 2019-06-01 MED ORDER — FERROUS SULFATE 325 (65 FE) MG PO TABS
325.0000 mg | ORAL_TABLET | Freq: Two times a day (BID) | ORAL | Status: DC
  Administered 2019-06-01 – 2019-06-03 (×4): 325 mg via ORAL
  Filled 2019-06-01 (×4): qty 1

## 2019-06-01 NOTE — Progress Notes (Signed)
PT Cancellation Note  Patient Details Name: Robin Rogers MRN: [id number] DOB: 1940-06-30   Cancelled Treatment:    Reason Eval/Treat Not Completed: Medical issues which prohibited therapy(Chart reviewed, Na+ remains outside of facility guidelines for participating in PT services. Will continue to follow, attempt evaluate at later date/time once medically appropriate.)  8:36 AM, 06/01/19 Etta Grandchild, PT, DPT Physical Therapist - [facility]  [phone] ([place])    [place] C 06/01/2019, 8:36 AM

## 2019-06-01 NOTE — Progress Notes (Signed)
[facility]  ROUNDING NOTE   Subjective:   Conversation via [place] interpreter Patient was sent to the emergency room from [name] for evaluation of dialysis She was found to have severe hyponatremia Patient's family member is in the room with her Overall she is improved.  She was able to sleep last night.  She is also able to eat without nausea or vomiting. Serum creatinine has decreased slightly to 3.94   Objective:  Vital signs in last 24 hours:  Temp:  [97.6 F (36.4 C)-98.2 F (36.8 C)] 98.1 F (36.7 C) (05/26 0918) Pulse Rate:  [57-63] 63 (05/26 0918) Resp:  [16] 16 (05/26 0559) BP: (135-156)/(45-51) 156/51 (05/26 0918) SpO2:  [98 %-100 %] 100 % (05/26 0918)  Weight change:  Filed Weights   05/30/19 1945  Weight: 69.9 kg    Intake/Output: I/O last 3 completed shifts: In: 2076.2 [P.O.:480; I.V.:596.2; IV Piggyback:1000] Out: 1700 [Urine:1700]   Intake/Output this shift:  Total I/O In: 120 [P.O.:120] Out: -   Physical Exam: General: No acute distress  Head: Normocephalic, atraumatic. Moist oral mucosal membranes  Eyes: Anicteric  Neck: Supple,   Lungs:  Clear to auscultation, normal effort  Heart: S1S2 no rubs  Abdomen:  Soft, nontender, bowel sounds present  Extremities: Trace peripheral edema.  Neurologic: Awake, alert, following commands  Skin: No lesions  Access: None    Basic Metabolic Panel: Recent Labs  Lab 05/30/19 2001 05/30/19 2001 05/31/19 0802 05/31/19 0802 05/31/19 1412 05/31/19 2000 06/01/19 0500  NA 114*  --  120*  --  119* 121* 124*  K 3.5  --  3.1*  --  3.9 4.3 4.1  CL 77*  --  84*  --  86* 89* 91*  CO2 23  --  23  --  22 22 25   GLUCOSE 135*  --  97  --  141* 87 87  BUN 76*  --  68*  --  69* 64* 62*  CREATININE 4.69*  --  4.29*  --  4.19* 4.14* 3.94*  CALCIUM 8.0*   < > 8.0*   < > 7.7* 7.7* 7.9*   < > = values in this interval not displayed.    Liver Function Tests: No results for input(s): AST, ALT,  ALKPHOS, BILITOT, PROT, ALBUMIN in the last 168 hours. No results for input(s): LIPASE, AMYLASE in the last 168 hours. No results for input(s): AMMONIA in the last 168 hours.  CBC: Recent Labs  Lab 05/30/19 2001 06/01/19 0500  WBC 6.6 5.1  HGB 10.1* 9.0*  HCT 27.6* 25.6*  MCV 89.0 92.1  PLT 260 222    Cardiac Enzymes: No results for input(s): CKTOTAL, CKMB, CKMBINDEX, TROPONINI in the last 168 hours.  BNP: Invalid input(s): POCBNP  CBG: Recent Labs  Lab 05/31/19 0811 05/31/19 1200 05/31/19 1637 05/31/19 2125 06/01/19 0810  GLUCAP 101* 107* 87 98 90    Microbiology: Results for orders placed or performed during the hospital encounter of 05/30/19  SARS Coronavirus 2 by RT PCR (hospital order, performed in [facility] hospital lab) Nasopharyngeal Nasopharyngeal Swab     Status: None   Collection Time: 05/30/19 10:27 PM   Specimen: Nasopharyngeal Swab  Result Value Ref Range Status   SARS Coronavirus 2 NEGATIVE NEGATIVE Final    Comment: (NOTE) SARS-CoV-2 target nucleic acids are NOT DETECTED. The SARS-CoV-2 RNA is generally detectable in upper and lower respiratory specimens during the acute phase of infection. The lowest concentration of SARS-CoV-2 viral copies this assay  can detect is 250 copies / mL. A negative result does not preclude SARS-CoV-2 infection and should not be used as the sole basis for treatment or other patient management decisions.  A negative result may occur with improper specimen collection / handling, submission of specimen other than nasopharyngeal swab, presence of viral mutation(s) within the areas targeted by this assay, and inadequate number of viral copies (<250 copies / mL). A negative result must be combined with clinical observations, patient history, and epidemiological information. Fact Sheet for Patients:   StrictlyIdeas.no Fact Sheet for Healthcare  Providers: BankingDealers.co.za This test is not yet approved or cleared  by the [place] FDA and has been authorized for detection and/or diagnosis of SARS-CoV-2 by FDA under an Emergency Use Authorization (EUA).  This EUA will remain in effect (meaning this test can be used) for the duration of the COVID-19 declaration under Section 564(b)(1) of the Act, 21 U.S.C. section 360bbb-3(b)(1), unless the authorization is terminated or revoked sooner. Performed at [facility], [address] [place], [place] [address]     Coagulation Studies: No results for input(s): LABPROT, INR in the last 72 hours.  Urinalysis: Recent Labs    05/31/19 0802  COLORURINE STRAW*  LABSPEC 1.005  PHURINE 6.0  GLUCOSEU NEGATIVE  HGBUR NEGATIVE  BILIRUBINUR NEGATIVE  KETONESUR NEGATIVE  PROTEINUR 100*  NITRITE NEGATIVE  LEUKOCYTESUR NEGATIVE      Imaging: No results found.   Medications:   . sodium chloride     . aspirin EC  81 mg Oral Daily  . heparin injection (subcutaneous)  5,000 Units Subcutaneous Q12H  . melatonin  2.5 mg Oral QHS  . pantoprazole  40 mg Oral Daily  . pravastatin  20 mg Oral q1800  . traZODone  50 mg Oral QHS  . [START ON 06/04/2019] Vitamin D (Ergocalciferol)  50,000 Units Oral Once per day on Sat   acetaminophen **OR** acetaminophen, labetalol, magnesium hydroxide, ondansetron **OR** ondansetron (ZOFRAN) IV  Assessment/ Plan:  79 y.o. 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  #Severe hyponatremia And hypokalemia Likely secondary to aggressive diuresis Home dose of Lasix 40 mg twice a day-now on hold Continue normal diet and await for sodium to improve slowly  #Chronic kidney disease stage V.  During previous  admission, nephrology team had a long discussion with the patient about renal replacement therapy.  Patient and her daughter previously decided they did not want to start dialysis. Concept of renal replacement therapy was presented to the patient again.  She has stated that she will do it if absolutely necessary.  However, we would still discuss this with family during the stay. Serum creatinine has slightly improved.  Patient appears to be back to her baseline health status.  No clear uremic symptoms.  No acute indication for dialysis at present.  If discharged, further care may be pursued as outpatient.  #Hyperuricemia Can consider starting low-dose allopurinol 50 mg every other day    LOS: 2 Manolito Jurewicz 5/26/202110:25 AM

## 2019-06-01 NOTE — Progress Notes (Signed)
PROGRESS NOTE    Robin Rogers   LPF:[id number]  DOB: [birth year]  PCP: [facility] [place]    DOA: 05/30/2019 LOS: 2   Brief Narrative   Robin Rogers  is a 79 y.o. Hispanic female with a medical history of blindness, type 2 diabetes mellitus CHF, hypertension, stage IV chronic kidney disease and anemia, presented to the ED on 05/30/19 with complaints of of generalized weakness, poor appetite, insomnia and occasional nausea vomiting for about a week.  In the ED, hypertensive 175/108, otherwise normal vitals.  Labs notable for severe hyponatremia (Na 114), hypochloremia (Cl 77), potassium 3.5, renal function stable from earlier this month, chronic stable anemia.  ECG showed 1st degree AV block with prolonged QTc of 490 ms.  Admitted to hospitalist service for further evaluation and management.   Upon chart review, patient was admitted for acute CHF decompensation earlier this month, discharged on 05/16/19 on oral Lasix 40 mg BID.  It appears she was new to taking diuretics at that time.  Sodium was normal at 135 prior to discharge.      Assessment & Plan   Principal Problem:   Hyponatremia Active Problems:   Chronic combined systolic and diastolic CHF (congestive heart failure) (HCC)   HTN (hypertension)   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 (HCC)   CKD (chronic kidney disease), stage IV (HCC)   Insomnia   Generalized weakness   Anemia due to chronic kidney disease   Blind in both eyes   Hypertensive urgency   Hyponatremia, hypovolemic - present on admission, improved Na 114 on presentation, up to 124 today --Suspect multifactorial with diuretics that were started recently for CHF, renal failure contributing.  Hypotonic, sOsm 268.  Urine osm 176, urine Na 37, urine K 8, urine Cl 31.  TSH, cortisol are normal.  Received 1 L NS bolus on admission.  PLAN: --trend Na --continue NS@75  for 12 hours --encourage oral fluid  intake --hold diuretics  Hypochloremia - present on admission, Cl 77.  Likely due to diuretics as well.  Expect improvement as sodium normalizes.  Monitor BMP.  Hypertensive Urgency - present on admission with BP 175/108.   Resolved.  Appears home regimen is Coreg, Lasix, hydralazine.  Lasix on hold as above.  Now with low diastolic pressures in the 40's.  Hold all antihypertensives for now.   Generalized weakness - present on admission, due to electrolyte abnormalities.   --PT/OT evaluations   Chronic combined systolic and diastolic CHF - echo on 05/09/27 showed EF 40-45% with grade III diastolic dysfunction.  Monitor volume status closely.  Hold diuretics due to hyponatremia.  Type 2 diabetes mellitus with CKD and HTN - well controlled. Last A1c 5.3% earlier this month.  Appears she is not on medications outpatient.  CKD stage IV - per chart review, may start on dialysis soon.  Nephrology consulted.   Avoid nephrotoxic agents and hypotension.  Renally dose meds as indicated.  Monitor BMP.  Anemia due to chronic kidney disease - Stable.  Monitor CBC.  Insomnia - continue home trazodone and melatonin.  Consider Remeron if poor appe  Blind in both eyes - chronic.    DVT prophylaxis: heparin  Diet:  Diet Orders (From admission, onward)    Start     Ordered   05/31/19 0758  Diet Carb Modified Fluid consistency: Thin; Room service appropriate? Yes  Diet effective now    Question Answer Comment  Diet-HS Snack? Nothing   Calorie  Level Medium 1600-2000   Fluid consistency: Thin   Room service appropriate? Yes      05/31/19 0758            Code Status: Full Code    Subjective    Pt complained of some back soreness from lying in bed for too long, but otherwise doing better.  Normal PO intake and urination.  No fever, dyspnea, chest pain, abdominal pain, N/V/D.  Up and walked with PT today.   Disposition Plan & Communication   Status is: Inpatient  Remains inpatient  appropriate because:Persistent severe electrolyte disturbances   Dispo: The patient is from: Home              Anticipated d/c is to: Home              Anticipated d/c date is: 1-2 days              Patient currently is not medically stable to d/c.  Na still low at 124.   Family Communication: son updated at the bedside today with Patent attorney.    Consults, Procedures, Significant Events   Consultants:   Nephrology  Procedures:   None  Antimicrobials:   None    Objective   Vitals:   05/31/19 2122 06/01/19 0559 06/01/19 0918 06/01/19 1146  BP: (!) 140/45 (!) 135/46 (!) 156/51 (!) 146/48  Pulse: (!) 57 61 63 61  Resp: 16 16  17   Temp: 98.2 F (36.8 C) 98 F (36.7 C) 98.1 F (36.7 C) 98.6 F (37 C)  TempSrc: Oral Oral Oral Oral  SpO2: 98% 100% 100% 100%  Weight:        Intake/Output Summary (Last 24 hours) at 06/01/2019 1806 Last data filed at 06/01/2019 1431 Gross per 24 hour  Intake 892.88 ml  Output 1700 ml  Net -807.12 ml   Filed Weights   05/30/19 1945  Weight: 69.9 kg    Physical Exam:  Constitutional: NAD, AAOx3 HEENT: conjunctivae and lids normal, EOMI CV: RRR no M,R,G. Distal pulses +2.  No cyanosis.   RESP: CTA B/L, normal respiratory effort  GI: +BS, NTND Extremities: No effusions, edema, or tenderness in BLE SKIN: warm, dry and intact Neuro: II - XII grossly intact.  Sensation intact Psych: Normal mood and affect.      Labs   Data Reviewed: I have personally reviewed following labs and imaging studies  CBC: Recent Labs  Lab 05/30/19 2001 06/01/19 0500  WBC 6.6 5.1  HGB 10.1* 9.0*  HCT 27.6* 25.6*  MCV 89.0 92.1  PLT 260 119   Basic Metabolic Panel: Recent Labs  Lab 05/30/19 2001 05/31/19 0802 05/31/19 1412 05/31/19 2000 06/01/19 0500  NA 114* 120* 119* 121* 124*  K 3.5 3.1* 3.9 4.3 4.1  CL 77* 84* 86* 89* 91*  CO2 23 23 22 22 25   GLUCOSE 135* 97 141* 87 87  BUN 76* 68* 69* 64* 62*  CREATININE 4.69* 4.29*  4.19* 4.14* 3.94*  CALCIUM 8.0* 8.0* 7.7* 7.7* 7.9*   GFR: Estimated Creatinine Clearance: 11.5 mL/min (A) (by C-G formula based on SCr of 3.94 mg/dL (H)). Liver Function Tests: No results for input(s): AST, ALT, ALKPHOS, BILITOT, PROT, ALBUMIN in the last 168 hours. No results for input(s): LIPASE, AMYLASE in the last 168 hours. No results for input(s): AMMONIA in the last 168 hours. Coagulation Profile: No results for input(s): INR, PROTIME in the last 168 hours. Cardiac Enzymes: No results for input(s): CKTOTAL, CKMB, CKMBINDEX,  TROPONINI in the last 168 hours. BNP (last 3 results) No results for input(s): PROBNP in the last 8760 hours. HbA1C: No results for input(s): HGBA1C in the last 72 hours. CBG: Recent Labs  Lab 05/31/19 1637 05/31/19 2125 06/01/19 0810 06/01/19 1144 06/01/19 1635  GLUCAP 87 98 90 138* 100*   Lipid Profile: No results for input(s): CHOL, HDL, LDLCALC, TRIG, CHOLHDL, LDLDIRECT in the last 72 hours. Thyroid Function Tests: Recent Labs    05/31/19 0802  TSH 0.781   Anemia Panel: No results for input(s): VITAMINB12, FOLATE, FERRITIN, TIBC, IRON, RETICCTPCT in the last 72 hours. Sepsis Labs: No results for input(s): PROCALCITON, LATICACIDVEN in the last 168 hours.  Recent Results (from the past 240 hour(s))  SARS Coronavirus 2 by RT PCR (hospital order, performed in [facility] hospital lab) Nasopharyngeal Nasopharyngeal Swab     Status: None   Collection Time: 05/30/19 10:27 PM   Specimen: Nasopharyngeal Swab  Result Value Ref Range Status   SARS Coronavirus 2 NEGATIVE NEGATIVE Final    Comment: (NOTE) SARS-CoV-2 target nucleic acids are NOT DETECTED. The SARS-CoV-2 RNA is generally detectable in upper and lower respiratory specimens during the acute phase of infection. The lowest concentration of SARS-CoV-2 viral copies this assay can detect is 250 copies / mL. A negative result does not preclude SARS-CoV-2 infection and should not be used as  the sole basis for treatment or other patient management decisions.  A negative result may occur with improper specimen collection / handling, submission of specimen other than nasopharyngeal swab, presence of viral mutation(s) within the areas targeted by this assay, and inadequate number of viral copies (<250 copies / mL). A negative result must be combined with clinical observations, patient history, and epidemiological information. Fact Sheet for Patients:   StrictlyIdeas.no Fact Sheet for Healthcare Providers: BankingDealers.co.za This test is not yet approved or cleared  by the [place] FDA and has been authorized for detection and/or diagnosis of SARS-CoV-2 by FDA under an Emergency Use Authorization (EUA).  This EUA will remain in effect (meaning this test can be used) for the duration of the COVID-19 declaration under Section 564(b)(1) of the Act, 21 U.S.C. section 360bbb-3(b)(1), unless the authorization is terminated or revoked sooner. Performed at [facility], [address], [place], [place] [address]       Imaging Studies   No results found.   Medications   Scheduled Meds: . aspirin EC  81 mg Oral Daily  . ferrous sulfate  325 mg Oral BID WC  . heparin injection (subcutaneous)  5,000 Units Subcutaneous Q12H  . melatonin  2.5 mg Oral QHS  . pantoprazole  40 mg Oral Daily  . pravastatin  20 mg Oral q1800  . traZODone  50 mg Oral QHS  . [START ON 06/04/2019] Vitamin D (Ergocalciferol)  50,000 Units Oral Once per day on Sat   Continuous Infusions: . sodium chloride 75 mL/hr at 06/01/19 1356       LOS: 2 days      Enzo Bi, MD [facility]  06/01/2019, 6:06 PM

## 2019-06-01 NOTE — Evaluation (Addendum)
Physical Therapy Evaluation Patient Details Name: Robin Rogers MRN: [id number] DOB: [birth year] Today's Date: 06/01/2019   History of Present Illness  Robin Rogers  is a 79 y.o. Hispanic female with a medical history of blindness, type 2 diabetes mellitus CHF, hypertension, stage IV chronic kidney disease and anemia, presented to the ED on 05/30/19 with complaints of of generalized weakness, poor appetite, insomnia and occasional nausea vomiting for about a week.  In the ED, hypertensive 175/108, otherwise normal vitals.  Labs notable for severe hyponatremia (Na 114), hypochloremia (Cl 77), potassium 3.5, renal function stable from earlier this month, chronic stable anemia.  ECG showed 1st degree AV block with prolonged QTc of 490 ms.  Clinical Impression  Pt admitted with above diagnosis. Pt currently with functional limitations due to the deficits listed below (see "PT Problem List"). Pt with continued hypoNa+, but MD gives clearance for activity with PT. Upon entry, pt in bed, awake and agreeable to participate. Son at bedside. Interpreter Robin Rogers assists with communication. The pt is alert and oriented x3, pleasant, conversational, and generally a good historian- details taken from son. Modified independence with bed mobility, minA for HHA in transfers and gait, fairly good tolerance, but pt notes increased unsteadiness of gait. Pt highly motivated to mobilize and stay up in chair at end of session. Functional mobility assessment demonstrates increased effort/time requirements, poor tolerance, and need for physical assistance, whereas the patient performed these at a higher level of independence PTA. Pt will benefit from skilled PT intervention to increase independence and safety with basic mobility in preparation for discharge to the venue listed below.       Follow Up Recommendations Home health PT;Supervision for mobility/OOB    Equipment Recommendations  None recommended by PT     Recommendations for Other Services       Precautions / Restrictions Precautions Precautions: Fall Restrictions Weight Bearing Restrictions: No      Mobility  Bed Mobility Overal bed mobility: Modified Independent             General bed mobility comments: additional time, moderate effort  Transfers Overall transfer level: Modified independent Equipment used: 1 person hand held assist             General transfer comment: additional time, moderate effort, offered a hand for assist due to chronic blindness  Ambulation/Gait   Gait Distance (Feet): 120 Feet Assistive device: 1 person hand held assist       General Gait Details: short choppy steps, mild dizziness, moderate instability, but able to be safe and steady with HHA 2>1  Stairs            Wheelchair Mobility    Modified Rankin (Stroke Patients Only)       Balance Overall balance assessment: Mild deficits observed, not formally tested;Needs assistance   Sitting balance-Leahy Scale: Good     Standing balance support: Single extremity supported;During functional activity Standing balance-Leahy Scale: Fair                               Pertinent Vitals/Pain Pain Assessment: No/denies pain    Home Living Family/patient expects to be discharged to:: Private residence Living Arrangements: Spouse/significant other Available Help at Discharge: Family;Available 24 hours/day Type of Home: House Home Access: Ramped entrance     Home Layout: One level Home Equipment: Cane - single point Additional Comments: Pt uses handheld assist to mobilize due to blindness.  Prior Function           Comments: Pt uses handheld assist to mobilize due to blindness. Stated she does not need help with ADLs     Hand Dominance        Extremity/Trunk Assessment   Upper Extremity Assessment Upper Extremity Assessment: Generalized weakness;Overall [facility] for tasks assessed    Lower  Extremity Assessment Lower Extremity Assessment: Generalized weakness;Overall [facility] for tasks assessed    Cervical / Trunk Assessment Cervical / Trunk Assessment: Normal  Communication   Communication: Interpreter utilized  Cognition Arousal/Alertness: Awake/alert Behavior During Therapy: WFL for tasks assessed/performed Overall Cognitive Status: Within Functional Limits for tasks assessed                                        General Comments      Exercises     Assessment/Plan    PT Assessment Patient needs continued PT services  PT Problem List Decreased strength;Decreased activity tolerance;Decreased balance;Decreased mobility;Decreased knowledge of use of DME       PT Treatment Interventions DME instruction;Gait training;Stair training;Functional mobility training;Therapeutic activities;Therapeutic exercise;Balance training;Patient/family education    PT Goals (Current goals can be found in the Care Plan section)  Acute Rehab PT Goals Patient Stated Goal: return to home, get out of bed so back feels better PT Goal Formulation: With patient Time For Goal Achievement: 06/15/19 Potential to Achieve Goals: Good    Frequency Min 2X/week   Barriers to discharge        Co-evaluation               AM-PAC PT "6 Clicks" Mobility  Outcome Measure Help needed turning from your back to your side while in a flat bed without using bedrails?: None Help needed moving from lying on your back to sitting on the side of a flat bed without using bedrails?: None Help needed moving to and from a bed to a chair (including a wheelchair)?: A Little Help needed standing up from a chair using your arms (e.g., wheelchair or bedside chair)?: A Little Help needed to walk in hospital room?: A Little Help needed climbing 3-5 steps with a railing? : A Lot 6 Click Score: 19    End of Session Equipment Utilized During Treatment: Gait belt Activity Tolerance: Patient  tolerated treatment well;No increased pain Patient left: with family/visitor present;with call bell/phone within reach;in chair;with chair alarm set Nurse Communication: Mobility status PT Visit Diagnosis: Unsteadiness on feet (R26.81);Difficulty in walking, not elsewhere classified (R26.2);Muscle weakness (generalized) (M62.81)    Time: 5093-2671 PT Time Calculation (min) (ACUTE ONLY): 22 min   Charges:   PT Evaluation $PT Eval Moderate Complexity: 1 Mod          5:30 PM, 06/01/19 Etta Grandchild, PT, DPT Physical Therapist - [facility]  [phone] ([place])    [place] [address] 06/01/2019, 5:28 PM

## 2019-06-01 NOTE — Progress Notes (Signed)
OT Cancellation Note  Patient Details Name: Robin Rogers MRN: [id number] DOB: Jan 02, 1941   Cancelled Treatment:    Reason Eval/Treat Not Completed: OT screened, no needs identified, will sign off. Order received and chart reviewed. Spoke with pt primary physical therapist who was just leaving pt room. Physical therapist indicates pt presents at or near baseline level of functional independence to perform BADL tasks. Pt performing functional mobility at baseline level. Does not appear to have acute OT needs at this time. Will sign off. Please re-consult if additional OT needs arise during this admission.   Shara Blazing, M.S., OTR/L Ascom: [phone] 06/01/19, 2:59 PM

## 2019-06-02 LAB — CBC
HCT: 24.8 % — ABNORMAL LOW (ref 36.0–46.0)
Hemoglobin: 8.7 g/dL — ABNORMAL LOW (ref 12.0–15.0)
MCH: 31.9 pg (ref 26.0–34.0)
MCHC: 35.1 g/dL (ref 30.0–36.0)
MCV: 90.8 fL (ref 80.0–100.0)
Platelets: 217 10*3/uL (ref 150–400)
RBC: 2.73 MIL/uL — ABNORMAL LOW (ref 3.87–5.11)
RDW: 12.1 % (ref 11.5–15.5)
WBC: 5.4 10*3/uL (ref 4.0–10.5)
nRBC: 0 % (ref 0.0–0.2)

## 2019-06-02 LAB — MAGNESIUM: Magnesium: 1.6 mg/dL — ABNORMAL LOW (ref 1.7–2.4)

## 2019-06-02 LAB — GLUCOSE, CAPILLARY
Glucose-Capillary: 87 mg/dL (ref 70–99)
Glucose-Capillary: 98 mg/dL (ref 70–99)
Glucose-Capillary: 85 mg/dL (ref 70–99)
Glucose-Capillary: 102 mg/dL — ABNORMAL HIGH (ref 70–99)

## 2019-06-02 LAB — BASIC METABOLIC PANEL WITH GFR
Anion gap: 6 (ref 5–15)
BUN: 63 mg/dL — ABNORMAL HIGH (ref 8–23)
CO2: 23 mmol/L (ref 22–32)
Calcium: 7.8 mg/dL — ABNORMAL LOW (ref 8.9–10.3)
Chloride: 98 mmol/L (ref 98–111)
Creatinine, Ser: 3.55 mg/dL — ABNORMAL HIGH (ref 0.44–1.00)
GFR calc Af Amer: 14 mL/min — ABNORMAL LOW
GFR calc non Af Amer: 12 mL/min — ABNORMAL LOW
Glucose, Bld: 79 mg/dL (ref 70–99)
Potassium: 4.6 mmol/L (ref 3.5–5.1)
Sodium: 127 mmol/L — ABNORMAL LOW (ref 135–145)

## 2019-06-02 MED ORDER — MAGNESIUM SULFATE 2 GM/50ML IV SOLN
2.0000 g | Freq: Once | INTRAVENOUS | Status: AC
  Administered 2019-06-02: 2 g via INTRAVENOUS
  Filled 2019-06-02: qty 50

## 2019-06-02 MED ORDER — LACTULOSE 10 GM/15ML PO SOLN
30.0000 g | Freq: Every day | ORAL | Status: DC
  Administered 2019-06-02 – 2019-06-03 (×2): 30 g via ORAL
  Filled 2019-06-02 (×2): qty 60

## 2019-06-02 NOTE — Progress Notes (Signed)
PROGRESS NOTE    Robin Rogers   ERX:[id number]  DOB: Feb 15, 1940  PCP: [facility]    DOA: 05/30/2019 LOS: 3   Brief Narrative   Robin Rogers  is a 79 y.o. Hispanic female with a medical history of blindness, type 2 diabetes mellitus CHF, hypertension, stage IV chronic kidney disease and anemia, presented to the ED on 05/30/19 with complaints of of generalized weakness, poor appetite, insomnia and occasional nausea vomiting for about a week.  In the ED, hypertensive 175/108, otherwise normal vitals.  Labs notable for severe hyponatremia (Na 114), hypochloremia (Cl 77), potassium 3.5, renal function stable from earlier this month, chronic stable anemia.  ECG showed 1st degree AV block with prolonged QTc of 490 ms.  Admitted to hospitalist service for further evaluation and management.   Upon chart review, patient was admitted for acute CHF decompensation earlier this month, discharged on 05/16/19 on oral Lasix 40 mg BID.  It appears she was new to taking diuretics at that time.  Sodium was normal at 135 prior to discharge.      Assessment & Plan   Principal Problem:   Hyponatremia Active Problems:   Chronic combined systolic and diastolic CHF (congestive heart failure) (HCC)   HTN (hypertension)   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 (HCC)   CKD (chronic kidney disease), stage IV (HCC)   Insomnia   Generalized weakness   Anemia due to chronic kidney disease   Blind in both eyes   Hypertensive urgency   Hyponatremia, hypovolemic - present on admission, improved Na 114 on presentation, up to 127 today --Suspect multifactorial with diuretics that were started recently for CHF, renal failure contributing.  Hypotonic, sOsm 268.  Urine osm 176, urine Na 37, urine K 8, urine Cl 31.  TSH, cortisol are normal.  Received 1 L NS bolus on admission.  --s/p NS infusion PLAN: --trend Na --Hold MIVF --encourage oral fluid  intake --hold diuretics  Hypochloremia - present on admission, Cl 77.  Likely due to diuretics as well.  Expect improvement as sodium normalizes.  Monitor BMP.  Hypertensive Urgency - present on admission with BP 175/108.   Resolved.  Appears home regimen is Coreg, Lasix, hydralazine.  Lasix on hold as above.  Now with low diastolic pressures in the 40's.  Hold all antihypertensives for now.   Generalized weakness - present on admission, due to electrolyte abnormalities.   --PT/OT evaluations   Chronic combined systolic and diastolic CHF - echo on 09/11/07 showed EF 40-45% with grade III diastolic dysfunction.  Monitor volume status closely.  Hold diuretics due to hyponatremia.  Type 2 diabetes mellitus with CKD and HTN - well controlled. Last A1c 5.3% earlier this month.  Appears she is not on medications outpatient.  CKD stage IV - per chart review, may start on dialysis soon.  Nephrology consulted.   Avoid nephrotoxic agents and hypotension.  Renally dose meds as indicated.  Monitor BMP.  Anemia due to chronic kidney disease - Stable.  Monitor CBC.  Insomnia - continue home trazodone and melatonin.  Consider Remeron if poor appe  Blind in both eyes - chronic.    DVT prophylaxis: heparin  Diet:  Diet Orders (From admission, onward)    Start     Ordered   05/31/19 0758  Diet Carb Modified Fluid consistency: Thin; Room service appropriate? Yes  Diet effective now    Question Answer Comment  Diet-HS Snack? Nothing   Calorie  Level Medium 1600-2000   Fluid consistency: Thin   Room service appropriate? Yes      05/31/19 0758            Code Status: Full Code    Subjective    Pt reported feeling better, however, doesn't like the food and hasn't been eating, therefore complained of being hungry.  No fever, dyspnea, chest pain, abdominal pain, N/V/D, dysuria.   Disposition Plan & Communication   Status is: Inpatient  Remains inpatient appropriate because:Persistent severe  electrolyte disturbances   Dispo: The patient is from: Home              Anticipated d/c is to: Home              Anticipated d/c date is: tomorrow Patient currently is not medically stable to d/c.  Na still low at 127.  If Na improves tomorrow without NS infusion, then will discharge.   Family Communication: son updated at the bedside today with Music therapist.    Consults, Procedures, Significant Events   Consultants:   Nephrology  Procedures:   None  Antimicrobials:   None    Objective   Vitals:   06/01/19 1146 06/01/19 1935 06/02/19 0627 06/02/19 1142  BP: (!) 146/48 (!) 153/53 (!) 169/64 (!) 150/46  Pulse: 61 75 72 66  Resp: 17 16 20 20   Temp: 98.6 F (37 C) 97.9 F (36.6 C) 98.2 F (36.8 C) 98.1 F (36.7 C)  TempSrc: Oral Oral Oral Oral  SpO2: 100% 100% 99% 99%  Weight:        Intake/Output Summary (Last 24 hours) at 06/02/2019 1909 Last data filed at 06/02/2019 1440 Gross per 24 hour  Intake 290 ml  Output 2000 ml  Net -1710 ml   Filed Weights   05/30/19 1945  Weight: 69.9 kg    Physical Exam:  Constitutional: NAD, AAOx3 HEENT: blind CV: RRR no M,R,G. Distal pulses +2.  No cyanosis.   RESP: CTA B/L, normal respiratory effort  GI: +BS, NTND Extremities: No effusions, edema, or tenderness in BLE SKIN: warm, dry and intact Neuro: II - XII grossly intact.  Sensation intact Psych: Normal mood and affect.      Labs   Data Reviewed: I have personally reviewed following labs and imaging studies  CBC: Recent Labs  Lab 05/30/19 2001 06/01/19 0500 06/02/19 0521  WBC 6.6 5.1 5.4  HGB 10.1* 9.0* 8.7*  HCT 27.6* 25.6* 24.8*  MCV 89.0 92.1 90.8  PLT 260 222 751   Basic Metabolic Panel: Recent Labs  Lab 05/31/19 0802 05/31/19 1412 05/31/19 2000 06/01/19 0500 06/02/19 0521  NA 120* 119* 121* 124* 127*  K 3.1* 3.9 4.3 4.1 4.6  CL 84* 86* 89* 91* 98  CO2 23 22 22 25 23   GLUCOSE 97 141* 87 87 79  BUN 68* 69* 64* 62* 63*  CREATININE  4.29* 4.19* 4.14* 3.94* 3.55*  CALCIUM 8.0* 7.7* 7.7* 7.9* 7.8*  MG  --   --   --   --  1.6*   GFR: Estimated Creatinine Clearance: 12.7 mL/min (A) (by C-G formula based on SCr of 3.55 mg/dL (H)). Liver Function Tests: No results for input(s): AST, ALT, ALKPHOS, BILITOT, PROT, ALBUMIN in the last 168 hours. No results for input(s): LIPASE, AMYLASE in the last 168 hours. No results for input(s): AMMONIA in the last 168 hours. Coagulation Profile: No results for input(s): INR, PROTIME in the last 168 hours. Cardiac Enzymes: No results for input(s):  CKTOTAL, CKMB, CKMBINDEX, TROPONINI in the last 168 hours. BNP (last 3 results) No results for input(s): PROBNP in the last 8760 hours. HbA1C: No results for input(s): HGBA1C in the last 72 hours. CBG: Recent Labs  Lab 06/01/19 1635 06/01/19 2117 06/02/19 0759 06/02/19 1142 06/02/19 1652  GLUCAP 100* 115* 87 98 85   Lipid Profile: No results for input(s): CHOL, HDL, LDLCALC, TRIG, CHOLHDL, LDLDIRECT in the last 72 hours. Thyroid Function Tests: Recent Labs    05/31/19 0802  TSH 0.781   Anemia Panel: No results for input(s): VITAMINB12, FOLATE, FERRITIN, TIBC, IRON, RETICCTPCT in the last 72 hours. Sepsis Labs: No results for input(s): PROCALCITON, LATICACIDVEN in the last 168 hours.  Recent Results (from the past 240 hour(s))  SARS Coronavirus 2 by RT PCR (hospital order, performed in [facility] hospital lab) Nasopharyngeal Nasopharyngeal Swab     Status: None   Collection Time: 05/30/19 10:27 PM   Specimen: Nasopharyngeal Swab  Result Value Ref Range Status   SARS Coronavirus 2 NEGATIVE NEGATIVE Final    Comment: (NOTE) SARS-CoV-2 target nucleic acids are NOT DETECTED. The SARS-CoV-2 RNA is generally detectable in upper and lower respiratory specimens during the acute phase of infection. The lowest concentration of SARS-CoV-2 viral copies this assay can detect is 250 copies / mL. A negative result does not preclude  SARS-CoV-2 infection and should not be used as the sole basis for treatment or other patient management decisions.  A negative result may occur with improper specimen collection / handling, submission of specimen other than nasopharyngeal swab, presence of viral mutation(s) within the areas targeted by this assay, and inadequate number of viral copies (<250 copies / mL). A negative result must be combined with clinical observations, patient history, and epidemiological information. Fact Sheet for Patients:   StrictlyIdeas.no Fact Sheet for Healthcare Providers: BankingDealers.co.za This test is not yet approved or cleared  by the [place] FDA and has been authorized for detection and/or diagnosis of SARS-CoV-2 by FDA under an Emergency Use Authorization (EUA).  This EUA will remain in effect (meaning this test can be used) for the duration of the COVID-19 declaration under Section 564(b)(1) of the Act, 21 U.S.C. section 360bbb-3(b)(1), unless the authorization is terminated or revoked sooner. Performed at [facility], [address] [place], [place] [address]       Imaging Studies   No results found.   Medications   Scheduled Meds: . aspirin EC  81 mg Oral Daily  . ferrous sulfate  325 mg Oral BID WC  . heparin injection (subcutaneous)  5,000 Units Subcutaneous Q12H  . lactulose  30 g Oral Daily  . melatonin  2.5 mg Oral QHS  . pantoprazole  40 mg Oral Daily  . pravastatin  20 mg Oral q1800  . traZODone  50 mg Oral QHS  . [START ON 06/04/2019] Vitamin D (Ergocalciferol)  50,000 Units Oral Once per day on Sat   Continuous Infusions:      LOS: 3 days      Enzo Bi, MD [facility]  06/02/2019, 7:09 PM

## 2019-06-02 NOTE — Progress Notes (Signed)
[facility]  ROUNDING NOTE   Subjective:   Conversation via [place] interpreter Patient was sent to the emergency room from [name] for evaluation of dialysis She was found to have severe hyponatremia Patient's family member (son) is in the room with her Overall she is improved.  She is able to eat without nausea or vomiting Continues to complain about some sore throat No cough or sputum production No leg edema   Objective:  Vital signs in last 24 hours:  Temp:  [97.9 F (36.6 C)-98.6 F (37 C)] 98.2 F (36.8 C) (05/27 0627) Pulse Rate:  [61-75] 72 (05/27 0627) Resp:  [16-20] 20 (05/27 0627) BP: (146-169)/(48-64) 169/64 (05/27 0627) SpO2:  [99 %-100 %] 99 % (05/27 0627)  Weight change:  Filed Weights   05/30/19 1945  Weight: 69.9 kg    Intake/Output: I/O last 3 completed shifts: In: 1012.9 [P.O.:240; I.V.:772.9] Out: 3700 [Urine:3700]   Intake/Output this shift:  No intake/output data recorded.  Physical Exam: General: No acute distress  Head: Normocephalic, atraumatic. Moist oral mucosal membranes  Eyes: Anicteric  Neck: Supple,   Lungs:  Clear to auscultation, normal effort  Heart: S1S2 no rubs  Abdomen:  Soft, nontender, bowel sounds present  Extremities: Trace peripheral edema.  Neurologic: Awake, alert, following commands  Skin: No lesions  Access: None    Basic Metabolic Panel: Recent Labs  Lab 05/31/19 0802 05/31/19 0802 05/31/19 1412 05/31/19 1412 05/31/19 2000 06/01/19 0500 06/02/19 0521  NA 120*  --  119*  --  121* 124* 127*  K 3.1*  --  3.9  --  4.3 4.1 4.6  CL 84*  --  86*  --  89* 91* 98  CO2 23  --  22  --  22 25 23   GLUCOSE 97  --  141*  --  87 87 79  BUN 68*  --  69*  --  64* 62* 63*  CREATININE 4.29*  --  4.19*  --  4.14* 3.94* 3.55*  CALCIUM 8.0*   < > 7.7*   < > 7.7* 7.9* 7.8*  MG  --   --   --   --   --   --  1.6*   < > = values in this interval not displayed.    Liver Function Tests: No results for  input(s): AST, ALT, ALKPHOS, BILITOT, PROT, ALBUMIN in the last 168 hours. No results for input(s): LIPASE, AMYLASE in the last 168 hours. No results for input(s): AMMONIA in the last 168 hours.  CBC: Recent Labs  Lab 05/30/19 2001 06/01/19 0500 06/02/19 0521  WBC 6.6 5.1 5.4  HGB 10.1* 9.0* 8.7*  HCT 27.6* 25.6* 24.8*  MCV 89.0 92.1 90.8  PLT 260 222 217    Cardiac Enzymes: No results for input(s): CKTOTAL, CKMB, CKMBINDEX, TROPONINI in the last 168 hours.  BNP: Invalid input(s): POCBNP  CBG: Recent Labs  Lab 06/01/19 0810 06/01/19 1144 06/01/19 1635 06/01/19 2117 06/02/19 0759  GLUCAP 90 138* 100* 115* 37    Microbiology: Results for orders placed or performed during the hospital encounter of 05/30/19  SARS Coronavirus 2 by RT PCR (hospital order, performed in [facility] hospital lab) Nasopharyngeal Nasopharyngeal Swab     Status: None   Collection Time: 05/30/19 10:27 PM   Specimen: Nasopharyngeal Swab  Result Value Ref Range Status   SARS Coronavirus 2 NEGATIVE NEGATIVE Final    Comment: (NOTE) SARS-CoV-2 target nucleic acids are NOT DETECTED. The SARS-CoV-2 RNA is generally  detectable in upper and lower respiratory specimens during the acute phase of infection. The lowest concentration of SARS-CoV-2 viral copies this assay can detect is 250 copies / mL. A negative result does not preclude SARS-CoV-2 infection and should not be used as the sole basis for treatment or other patient management decisions.  A negative result may occur with improper specimen collection / handling, submission of specimen other than nasopharyngeal swab, presence of viral mutation(s) within the areas targeted by this assay, and inadequate number of viral copies (<250 copies / mL). A negative result must be combined with clinical observations, patient history, and epidemiological information. Fact Sheet for Patients:   StrictlyIdeas.no Fact Sheet for  Healthcare Providers: BankingDealers.co.za This test is not yet approved or cleared  by the [place] FDA and has been authorized for detection and/or diagnosis of SARS-CoV-2 by FDA under an Emergency Use Authorization (EUA).  This EUA will remain in effect (meaning this test can be used) for the duration of the COVID-19 declaration under Section 564(b)(1) of the Act, 21 U.S.C. section 360bbb-3(b)(1), unless the authorization is terminated or revoked sooner. Performed at [facility], [address] [place], [place] [address]     Coagulation Studies: No results for input(s): LABPROT, INR in the last 72 hours.  Urinalysis: Recent Labs    05/31/19 0802  COLORURINE STRAW*  LABSPEC 1.005  PHURINE 6.0  GLUCOSEU NEGATIVE  HGBUR NEGATIVE  BILIRUBINUR NEGATIVE  KETONESUR NEGATIVE  PROTEINUR 100*  NITRITE NEGATIVE  LEUKOCYTESUR NEGATIVE      Imaging: No results found.   Medications:   . magnesium sulfate bolus IVPB 2 g (06/02/19 0951)   . aspirin EC  81 mg Oral Daily  . ferrous sulfate  325 mg Oral BID WC  . heparin injection (subcutaneous)  5,000 Units Subcutaneous Q12H  . melatonin  2.5 mg Oral QHS  . pantoprazole  40 mg Oral Daily  . pravastatin  20 mg Oral q1800  . traZODone  50 mg Oral QHS  . [START ON 06/04/2019] Vitamin D (Ergocalciferol)  50,000 Units Oral Once per day on Sat   acetaminophen **OR** acetaminophen, labetalol, magnesium hydroxide, ondansetron **OR** ondansetron (ZOFRAN) IV  Assessment/ Plan:  79 y.o. 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  #Severe hyponatremia And hypokalemia Likely secondary to aggressive diuresis Home dose of Lasix 40 mg twice a day-now on hold Continue normal diet and await for  sodium to improve slowly May take Lasix 40 mg once a day as needed only at home for lower extremity edema  #AKI on Chronic kidney disease stage V.   AKI likely secondary to volume depletion During previous admission, nephrology team had a long discussion with the patient about renal replacement therapy.  Patient and her daughter previously decided they did not want to start dialysis. Concept of renal replacement therapy was presented to the patient again.  She has stated that she will do it if absolutely necessary.  However, we would still discuss this with family during the stay. Serum creatinine has slightly improved.  Patient appears to be back to her baseline health status.  No clear uremic symptoms.  No acute indication for dialysis at present.  If discharged, further care may be pursued as outpatient. Lab Results  Component Value Date   CREATININE 3.55 (H) 06/02/2019  CREATININE 3.94 (H) 06/01/2019   CREATININE 4.14 (H) 05/31/2019     #Hyperuricemia Can consider starting low-dose allopurinol 50 mg every other day    LOS: [place] 5/27/202110:10 AM

## 2019-06-02 NOTE — TOC Initial Note (Signed)
Transition of Care [facility]) - Initial/Assessment Note    Patient Details  Name: Robin Rogers MRN: [id number] Date of Birth: 1940-01-16  [facility] CM/SW Contact:    Beverly Sessions, RN Phone Number: 06/02/2019, 2:28 PM  Clinical Narrative:                 Patient admitted from home with hyponatremia  Son at bedside Patient spanish speaking.  Assessment completed with spanish interpreter.   Patient lives at home with her husband.  Children live local for support and provide transportation  PCP [name], uses their pharmacy and denies issues obtaining medications  Patient has been assessed by PT and recommendation is for home health PT.  Patient and family in agreement.  State they do not have a preference of home health agency.   Patient has a cane at home, patient is blind and states that she prefers not to use DME that way she is able to feel where she is going.   Home health referral made to [facility] with [place] for RN and PT.  Patient has a history for CHF.  Confirms she has scales in the home to perform daily weights   Expected Discharge Plan: [place] Barriers to Discharge: Continued Medical Work up   Patient Goals and CMS Choice     Choice offered to / list presented to : Adult Children, Patient  Expected Discharge Plan and Services Expected Discharge Plan: [place]   Discharge Planning Services: CM Consult Post Acute Care Choice: [place] arrangements for the past 2 months: [place] Arranged: RN, PT [place] Agency: [place] ([facility]) Date [place]: 06/02/19   Representative spoke with at [place]: Santiago Glad  Prior Living Arrangements/Services Living arrangements for the past 2 months: [place] Lives with:: Relatives Patient language and need for interpreter reviewed:: Yes Do you feel safe going back to the  place where you live?: Yes      Need for Family Participation in Patient Care: Yes (Comment) Care giver support system in place?: Yes (comment)   Criminal Activity/Legal Involvement Pertinent to Current Situation/Hospitalization: No - Comment as needed  Activities of Daily Living Home Assistive Devices/Equipment: None ADL Screening (condition at time of admission) Patient's cognitive ability adequate to safely complete daily activities?: Yes Is the patient deaf or have difficulty hearing?: No Does the patient have difficulty seeing, even when wearing glasses/contacts?: Yes Does the patient have difficulty concentrating, remembering, or making decisions?: No Patient able to express need for assistance with ADLs?: Yes Does the patient have difficulty dressing or bathing?: Yes Independently performs ADLs?: Yes (appropriate for developmental age) Does the patient have difficulty walking or climbing stairs?: Yes Weakness of Legs: Both Weakness of Arms/Hands: Both  Permission Sought/Granted                  Emotional Assessment Appearance:: Appears stated age     Orientation: : Oriented to Self, Oriented to Place, Oriented to  Time, Oriented to Situation   Psych Involvement: No (comment)  Admission diagnosis:  Hyponatremia [E87.1] Patient Active Problem List   Diagnosis Date Noted  . CKD (chronic kidney disease), stage IV ([place]) 05/31/2019  . Insomnia 05/31/2019  . Generalized weakness 05/31/2019  . Anemia due  to chronic kidney disease 05/31/2019  . Blind in both eyes 05/31/2019  . Hypertensive urgency 05/31/2019  . Hyponatremia 05/30/2019  . ARF (acute renal failure) ([place]) 05/10/2019  . Acute on chronic heart failure with reduced ejection fraction and diastolic dysfunction ([place]) 05/10/2019  . Chronic combined systolic and diastolic CHF (congestive heart failure) ([place]) 10/09/2017  . HTN (hypertension) 10/09/2017  . Type 2 diabetes mellitus with chronic kidney disease and  hypertension ([place]) 10/09/2017  . Intractable nausea and vomiting 09/22/2017   PCP:  [facility] [place]:   [facility] [address], [place] [address] Phone: [phone] Fax: [place], [place] [address] Phone: [phone] Fax: [phone]     Social Determinants of Health (SDOH) Interventions    Readmission Risk Interventions Readmission Risk Prevention Plan 06/02/2019  Transportation Screening Complete  HRI or Home Care Consult Complete  Palliative Care Screening Not Applicable  Medication Review (RN Care Manager) Complete  Some recent data might be hidden

## 2019-06-03 LAB — CBC
HCT: 25.6 % — ABNORMAL LOW (ref 36.0–46.0)
Hemoglobin: 9 g/dL — ABNORMAL LOW (ref 12.0–15.0)
MCH: 32.3 pg (ref 26.0–34.0)
MCHC: 35.2 g/dL (ref 30.0–36.0)
MCV: 91.8 fL (ref 80.0–100.0)
Platelets: 215 10*3/uL (ref 150–400)
RBC: 2.79 MIL/uL — ABNORMAL LOW (ref 3.87–5.11)
RDW: 12.2 % (ref 11.5–15.5)
WBC: 6 10*3/uL (ref 4.0–10.5)
nRBC: 0 % (ref 0.0–0.2)

## 2019-06-03 LAB — GLUCOSE, CAPILLARY: Glucose-Capillary: 78 mg/dL (ref 70–99)

## 2019-06-03 LAB — BASIC METABOLIC PANEL
Anion gap: 4 — ABNORMAL LOW (ref 5–15)
BUN: 55 mg/dL — ABNORMAL HIGH (ref 8–23)
CO2: 23 mmol/L (ref 22–32)
Calcium: 8.5 mg/dL — ABNORMAL LOW (ref 8.9–10.3)
Chloride: 104 mmol/L (ref 98–111)
Creatinine, Ser: 3.48 mg/dL — ABNORMAL HIGH (ref 0.44–1.00)
GFR calc Af Amer: 14 mL/min — ABNORMAL LOW (ref >60)
GFR calc non Af Amer: 12 mL/min — ABNORMAL LOW (ref >60)
Glucose, Bld: 83 mg/dL (ref 70–99)
Potassium: 4.6 mmol/L (ref 3.5–5.1)
Sodium: 131 mmol/L — ABNORMAL LOW (ref 135–145)

## 2019-06-03 LAB — MAGNESIUM: Magnesium: 2.2 mg/dL (ref 1.7–2.4)

## 2019-06-03 MED ORDER — LACTULOSE 10 GM/15ML PO SOLN: 30.0000 g | Freq: Every day | ORAL | 0 refills | Status: AC

## 2019-06-03 MED ORDER — FUROSEMIDE 40 MG PO TABS: ORAL_TABLET | ORAL | 0 refills | Status: DC

## 2019-06-03 NOTE — Progress Notes (Signed)
MD ordered patient to be discharged home.  Discharge instructions were reviewed with the patients daughter and she voiced understanding.  Follow-up appointment was made.  No prescriptions given to the patient.  IV was removed with catheter intact.  All daughters questions were answered.  Patient left via wheelchair escorted by RN.

## 2019-06-03 NOTE — Care Management Important Message (Signed)
Important Message  Patient Details  Name: Robin Rogers MRN: [id number] Date of Birth: 1940/07/20   Medicare Important Message Given:  No  Patient discharged prior to arrival to unit to deliver Medicare IM.     Dannette Barbara 06/03/2019, 12:25 PM

## 2019-06-03 NOTE — Discharge Summary (Signed)
Physician Discharge Summary   Robin Rogers  female DOB: 05-May-1940  HXT:[id number]  PCP: [facility] [place] date: 05/30/2019 Discharge date: 06/03/2019  Admitted From: home Disposition:  Home Daughter updated on the phone about discharge plans.  Home Health: Yes CODE STATUS: Full code  Discharge Instructions    Discharge instructions   Complete by: As directed    We are holding your Lasix.  Please follow up with your primary care doctor to see if/when you need to resume taking it.     Dr. Enzo Bi [facility] Course:  For full details, please see H&P, progress notes, consult notes and ancillary notes.  Briefly,  [name] a79 y.o.Hispanic femalewith a medical history of blindness, type 2 diabetes mellitus CHF, hypertension, stage IV chronic kidney disease and anemia, presented to the ED on 05/30/19 with complaints of of generalized weakness, poor appetite, insomnia and occasional nausea vomiting for about a week.  In the ED, hypertensive 175/108, otherwise normal vitals.  Labs notable for severe hyponatremia (Na 114), hypochloremia (Cl 77), potassium 3.5, renal function stable from earlier this month, chronic stable anemia.  ECG showed 1st degree AV block with prolonged QTc of 490 ms.  Admitted to hospitalist service for further evaluation and management.   Upon chart review, patient was admitted for acute CHF decompensation earlier this month, discharged on 05/16/19 on oral Lasix 40 mg BID.  It appears she was new to taking diuretics at that time.  Sodium was normal at 135 prior to discharge.  Hyponatremia, hypovolemic - present on admission, improved Na 114 on presentation.  Suspect multifactorial with diuretics that were started recently for CHF, renal failure contributing.  Hypotonic, sOsm 268.  Urine osm 176, urine Na 37, urine K 8, urine Cl 31.  TSH, cortisol are normal.  Received 1 L NS bolus on admission f/b MIVF with NS.   Diuretic was held.  Encouraged oral fluid intake.  MIVF was d/c'ed the day prior to discharge, and Na continued to improve, was 131 on the day of discharge.    Hypochloremia - present on admission, resolved Cl 77.  Likely due to diuretics as well. Improved as sodium normalizes.    Hypertensive Urgency - present on admission with BP 175/108.   Resolved.  Appears home regimen was Coreg, Lasix, hydralazine.  Lasix on hold as above.  Coreg and hydralazine continued.   Generalized weakness - present on admission   PT eval recommended home with HHPT  Chronic combined systolic and diastolic CHF  echo on 01/11/53 showed EF 40-45% with grade III diastolic dysfunction.  Held diuretics due to hyponatremia.  Pt remained euvolemic during hospitalization, however, will need to follow up with outpatient PCP or cardiology to determine if/when to resume Lasix.  Hx of Type 2 diabetes mellitus with CKD and HTN  well controlled. Last A1c 5.3% earlier this month.  Appears she is not on medications outpatient.  AKI on Chronic kidney disease stage V AKI likely secondary to volume depletion.  Nephrology consulted during this admission.  Concept of renal replacement therapy was presented to the patient again.  She has stated that she will do it if absolutely necessary.  No acute indication for dialysis at present.  Will follow up with nephrology as outpatient.  Anemia due to chronic kidney disease - Stable.    Insomnia continued home trazodone and melatonin.   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   Discharge Diagnoses:  Principal Problem:   Hyponatremia Active Problems:   Chronic combined systolic and diastolic CHF (congestive heart failure) (HCC)   HTN (hypertension)   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 (HCC)   CKD (chronic kidney disease), stage IV (HCC)   Insomnia   Generalized weakness   Anemia due to chronic kidney disease   Blind in both eyes   Hypertensive  urgency    Discharge Instructions:  Allergies as of 06/03/2019   No Known Allergies     Medication List    TAKE these medications   aspirin 81 MG EC tablet Take 1 tablet (81 mg total) by mouth daily.   carvedilol 6.25 MG tablet Commonly known as: COREG Take 1 tablet (6.25 mg total) by mouth 2 (two) times daily with a meal.   ferrous sulfate 325 (65 FE) MG tablet Take 325 mg by mouth 2 (two) times daily with a meal.   furosemide 40 MG tablet Commonly known as: LASIX Hold until followup with PCP. What changed:   how much to take  how to take this  when to take this  additional instructions   hydrALAZINE 10 MG tablet Commonly known as: APRESOLINE Take 1 tablet (10 mg total) by mouth 3 (three) times daily.   lactulose 10 GM/15ML solution Commonly known as: CHRONULAC Take 45 mLs (30 g total) by mouth daily. Start taking on: Jun 04, 2019   lovastatin 40 MG tablet Commonly known as: MEVACOR Take 1 tablet by mouth at bedtime.   melatonin 1 MG Tabs tablet Take 2 mg by mouth at bedtime.   omeprazole 40 MG capsule Commonly known as: PRILOSEC Take 40 mg by mouth daily.   ondansetron 4 MG disintegrating tablet Commonly known as: ZOFRAN-ODT Take 4 mg by mouth every 8 (eight) hours as needed.   traZODone 50 MG tablet Commonly known as: DESYREL Take 50 mg by mouth at bedtime.   Vitamin D (Ergocalciferol) 1.25 MG (50000 UNIT) Caps capsule Commonly known as: DRISDOL Take 50,000 Units by mouth every 7 (seven) days. [facility])       Follow-up Information    Schedule an appointment as soon as possible for a visit with [facility].   Specialty: General Practice Why: Prior to DC please schedule follow up for 5-7 days from discharge  Contact information: [address] [place] [address] [phone]        Minna Merritts, MD. Go on 06/14/2019.   Specialty: Cardiology Why: 10;30am appointment Contact  information: [place] [address] [phone]           No Known Allergies   The results of significant diagnostics from this hospitalization (including imaging, microbiology, ancillary and laboratory) are listed below for reference.   Consultations:   Procedures/Studies: DG Chest Port 1 View  Result Date: 05/10/2019 CLINICAL DATA:  Shortness of breath. EXAM: PORTABLE CHEST 1 VIEW COMPARISON:  09/25/2017.  09/22/2017. FINDINGS: Severe cardiomegaly again noted. Mild pulmonary venous congestion. Mild interstitial prominence. Small bilateral pleural effusions. Mild CHF cannot be excluded. Bibasilar subsegmental atelectasis. Degenerative changes scoliosis thoracic spine. IMPRESSION: One cardiomegaly. Mild pulmonary venous congestion and mild pulmonary interstitial prominence. Small bilateral pleural effusions. Mild CHF cannot be excluded. 2.  Bibasilar subsegmental atelectasis. Electronically Signed   By: Marcello Moores  Register   On: 05/10/2019 16:06   ECHOCARDIOGRAM COMPLETE  Result Date: 05/11/2019    ECHOCARDIOGRAM REPORT   Patient Name:   SARANN TREGRE  Date of Exam: 05/11/2019 Medical Rec #:  [id number]       Height:       62.0 in Accession #:    [id number]      Weight:       162.0 lb Date of Birth:  08/17/1940       BSA:          1.748 m Patient Age:    11 years        BP:           155/57 mmHg Patient Gender: F               HR:           66 bpm. Exam Location:  [facility] Procedure: 2D Echo, Color Doppler and Cardiac Doppler Indications:     K27.06 CHF-Acute Systolic  History:         Patient has prior history of Echocardiogram examinations. Risk                  Factors:Hypertension and Diabetes.  Sonographer:     Charmayne Sheer RDCS (AE) Referring Phys:  [id number] [place] Diagnosing Phys: Kate Sable MD IMPRESSIONS  1. Left ventricular ejection fraction, by estimation, is 40 to 45%. The left ventricle has mild to moderately decreased function. The left  ventricle demonstrates global hypokinesis. Left ventricular diastolic parameters are consistent with Grade III diastolic dysfunction (restrictive).  2. Right ventricular systolic function is normal. The right ventricular size is normal. There is moderately elevated pulmonary artery systolic pressure.  3. Left atrial size was moderately dilated.  4. The mitral valve is grossly normal. Mild mitral valve regurgitation.  5. The aortic valve is grossly normal. Aortic valve regurgitation is not visualized.  6. The inferior vena cava is dilated in size with <50% respiratory variability, suggesting right atrial pressure of 15 mmHg. FINDINGS  Left Ventricle: Left ventricular ejection fraction, by estimation, is 40 to 45%. The left ventricle has mild to moderately decreased function. The left ventricle demonstrates global hypokinesis. The left ventricular internal cavity size was normal in size. There is no left ventricular hypertrophy. Left ventricular diastolic parameters are consistent with Grade III diastolic dysfunction (restrictive). Right Ventricle: The right ventricular size is normal. No increase in right ventricular wall thickness. Right ventricular systolic function is normal. There is moderately elevated pulmonary artery systolic pressure. The tricuspid regurgitant velocity is 3.16 m/s, and with an assumed right atrial pressure of 15 mmHg, the estimated right ventricular systolic pressure is 15.1 mmHg. Left Atrium: Left atrial size was moderately dilated. Right Atrium: Right atrial size was normal in size. Pericardium: A small pericardial effusion is present. The pericardial effusion is anterior to the right ventricle. The pericardial effusion appears to contain epicardial fat. Mitral Valve: The mitral valve is grossly normal. Mild mitral valve regurgitation. MV peak gradient, 8.9 mmHg. The mean mitral valve gradient is 3.0 mmHg. Tricuspid Valve: The tricuspid valve is normal in structure. Tricuspid valve  regurgitation is not demonstrated. Aortic Valve: The aortic valve is grossly normal. Aortic valve regurgitation is not visualized. Aortic valve mean gradient measures 6.0 mmHg. Aortic valve peak gradient measures 9.6 mmHg. Aortic valve area, by VTI measures 1.60 cm. Pulmonic Valve: The pulmonic valve was normal in structure. Pulmonic valve regurgitation is trivial. Aorta: The aortic root is normal in size and structure. Venous: The inferior vena cava is dilated in size with less than 50% respiratory variability, suggesting right atrial pressure of 15 mmHg. IAS/Shunts: The  interatrial septum appears to be lipomatous. No atrial level shunt detected by color flow Doppler.  LEFT VENTRICLE PLAX 2D LVIDd:         6.49 cm  Diastology LVIDs:         4.58 cm  LV e' lateral:   6.20 cm/s LV PW:         1.10 cm  LV E/e' lateral: 20.8 LV IVS:        0.80 cm  LV e' medial:    5.87 cm/s LVOT diam:     1.90 cm  LV E/e' medial:  22.0 LV SV:         50 LV SV Index:   28 LVOT Area:     2.84 cm  RIGHT VENTRICLE RV Basal diam:  3.60 cm LEFT ATRIUM              Index LA diam:        5.10 cm  2.92 cm/m LA Vol (A2C):   113.0 ml 64.65 ml/m LA Vol (A4C):   80.9 ml  46.28 ml/m LA Biplane Vol: 96.8 ml  55.38 ml/m  AORTIC VALVE                    PULMONIC VALVE AV Area (Vmax):    1.41 cm     PV Vmax:       1.15 m/s AV Area (Vmean):   1.17 cm     PV Vmean:      80.900 cm/s AV Area (VTI):     1.60 cm     PV VTI:        0.267 m AV Vmax:           155.00 cm/s  PV Peak grad:  5.3 mmHg AV Vmean:          117.000 cm/s PV Mean grad:  3.0 mmHg AV VTI:            0.311 m AV Peak Grad:      9.6 mmHg AV Mean Grad:      6.0 mmHg LVOT Vmax:         76.90 cm/s LVOT Vmean:        48.300 cm/s LVOT VTI:          0.175 m LVOT/AV VTI ratio: 0.56  AORTA Ao Root diam: 3.00 cm MITRAL VALVE                TRICUSPID VALVE MV Area (PHT): 4.89 cm     TR Peak grad:   39.9 mmHg MV Peak grad:  8.9 mmHg     TR Vmax:        316.00 cm/s MV Mean grad:  3.0 mmHg MV  Vmax:       1.49 m/s     SHUNTS MV Vmean:      85.0 cm/s    Systemic VTI:  0.18 m MV Decel Time: 155 msec     Systemic Diam: 1.90 cm MV E velocity: 129.00 cm/s MV A velocity: 56.80 cm/s MV E/A ratio:  2.27 Kate Sable MD Electronically signed by Kate Sable MD Signature Date/Time: 05/11/2019/4:45:12 PM    Final       Labs: BNP (last 3 results) Recent Labs    05/10/19 1605  BNP 263.7*   Basic Metabolic Panel: Recent Labs  Lab 05/31/19 1412 05/31/19 2000 06/01/19 0500 06/02/19 0521 06/03/19 0427  NA 119* 121* 124* 127* 131*  K 3.9 4.3 4.1 4.6 4.6  CL 86* 89* 91* 98 104  CO2 22 22 25 23 23   GLUCOSE 141* 87 87 79 83  BUN 69* 64* 62* 63* 55*  CREATININE 4.19* 4.14* 3.94* 3.55* 3.48*  CALCIUM 7.7* 7.7* 7.9* 7.8* 8.5*  MG  --   --   --  1.6* 2.2   Liver Function Tests: No results for input(s): AST, ALT, ALKPHOS, BILITOT, PROT, ALBUMIN in the last 168 hours. No results for input(s): LIPASE, AMYLASE in the last 168 hours. No results for input(s): AMMONIA in the last 168 hours. CBC: Recent Labs  Lab 05/30/19 2001 06/01/19 0500 06/02/19 0521 06/03/19 0427  WBC 6.6 5.1 5.4 6.0  HGB 10.1* 9.0* 8.7* 9.0*  HCT 27.6* 25.6* 24.8* 25.6*  MCV 89.0 92.1 90.8 91.8  PLT 260 222 217 215   Cardiac Enzymes: No results for input(s): CKTOTAL, CKMB, CKMBINDEX, TROPONINI in the last 168 hours. BNP: Invalid input(s): POCBNP CBG: Recent Labs  Lab 06/02/19 0759 06/02/19 1142 06/02/19 1652 06/02/19 2108 06/03/19 0752  GLUCAP 87 98 85 102* 78   D-Dimer No results for input(s): DDIMER in the last 72 hours. Hgb A1c No results for input(s): HGBA1C in the last 72 hours. Lipid Profile No results for input(s): CHOL, HDL, LDLCALC, TRIG, CHOLHDL, LDLDIRECT in the last 72 hours. Thyroid function studies No results for input(s): TSH, T4TOTAL, T3FREE, THYROIDAB in the last 72 hours.  Invalid input(s): FREET3 Anemia work up No results for input(s): VITAMINB12, FOLATE, FERRITIN,  TIBC, IRON, RETICCTPCT in the last 72 hours. Urinalysis    Component Value Date/Time   COLORURINE STRAW (A) 05/31/2019 0802   APPEARANCEUR CLEAR (A) 05/31/2019 0802   LABSPEC 1.005 05/31/2019 0802   PHURINE 6.0 05/31/2019 0802   GLUCOSEU NEGATIVE 05/31/2019 0802   HGBUR NEGATIVE 05/31/2019 0802   BILIRUBINUR NEGATIVE 05/31/2019 0802   KETONESUR NEGATIVE 05/31/2019 0802   PROTEINUR 100 (A) 05/31/2019 0802   NITRITE NEGATIVE 05/31/2019 0802   LEUKOCYTESUR NEGATIVE 05/31/2019 0802   Sepsis Labs Invalid input(s): PROCALCITONIN,  WBC,  LACTICIDVEN Microbiology Recent Results (from the past 240 hour(s))  SARS Coronavirus 2 by RT PCR (hospital order, performed in [place] hospital lab) Nasopharyngeal Nasopharyngeal Swab     Status: None   Collection Time: 05/30/19 10:27 PM   Specimen: Nasopharyngeal Swab  Result Value Ref Range Status   SARS Coronavirus 2 NEGATIVE NEGATIVE Final    Comment: (NOTE) SARS-CoV-2 target nucleic acids are NOT DETECTED. The SARS-CoV-2 RNA is generally detectable in upper and lower respiratory specimens during the acute phase of infection. The lowest concentration of SARS-CoV-2 viral copies this assay can detect is 250 copies / mL. A negative result does not preclude SARS-CoV-2 infection and should not be used as the sole basis for treatment or other patient management decisions.  A negative result may occur with improper specimen collection / handling, submission of specimen other than nasopharyngeal swab, presence of viral mutation(s) within the areas targeted by this assay, and inadequate number of viral copies (<250 copies / mL). A negative result must be combined with clinical observations, patient history, and epidemiological information. Fact Sheet for Patients:   StrictlyIdeas.no Fact Sheet for Healthcare Providers: BankingDealers.co.za This test is not yet approved or cleared  by the [place]  FDA and has been authorized for detection and/or diagnosis of SARS-CoV-2 by FDA under an Emergency Use Authorization (EUA).  This EUA will remain in effect (meaning this test can be used) for the duration of the COVID-19 declaration under Section 564(b)(1)  of the Act, 21 U.S.C. section 360bbb-3(b)(1), unless the authorization is terminated or revoked sooner. Performed at [facility], [address] [place], [place] [address]      Total time spend on discharging this patient, including the last patient exam, discussing the hospital stay, instructions for ongoing care as it relates to all pertinent caregivers, as well as preparing the medical discharge records, prescriptions, and/or referrals as applicable, is 40 minutes.    Enzo Bi, MD  [facility] 06/03/2019, 11:59 AM  If 7PM-7AM, please contact night-coverage

## 2019-06-13 NOTE — Progress Notes (Deleted)
Office Visit    Patient Name: Robin Rogers Date of Encounter: 06/13/2019  Primary Care Provider:  [facility] [place] Primary Cardiologist:  Ida Rogue, MD Electrophysiologist:  None   Chief Complaint    Robin Rogers is a 79 y.o. female with a hx of DM 2, HTN, CKD, blindness, chronic congestive heart failure, hyponatremia presents today for hospital follow-up  Past Medical History    Past Medical History:  Diagnosis Date  . Anemia of chronic disease   . Blindness   . Chronic combined systolic (congestive) and diastolic (congestive) heart failure ([place])    a. 09/2017 Echo: EF 45-50%, Gr2 DD; b. 05/2019 Echo: EF 40-45%, Gr III DD (restrictive), nl RV fxn, mod elev PASP. Mod dil LA. Mild MR.  . CKD (chronic kidney disease), stage IV ([place])   . Diet-controlled diabetes mellitus ([place])   . Hypertension    Past Surgical History:  Procedure Laterality Date  . CHOLECYSTECTOMY    . ESOPHAGOGASTRODUODENOSCOPY N/A 09/24/2017   Procedure: ESOPHAGOGASTRODUODENOSCOPY (EGD);  Surgeon: Toledo, Benay Pike, MD;  Location: [facility] ENDOSCOPY;  Service: Gastroenterology;  Laterality: N/A;    Allergies  No Known Allergies  History of Present Illness    Robin Rogers is a 79 y.o. female with a hx of DM 2, HTN, CKDIV, anemia, blindness, chronic congestive heart failure, hyponatremia.  She was last seen by Darylene Price, NP 05/19/2019.  Echo 09/25/2017 LVEF 45 to 60%, mild AI, moderate MR.  Admitted 05/10/2019 due to acute on chronic heart failure.  Echo 05/11/19 LVEF 40-45%, moderately elevated PASP, mild MR.  Seen seen by Darylene Price, NP in clinic, no changes made on 05/19/2027. Seen by nephrology 05/30/19 with recommendaiton to resume Amlodipine 10mg  daily and referral made to [facility] to have catheter placed for dialysis. Readmitted 05/30/2019 -05/26/2019 after presenting to the ED with complaints of weakness, poor appetite, insomnia with labs notable for  severe hyponatremia (na114) and hypochloremia (Cl 77). Lasix held on discharge. HH PT Ordered.    ***  EKGs/Labs/Other Studies Reviewed:   The following studies were reviewed today: ***  EKG:  EKG is *** ordered today.  The ekg ordered today demonstrates ***  Recent Labs: 05/10/2019: B Natriuretic Peptide 702.0 05/31/2019: TSH 0.781 06/03/2019: BUN 55; Creatinine, Ser 3.48; Hemoglobin 9.0; Magnesium 2.2; Platelets 215; Potassium 4.6; Sodium 131  Recent Lipid Panel No results found for: CHOL, TRIG, HDL, CHOLHDL, VLDL, LDLCALC, LDLDIRECT  Home Medications   No outpatient medications have been marked as taking for the 06/14/19 encounter (Appointment) with Loel Dubonnet, NP.      Review of Systems    ***   ROS All other systems reviewed and are otherwise negative except as noted above.  Physical Exam    VS:  There were no vitals taken for this visit. , BMI There is no height or weight on file to calculate BMI. GEN: Well nourished, well developed, in no acute distress. HEENT: normal. Neck: Supple, no JVD, carotid bruits, or masses. Cardiac: ***RRR, no murmurs, rubs, or gallops. No clubbing, cyanosis, edema.  ***Radials/DP/PT 2+ and equal bilaterally.  Respiratory:  ***Respirations regular and unlabored, clear to auscultation bilaterally. GI: Soft, nontender, nondistended, BS + x 4. MS: No deformity or atrophy. Skin: Warm and dry, no rash. Neuro:  Strength and sensation are intact. Psych: Normal affect.  Assessment & Plan    1. HFrEF -  2. HTN -  3. HLD -  4. CKD -  5. Anemia -  6. Hyponatremia  -  Disposition: Follow up {follow up:[id number]} with ***   Loel Dubonnet, NP 06/13/2019, 8:01 PM

## 2019-06-14 ENCOUNTER — Ambulatory Visit: Payer: Medicare Other | Admitting: Family

## 2019-06-15 ENCOUNTER — Encounter: Payer: Self-pay | Admitting: Family

## 2019-07-22 ENCOUNTER — Emergency Department: Payer: Medicare Other

## 2019-07-22 ENCOUNTER — Inpatient Hospital Stay
Admission: EM | Admit: 2019-07-22 | Discharge: 2019-07-27 | DRG: 640 | Disposition: A | Discharge status: Home / Self Care | Payer: Medicare Other | Attending: Internal Medicine | Admitting: Internal Medicine

## 2019-07-22 ENCOUNTER — Other Ambulatory Visit: Payer: Self-pay

## 2019-07-22 DIAGNOSIS — Z79899 Other long term (current) drug therapy: Secondary | ICD-10-CM

## 2019-07-22 DIAGNOSIS — I132 Hypertensive heart and chronic kidney disease with heart failure and with stage 5 chronic kidney disease, or end stage renal disease: Secondary | ICD-10-CM | POA: Diagnosis present

## 2019-07-22 DIAGNOSIS — E8770 Fluid overload, unspecified: Principal | ICD-10-CM | POA: Diagnosis present

## 2019-07-22 DIAGNOSIS — K219 Gastro-esophageal reflux disease without esophagitis: Secondary | ICD-10-CM | POA: Diagnosis present

## 2019-07-22 DIAGNOSIS — E669 Obesity, unspecified: Secondary | ICD-10-CM | POA: Diagnosis present

## 2019-07-22 DIAGNOSIS — R296 Repeated falls: Secondary | ICD-10-CM | POA: Diagnosis present

## 2019-07-22 DIAGNOSIS — M25522 Pain in left elbow: Secondary | ICD-10-CM | POA: Diagnosis present

## 2019-07-22 DIAGNOSIS — E1122 Type 2 diabetes mellitus with diabetic chronic kidney disease: Secondary | ICD-10-CM | POA: Diagnosis present

## 2019-07-22 DIAGNOSIS — D649 Anemia, unspecified: Secondary | ICD-10-CM | POA: Diagnosis not present

## 2019-07-22 DIAGNOSIS — Z111 Encounter for screening for respiratory tuberculosis: Secondary | ICD-10-CM

## 2019-07-22 DIAGNOSIS — I5023 Acute on chronic systolic (congestive) heart failure: Secondary | ICD-10-CM | POA: Diagnosis present

## 2019-07-22 DIAGNOSIS — E785 Hyperlipidemia, unspecified: Secondary | ICD-10-CM | POA: Diagnosis present

## 2019-07-22 DIAGNOSIS — D631 Anemia in chronic kidney disease: Secondary | ICD-10-CM | POA: Diagnosis present

## 2019-07-22 DIAGNOSIS — R110 Nausea: Secondary | ICD-10-CM | POA: Diagnosis present

## 2019-07-22 DIAGNOSIS — E871 Hypo-osmolality and hyponatremia: Secondary | ICD-10-CM | POA: Diagnosis present

## 2019-07-22 DIAGNOSIS — N186 End stage renal disease: Secondary | ICD-10-CM | POA: Diagnosis present

## 2019-07-22 DIAGNOSIS — N185 Chronic kidney disease, stage 5: Secondary | ICD-10-CM | POA: Diagnosis not present

## 2019-07-22 DIAGNOSIS — Z20822 Contact with and (suspected) exposure to covid-19: Secondary | ICD-10-CM | POA: Diagnosis present

## 2019-07-22 DIAGNOSIS — Z7189 Other specified counseling: Secondary | ICD-10-CM | POA: Diagnosis not present

## 2019-07-22 DIAGNOSIS — Z992 Dependence on renal dialysis: Secondary | ICD-10-CM | POA: Diagnosis not present

## 2019-07-22 DIAGNOSIS — Z7982 Long term (current) use of aspirin: Secondary | ICD-10-CM

## 2019-07-22 DIAGNOSIS — R531 Weakness: Secondary | ICD-10-CM | POA: Diagnosis not present

## 2019-07-22 DIAGNOSIS — H547 Unspecified visual loss: Secondary | ICD-10-CM | POA: Diagnosis present

## 2019-07-22 DIAGNOSIS — N2581 Secondary hyperparathyroidism of renal origin: Secondary | ICD-10-CM | POA: Diagnosis present

## 2019-07-22 DIAGNOSIS — Z515 Encounter for palliative care: Secondary | ICD-10-CM

## 2019-07-22 DIAGNOSIS — Z6828 Body mass index (BMI) 28.0-28.9, adult: Secondary | ICD-10-CM

## 2019-07-22 LAB — URINALYSIS, COMPLETE (UACMP) WITH MICROSCOPIC
Bilirubin Urine: NEGATIVE
Glucose, UA: 50 mg/dL — AB
Hgb urine dipstick: NEGATIVE
Ketones, ur: NEGATIVE mg/dL
Leukocytes,Ua: NEGATIVE
Nitrite: NEGATIVE
Protein, ur: >=300 mg/dL — AB
Specific Gravity, Urine: 1.008 (ref 1.005–1.030)
pH: 5 (ref 5.0–8.0)

## 2019-07-22 LAB — BASIC METABOLIC PANEL
Anion gap: 8 (ref 5–15)
BUN: 50 mg/dL — ABNORMAL HIGH (ref 8–23)
CO2: 23 mmol/L (ref 22–32)
Calcium: 7.6 mg/dL — ABNORMAL LOW (ref 8.9–10.3)
Chloride: 89 mmol/L — ABNORMAL LOW (ref 98–111)
Creatinine, Ser: 4.66 mg/dL — ABNORMAL HIGH (ref 0.44–1.00)
GFR calc Af Amer: 10 mL/min — ABNORMAL LOW (ref >60)
GFR calc non Af Amer: 8 mL/min — ABNORMAL LOW (ref >60)
Glucose, Bld: 186 mg/dL — ABNORMAL HIGH (ref 70–99)
Potassium: 4.5 mmol/L (ref 3.5–5.1)
Sodium: 120 mmol/L — ABNORMAL LOW (ref 135–145)

## 2019-07-22 LAB — CBC
HCT: 22.8 % — ABNORMAL LOW (ref 36.0–46.0)
Hemoglobin: 7.5 g/dL — ABNORMAL LOW (ref 12.0–15.0)
MCH: 31.4 pg (ref 26.0–34.0)
MCHC: 32.9 g/dL (ref 30.0–36.0)
MCV: 95.4 fL (ref 80.0–100.0)
Platelets: 205 10*3/uL (ref 150–400)
RBC: 2.39 MIL/uL — ABNORMAL LOW (ref 3.87–5.11)
RDW: 12.9 % (ref 11.5–15.5)
WBC: 4.8 10*3/uL (ref 4.0–10.5)
nRBC: 0 % (ref 0.0–0.2)

## 2019-07-22 LAB — SARS CORONAVIRUS 2 BY RT PCR (HOSPITAL ORDER, PERFORMED IN ~~LOC~~ HOSPITAL LAB): SARS Coronavirus 2: NEGATIVE

## 2019-07-22 LAB — GLUCOSE, CAPILLARY
Glucose-Capillary: 125 mg/dL — ABNORMAL HIGH (ref 70–99)
Glucose-Capillary: 82 mg/dL (ref 70–99)

## 2019-07-22 LAB — SODIUM
Sodium: 123 mmol/L — ABNORMAL LOW (ref 135–145)
Sodium: 122 mmol/L — ABNORMAL LOW (ref 135–145)

## 2019-07-22 MED ORDER — ACETAMINOPHEN 650 MG RE SUPP: 650.0000 mg | Freq: Four times a day (QID) | RECTAL | Status: DC | PRN

## 2019-07-22 MED ORDER — BISACODYL 5 MG PO TBEC
5.0000 mg | DELAYED_RELEASE_TABLET | Freq: Every day | ORAL | Status: DC | PRN
  Administered 2019-07-23 – 2019-07-24 (×2): 5 mg via ORAL
  Filled 2019-07-22 (×2): qty 1

## 2019-07-22 MED ORDER — PRAVASTATIN SODIUM 20 MG PO TABS
40.0000 mg | ORAL_TABLET | Freq: Every day | ORAL | Status: DC
  Administered 2019-07-22 – 2019-07-26 (×5): 40 mg via ORAL
  Filled 2019-07-22 (×5): qty 2
  Filled 2019-07-22: qty 1

## 2019-07-22 MED ORDER — HYDRALAZINE HCL 10 MG PO TABS
10.0000 mg | ORAL_TABLET | Freq: Three times a day (TID) | ORAL | Status: DC
  Administered 2019-07-22 – 2019-07-27 (×11): 10 mg via ORAL
  Filled 2019-07-22 (×18): qty 1

## 2019-07-22 MED ORDER — MORPHINE SULFATE (PF) 2 MG/ML IV SOLN
1.0000 mg | INTRAVENOUS | Status: DC | PRN
  Administered 2019-07-25 – 2019-07-27 (×2): 1 mg via INTRAVENOUS
  Filled 2019-07-22 (×2): qty 1

## 2019-07-22 MED ORDER — ONDANSETRON HCL 4 MG PO TABS
4.0000 mg | ORAL_TABLET | Freq: Four times a day (QID) | ORAL | Status: DC | PRN
  Administered 2019-07-26: 21:00:00 4 mg via ORAL
  Filled 2019-07-22: qty 1

## 2019-07-22 MED ORDER — INSULIN ASPART 100 UNIT/ML ~~LOC~~ SOLN
0.0000 [IU] | Freq: Three times a day (TID) | SUBCUTANEOUS | Status: DC
  Administered 2019-07-22 – 2019-07-24 (×4): 1 [IU] via SUBCUTANEOUS
  Filled 2019-07-22 (×3): qty 1

## 2019-07-22 MED ORDER — FERROUS SULFATE 325 (65 FE) MG PO TABS
325.0000 mg | ORAL_TABLET | Freq: Two times a day (BID) | ORAL | Status: DC
  Administered 2019-07-22 – 2019-07-26 (×6): 325 mg via ORAL
  Filled 2019-07-22 (×8): qty 1

## 2019-07-22 MED ORDER — SODIUM CHLORIDE 0.9% FLUSH
3.0000 mL | Freq: Two times a day (BID) | INTRAVENOUS | Status: DC
  Administered 2019-07-23 – 2019-07-26 (×7): 3 mL via INTRAVENOUS

## 2019-07-22 MED ORDER — CARVEDILOL 3.125 MG PO TABS
6.2500 mg | ORAL_TABLET | Freq: Two times a day (BID) | ORAL | Status: DC
  Administered 2019-07-22 – 2019-07-26 (×7): 6.25 mg via ORAL
  Filled 2019-07-22 (×8): qty 2

## 2019-07-22 MED ORDER — SODIUM CHLORIDE 0.9 % IV BOLUS
1000.0000 mL | Freq: Once | INTRAVENOUS | Status: AC
  Administered 2019-07-22: 1000 mL via INTRAVENOUS

## 2019-07-22 MED ORDER — PANTOPRAZOLE SODIUM 40 MG PO TBEC
40.0000 mg | DELAYED_RELEASE_TABLET | Freq: Every day | ORAL | Status: DC
  Administered 2019-07-22 – 2019-07-27 (×5): 40 mg via ORAL
  Filled 2019-07-22 (×5): qty 1

## 2019-07-22 MED ORDER — ACETAMINOPHEN 325 MG PO TABS
650.0000 mg | ORAL_TABLET | Freq: Four times a day (QID) | ORAL | Status: DC | PRN
  Administered 2019-07-23 – 2019-07-24 (×3): 650 mg via ORAL
  Filled 2019-07-22 (×2): qty 2

## 2019-07-22 MED ORDER — SODIUM CHLORIDE 0.9% FLUSH: 3.0000 mL | Freq: Once | INTRAVENOUS | Status: DC

## 2019-07-22 MED ORDER — HEPARIN SODIUM (PORCINE) 5000 UNIT/ML IJ SOLN
5000.0000 [IU] | Freq: Three times a day (TID) | INTRAMUSCULAR | Status: DC
  Administered 2019-07-22 – 2019-07-27 (×11): 5000 [IU] via SUBCUTANEOUS
  Filled 2019-07-22 (×12): qty 1

## 2019-07-22 MED ORDER — ONDANSETRON HCL 4 MG/2ML IJ SOLN
4.0000 mg | Freq: Four times a day (QID) | INTRAMUSCULAR | Status: DC | PRN
  Administered 2019-07-26: 16:00:00 4 mg via INTRAVENOUS
  Filled 2019-07-22: qty 2

## 2019-07-22 MED ORDER — ASPIRIN EC 81 MG PO TBEC
81.0000 mg | DELAYED_RELEASE_TABLET | Freq: Every day | ORAL | Status: DC
  Administered 2019-07-22 – 2019-07-27 (×5): 81 mg via ORAL
  Filled 2019-07-22 (×5): qty 1

## 2019-07-22 MED ORDER — SODIUM CHLORIDE 0.9 % IV SOLN: INTRAVENOUS | Status: DC

## 2019-07-22 MED ORDER — TRAMADOL HCL 50 MG PO TABS
50.0000 mg | ORAL_TABLET | Freq: Four times a day (QID) | ORAL | Status: DC | PRN
  Administered 2019-07-25: 50 mg via ORAL
  Filled 2019-07-22 (×2): qty 1

## 2019-07-22 NOTE — ED Provider Notes (Signed)
[facility] Emergency Department Provider Note  ____________________________________________  Time seen: Approximately 3:45 PM  I have reviewed the triage vital signs and the nursing notes.   HISTORY  Chief Complaint Weakness  encounter completed with Spanish interpreter Robin Rogers at bedside  HPI Robin Rogers is a 79 y.o. female with a history of blindness, CHF, CKD, hypertension, anemia of chronic disease who comes the ED complaining of generalized weakness decreased appetite and not eating for the past week. Also has been having falls, hit her left side at the elbow and hip. No dysuria, diarrhea, constipation. No vomiting. Compliant with her medications, no changes in medication regimen. Symptoms are constant, no aggravating or alleviating factors. Pain in the left elbow and left hip is nonradiating. Also complains of left lower quadrant abdominal pain which is nonradiating.  Had a previous hospitalization for weakness and hyponatremia.      Past Medical History:  Diagnosis Date  . Anemia of chronic disease   . Blindness   . Chronic combined systolic (congestive) and diastolic (congestive) heart failure ([place])    a. 09/2017 Echo: EF 45-50%, Gr2 DD; b. 05/2019 Echo: EF 40-45%, Gr III DD (restrictive), nl RV fxn, mod elev PASP. Mod dil LA. Mild MR.  . CKD (chronic kidney disease), stage IV ([place])   . Diet-controlled diabetes mellitus ([place])   . Hypertension      Patient Active Problem List   Diagnosis Date Noted  . CKD (chronic kidney disease), stage IV ([place]) 05/31/2019  . Insomnia 05/31/2019  . Generalized weakness 05/31/2019  . Anemia due to chronic kidney disease 05/31/2019  . Blind in both eyes 05/31/2019  . Hypertensive urgency 05/31/2019  . Hyponatremia 05/30/2019  . ARF (acute renal failure) ([place]) 05/10/2019  . Acute on chronic heart failure with reduced ejection fraction and diastolic dysfunction ([place]) 05/10/2019  . Chronic combined  systolic and diastolic CHF (congestive heart failure) ([place]) 10/09/2017  . HTN (hypertension) 10/09/2017  . 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 Intractable nausea and vomiting 09/22/2017     Past Surgical History:  Procedure Laterality Date  . CHOLECYSTECTOMY    . ESOPHAGOGASTRODUODENOSCOPY N/A 09/24/2017   Procedure: ESOPHAGOGASTRODUODENOSCOPY (EGD);  Surgeon: Toledo, Benay Pike, MD;  Location: [facility] ENDOSCOPY;  Service: Gastroenterology;  Laterality: N/A;     Prior to Admission medications   Medication Sig Start Date End Date Taking? Authorizing Provider  aspirin EC 81 MG EC tablet Take 1 tablet (81 mg total) by mouth daily. 05/16/19   Swayze, Ava, DO  carvedilol (COREG) 6.25 MG tablet Take 1 tablet (6.25 mg total) by mouth 2 (two) times daily with a meal. 05/16/19   Swayze, Ava, DO  ferrous sulfate 325 (65 FE) MG tablet Take 325 mg by mouth 2 (two) times daily with a meal.    [provider]  furosemide (LASIX) 40 MG tablet Hold until followup with PCP. 06/03/19   Enzo Bi, MD  hydrALAZINE (APRESOLINE) 10 MG tablet Take 1 tablet (10 mg total) by mouth 3 (three) times daily. 05/16/19   Swayze, Ava, DO  lovastatin (MEVACOR) 40 MG tablet Take 1 tablet by mouth at bedtime.  01/21/17   [provider]  melatonin 1 MG TABS tablet Take 2 mg by mouth at bedtime.    [provider]  omeprazole (PRILOSEC) 40 MG capsule Take 40 mg by mouth daily.    [provider]  ondansetron (ZOFRAN-ODT) 4 MG disintegrating tablet Take 4 mg  by mouth every 8 (eight) hours as needed. 05/25/19   [provider]  traZODone (DESYREL) 50 MG tablet Take 50 mg by mouth at bedtime. 05/25/19   [provider]  Vitamin D, Ergocalciferol, (DRISDOL) 1.25 MG (50000 UNIT) CAPS capsule Take 50,000 Units by mouth every 7 (seven) days. [facility])    [provider]     Allergies Patient has no known allergies.   No  family history on file.  Social History Social History   Tobacco Use  . Smoking status: Never Smoker  . Smokeless tobacco: Never Used  Vaping Use  . Vaping Use: Never used  Substance Use Topics  . Alcohol use: No  . Drug use: Never    Review of Systems  Constitutional:   No fever or chills.  ENT:   No sore throat. No rhinorrhea. Cardiovascular:   No chest pain or syncope. Respiratory:   No dyspnea or cough. Gastrointestinal: Positive abdominal pain without vomiting and diarrhea.  Musculoskeletal:   Left hip pain and left elbow pain. All other systems reviewed and are negative except as documented above in ROS and HPI.  ____________________________________________   PHYSICAL EXAM:  VITAL SIGNS: ED Triage Vitals  Enc Vitals Group     BP 07/22/19 1201 (!) 145/56     Pulse Rate 07/22/19 1201 71     Resp 07/22/19 1201 18     Temp 07/22/19 1201 98.4 F (36.9 C)     Temp src --      SpO2 07/22/19 1201 99 %     Weight 07/22/19 1205 150 lb (68 kg)     Height 07/22/19 1205 5\' 2"  (1.575 m)     Head Circumference --      Peak Flow --      Pain Score 07/22/19 1151 0     Pain Loc --      Pain Edu? --      Excl. in [place]? --     Vital signs reviewed, nursing assessments reviewed.   Constitutional:   Alert and oriented. Non-toxic appearance. Eyes:   Conjunctivae are normal. EOMI. Chronic blindness. ENT      Head:   Normocephalic and atraumatic.      Nose:   Normal.      Mouth/Throat: Dry mucous membranes      Neck:   No meningismus. Full ROM. Hematological/Lymphatic/Immunilogical:   No cervical lymphadenopathy. Cardiovascular:   RRR. Symmetric bilateral radial and DP pulses.  No murmurs. Cap refill less than 2 seconds. Respiratory:   Normal respiratory effort without tachypnea/retractions. Breath sounds are clear and equal bilaterally. No wheezes/rales/rhonchi. Gastrointestinal:   Soft with generalized tenderness, worse on the left side. Non distended. There is no CVA  tenderness.  No rebound, rigidity, or guarding. Rectal exam with brown stool, Hemoccult negative Musculoskeletal:   Normal range of motion in all extremities. No joint effusions.  No lower extremity tenderness.  No edema. No bony point tenderness. Neurologic:   Normal speech and language.  Motor grossly intact. No acute focal neurologic deficits are appreciated.  Skin:    Skin is warm, dry and intact. There is ecchymosis about the left elbow without open wounds. No petechiae, purpura, or bullae.  ____________________________________________    LABS (pertinent positives/negatives) (all labs ordered are listed, but only abnormal results are displayed) Labs Reviewed  BASIC METABOLIC PANEL - Abnormal; Notable for the following components:      Result Value   Sodium 120 (*)    Chloride 89 (*)  Glucose, Bld 186 (*)    BUN 50 (*)    Creatinine, Ser 4.66 (*)    Calcium 7.6 (*)    GFR calc non Af Amer 8 (*)    GFR calc Af Amer 10 (*)    All other components within normal limits  CBC - Abnormal; Notable for the following components:   RBC 2.39 (*)    Hemoglobin 7.5 (*)    HCT 22.8 (*)    All other components within normal limits  URINALYSIS, COMPLETE (UACMP) WITH MICROSCOPIC - Abnormal; Notable for the following components:   Color, Urine YELLOW (*)    APPearance HAZY (*)    Glucose, UA 50 (*)    Protein, ur >=300 (*)    Bacteria, UA RARE (*)    All other components within normal limits  SARS CORONAVIRUS 2 BY RT PCR (HOSPITAL ORDER, [place] [facility])   ____________________________________________   EKG  Interpreted by me atrial fibrillation, rate of 76. Normal axis and intervals. Normal QRS ST segments and T waves. No ischemic changes  ____________________________________________    RADIOLOGY  CT ABDOMEN PELVIS WO CONTRAST  Result Date: 07/22/2019 CLINICAL DATA:  Abdominal pain, weakness, anorexia, fell yesterday EXAM: CT ABDOMEN AND PELVIS WITHOUT  CONTRAST TECHNIQUE: Multidetector CT imaging of the abdomen and pelvis was performed following the standard protocol without IV contrast. COMPARISON:  09/22/2017 FINDINGS: Lower chest: There is chronic bilateral pleural thickening, with pleural calcification at the right costophrenic angle. Stable small pericardial effusion. Hepatobiliary: No focal liver abnormality is seen. Status post cholecystectomy. No biliary dilatation. Pancreas: Unremarkable. No pancreatic ductal dilatation or surrounding inflammatory changes. Spleen: Normal in size without focal abnormality. Adrenals/Urinary Tract: No urinary tract calculi or obstructive uropathy. There is bilateral renal cortical atrophy. Bladder is unremarkable. Stable adrenal glands. Stomach/Bowel: No bowel obstruction or ileus. Normal appendix right lower quadrant. No bowel wall thickening or inflammatory change. Vascular/Lymphatic: Aortic atherosclerosis. No enlarged abdominal or pelvic lymph nodes. Reproductive: Uterus and bilateral adnexa are unremarkable. Other: There is diffuse body wall edema greatest in the bilateral flanks. No free fluid or free gas within the peritoneal cavity. No abdominal wall hernia. Musculoskeletal: No acute or destructive bony lesions. Reconstructed images demonstrate no additional findings. IMPRESSION: 1. No acute intra-abdominal or intrapelvic process. 2. Diffuse body wall edema. 3. Aortic Atherosclerosis (ICD10-I70.0). Electronically Signed   By: Randa Ngo M.D.   On: 07/22/2019 15:27    ____________________________________________   PROCEDURES .Critical Care Performed by: Carrie Mew, MD Authorized by: Carrie Mew, MD   Critical care provider statement:    Critical care time (minutes):  35   Critical care time was exclusive of:  Separately billable procedures and treating other patients   Critical care was necessary to treat or prevent imminent or life-threatening deterioration of the following conditions:   CNS failure or compromise, metabolic crisis and renal failure   Critical care was time spent personally by me on the following activities:  Development of treatment plan with patient or surrogate, discussions with consultants, evaluation of patient's response to treatment, examination of patient, obtaining history from patient or surrogate, ordering and performing treatments and interventions, ordering and review of laboratory studies, ordering and review of radiographic studies, pulse oximetry, re-evaluation of patient's condition and review of old charts    ____________________________________________  DIFFERENTIAL DIAGNOSIS   diverticulitis, bowel obstruction, hernia, dehydration, electrolyte abnormality, UTI  CLINICAL IMPRESSION / ASSESSMENT AND PLAN / ED COURSE  Medications ordered in the ED: Medications  sodium chloride flush (NS) 0.9 % injection 3 mL (0 mLs Intravenous Hold 07/22/19 1536)  sodium chloride 0.9 % bolus 1,000 mL (1,000 mLs Intravenous New Bag/Given 07/22/19 1456)    Pertinent labs & imaging results that were available during my care of the patient were reviewed by me and considered in my medical decision making (see chart for details).  Robin Rogers was evaluated in Emergency Department on 07/22/2019 for the symptoms described in the history of present illness. She was evaluated in the context of the global COVID-19 pandemic, which necessitated consideration that the patient might be at risk for infection with the SARS-CoV-2 virus that causes COVID-19. Institutional protocols and algorithms that pertain to the evaluation of patients at risk for COVID-19 are in a state of rapid change based on information released by regulatory bodies including the CDC and federal and state organizations. These policies and algorithms were followed during the patient's care in the ED.   patient presents with generalized weakness and falls. Exam finds abdominal tenderness. CT abdomen and  pelvis unremarkable. No evidence of acute fractures. Labs show chronic anemia, UA unremarkable, CKD slightly worse than baseline, and sodium of 120 which is acute on chronic. Case discussed with the hospitalist for further management. IV saline bolus ordered to prevent hyponatremic seizure.  Initial vitals unremarkable      ____________________________________________   FINAL CLINICAL IMPRESSION(S) / ED DIAGNOSES    Final diagnoses:  Generalized weakness  Hyponatremia  Chronic anemia     ED Discharge Orders    None      Portions of this note were generated with dragon dictation software. Dictation errors may occur despite best attempts at proofreading.   Carrie Mew, MD 07/22/19 1550

## 2019-07-22 NOTE — ED Triage Notes (Addendum)
Pt arrives via POV with family. Pt is blind and daughter at side for interpreter. Pt states weakness and no appetite that has been going on for about a week. Pt daughter states she fell yesterday in the livingroom. Pt states she hit her head and arms. Pt denies any LOC.  Pt states nausea. Pt denies any CP or SOB. Pt states lower belly pain.

## 2019-07-22 NOTE — H&P (Addendum)
History and Physical    Robin Rogers:[id number] DOB: Aug 23, 1940 DOA: 07/22/2019  PCP: [facility] [place]  Patient coming from: home   Chief Complaint: generalized weakness   HPI: 79 y/o F w/ PMH of CKDIV, HTN, ACD, HLD, CHF, GERD who presents w/ weakness & no appetite x 1 week. All of hx was obtained from Robin Rogers's daughter who was at bedside via an interpreter. Robin Rogers has had multiple falls at home w/ most recently being day prior to admission. Robin Rogers denies hitting her head or LOC. Robin Rogers c/o feeling full despite not having an appetite. Robin Rogers also c/o nausea but no vomiting. Robin Rogers denies any fever, chills, sweating, chest pain, shortness of breath, vomiting, abd pain, dysuria, urinary urgency, urinary frequency, diarrhea or constipation.   Review of Systems: As per HPI otherwise 10 point review of systems negative.    Past Medical History:  Diagnosis Date  . Anemia of chronic disease   . Blindness   . Chronic combined systolic (congestive) and diastolic (congestive) heart failure ([place])    a. 09/2017 Echo: EF 45-50%, Gr2 DD; b. 05/2019 Echo: EF 40-45%, Gr III DD (restrictive), nl RV fxn, mod elev PASP. Mod dil LA. Mild MR.  . CKD (chronic kidney disease), stage IV ([place])   . Diet-controlled diabetes mellitus ([place])   . Hypertension     Past Surgical History:  Procedure Laterality Date  . CHOLECYSTECTOMY    . ESOPHAGOGASTRODUODENOSCOPY N/A 09/24/2017   Procedure: ESOPHAGOGASTRODUODENOSCOPY (EGD);  Surgeon: Toledo, Benay Pike, MD;  Location: [facility] ENDOSCOPY;  Service: Gastroenterology;  Laterality: N/A;     reports that she has never smoked. She has never used smokeless tobacco. She reports that she does not drink alcohol and does not use drugs.  No Known Allergies  No family history on file.  Fam hx: Robin Rogers is unable to provide accurate family hx   Prior to Admission medications   Medication Sig Start Date End Date Taking? Authorizing Provider  aspirin EC 81 MG EC tablet Take 1  tablet (81 mg total) by mouth daily. 05/16/19   Swayze, Ava, DO  carvedilol (COREG) 6.25 MG tablet Take 1 tablet (6.25 mg total) by mouth 2 (two) times daily with a meal. 05/16/19   Swayze, Ava, DO  ferrous sulfate 325 (65 FE) MG tablet Take 325 mg by mouth 2 (two) times daily with a meal.    [provider]  furosemide (LASIX) 40 MG tablet Hold until followup with PCP. 06/03/19   Enzo Bi, MD  hydrALAZINE (APRESOLINE) 10 MG tablet Take 1 tablet (10 mg total) by mouth 3 (three) times daily. 05/16/19   Swayze, Ava, DO  lovastatin (MEVACOR) 40 MG tablet Take 1 tablet by mouth at bedtime.  01/21/17   [provider]  melatonin 1 MG TABS tablet Take 2 mg by mouth at bedtime.    [provider]  omeprazole (PRILOSEC) 40 MG capsule Take 40 mg by mouth daily.    [provider]  ondansetron (ZOFRAN-ODT) 4 MG disintegrating tablet Take 4 mg by mouth every 8 (eight) hours as needed. 05/25/19   [provider]  traZODone (DESYREL) 50 MG tablet Take 50 mg by mouth at bedtime. 05/25/19   [provider]  Vitamin D, Ergocalciferol, (DRISDOL) 1.25 MG (50000 UNIT) CAPS capsule Take 50,000 Units by mouth every 7 (seven) days. [facility])    [provider]    Physical Exam: Vitals:   07/22/19 1201 07/22/19 1205 07/22/19 1409 07/22/19 1410  BP: Marland Kitchen)  145/56  (!) 167/70   Pulse: 71   68  Resp: 18   10  Temp: 98.4 F (36.9 C)     SpO2: 99%   98%  Weight:  68 kg    Height:  5\' 2"  (1.575 m)      Constitutional: NAD, calm, comfortable Vitals:   07/22/19 1201 07/22/19 1205 07/22/19 1409 07/22/19 1410  BP: (!) 145/56  (!) 167/70   Pulse: 71   68  Resp: 18   10  Temp: 98.4 F (36.9 C)     SpO2: 99%   98%  Weight:  68 kg    Height:  5\' 2"  (1.575 m)     Eyes:  lids and conjunctivae normal ENMT: Mucous membranes are moist.  Neck: normal, supple Respiratory: clear to auscultation bilaterally, no wheezing, no crackles.  Cardiovascular: S1/S2+, no rubs  / gallops.  B/l LE 2+ pitting edema Abdomen: soft, obese, no tenderness.  Bowel sounds positive.  Musculoskeletal: no clubbing / cyanosis.  Skin: no rashes, lesions. No induration Neurologic: CN 3-12 grossly intact. Decreased strength of b/l LE Psychiatric: Normal judgment and insight. Flat mood and affect    Labs on Admission: I have personally reviewed following labs and imaging studies  CBC: Recent Labs  Lab 07/22/19 1153  WBC 4.8  HGB 7.5*  HCT 22.8*  MCV 95.4  PLT 740   Basic Metabolic Panel: Recent Labs  Lab 07/22/19 1153  NA 120*  K 4.5  CL 89*  CO2 23  GLUCOSE 186*  BUN 50*  CREATININE 4.66*  CALCIUM 7.6*   GFR: Estimated Creatinine Clearance: 9.1 mL/min (A) (by C-G formula based on SCr of 4.66 mg/dL (H)). Liver Function Tests: No results for input(s): AST, ALT, ALKPHOS, BILITOT, PROT, ALBUMIN in the last 168 hours. No results for input(s): LIPASE, AMYLASE in the last 168 hours. No results for input(s): AMMONIA in the last 168 hours. Coagulation Profile: No results for input(s): INR, PROTIME in the last 168 hours. Cardiac Enzymes: No results for input(s): CKTOTAL, CKMB, CKMBINDEX, TROPONINI in the last 168 hours. BNP (last 3 results) No results for input(s): PROBNP in the last 8760 hours. HbA1C: No results for input(s): HGBA1C in the last 72 hours. CBG: No results for input(s): GLUCAP in the last 168 hours. Lipid Profile: No results for input(s): CHOL, HDL, LDLCALC, TRIG, CHOLHDL, LDLDIRECT in the last 72 hours. Thyroid Function Tests: No results for input(s): TSH, T4TOTAL, FREET4, T3FREE, THYROIDAB in the last 72 hours. Anemia Panel: No results for input(s): VITAMINB12, FOLATE, FERRITIN, TIBC, IRON, RETICCTPCT in the last 72 hours. Urine analysis:    Component Value Date/Time   COLORURINE YELLOW (A) 07/22/2019 1501   APPEARANCEUR HAZY (A) 07/22/2019 1501   LABSPEC 1.008 07/22/2019 1501   PHURINE 5.0 07/22/2019 1501   GLUCOSEU 50 (A) 07/22/2019  1501   HGBUR NEGATIVE 07/22/2019 1501   BILIRUBINUR NEGATIVE 07/22/2019 1501   KETONESUR NEGATIVE 07/22/2019 1501   PROTEINUR >=300 (A) 07/22/2019 1501   NITRITE NEGATIVE 07/22/2019 1501   LEUKOCYTESUR NEGATIVE 07/22/2019 1501    Radiological Exams on Admission: No results found.  EKG: Independently reviewed.   Assessment/Plan Active Problems:   Hyponatremia  Hyponatremia: chronic. Na level Q6H. Continue on IVFs. Avoid correcting Na more than 56mEq in 24 hrs to avoid central pontine myelinosis. ? Robin Rogers taking NaCl tabs at home.  CKD: unknown baseline Cr/GFR but currently stage V. If Cr continues to rise will consult nephro. Not on HD   Generalized weakness: likely  secondary to hyponatremia. Will consult Robin Rogers/OT   Chronic combined CHF: will hold home dose of lasix secondary to CKD & hyponatremia. Continue on carvedilol, statin   DM2: likely poorly controlled. Will check HbA1c. Will start SSI w/ accuchecks   HTN: will continue on home dose of carvedilol. Will hold home dose of lasix secondary to hyponatremia & CKD   HLD: will continue on statin  GERD: will continue on PPI  ACD: likely secondary to CKD. Will transfuse if Hb < 7. Will continue on iron supplements. Will continue to monitor      DVT prophylaxis: heparin Code Status: full  Family Communication: discussed Robin Rogers's care w/ Robin Rogers's daughter who is at bedside and answered her questions  Disposition Plan: depends on Robin Rogers/OT recs Consults called: none Admission status: inpatient    Wyvonnia Dusky MD [facility] Pager [phone]   If 7PM-7AM, please contact night-coverage [email or web address]  07/22/2019, 3:27 PM

## 2019-07-22 NOTE — ED Notes (Signed)
Interpreter requested 

## 2019-07-22 NOTE — ED Notes (Signed)
Interpreter at bedside. Fall X 2 days ago, weakness, fatigue, nausea.

## 2019-07-22 NOTE — ED Notes (Signed)
Pt taken to floor with hospital transport via stretcher on monitor.

## 2019-07-22 NOTE — ED Notes (Signed)
Daughter states pts whole body hurts, due to fall the other day.

## 2019-07-22 NOTE — ED Notes (Signed)
Daughter states for 1 week pt has not been eating, nauseated and feeling week. Daughter states pt has been falling, but not having LOC. Pt uses a cane at home. Pt lives with spouse. Daughter states pt does not have any memory problems. Daughter states pt is blind. Daughter states pt has had nausea. Pt states she feels full, but is not eating. Pt denies burning with urination, denies diarrhea or constipation with last bowel movement yesterday. Daughter states pt does have kidney problems, but does not do dialysis. Daughter states pt does have diabetes.   Interpreter Mildret [phone].  Admit hospital provider at bedside.

## 2019-07-23 DIAGNOSIS — D649 Anemia, unspecified: Secondary | ICD-10-CM

## 2019-07-23 DIAGNOSIS — I5023 Acute on chronic systolic (congestive) heart failure: Secondary | ICD-10-CM

## 2019-07-23 DIAGNOSIS — N185 Chronic kidney disease, stage 5: Secondary | ICD-10-CM

## 2019-07-23 LAB — CBC
HCT: 20.1 % — ABNORMAL LOW (ref 36.0–46.0)
Hemoglobin: 6.9 g/dL — ABNORMAL LOW (ref 12.0–15.0)
MCH: 31.7 pg (ref 26.0–34.0)
MCHC: 34.3 g/dL (ref 30.0–36.0)
MCV: 92.2 fL (ref 80.0–100.0)
Platelets: 182 10*3/uL (ref 150–400)
RBC: 2.18 MIL/uL — ABNORMAL LOW (ref 3.87–5.11)
RDW: 13.1 % (ref 11.5–15.5)
WBC: 4.6 10*3/uL (ref 4.0–10.5)
nRBC: 0 % (ref 0.0–0.2)

## 2019-07-23 LAB — IRON AND TIBC
Iron: 23 ug/dL — ABNORMAL LOW (ref 28–170)
Saturation Ratios: 12 % (ref 10.4–31.8)
TIBC: 190 ug/dL — ABNORMAL LOW (ref 250–450)
UIBC: 167 ug/dL

## 2019-07-23 LAB — BASIC METABOLIC PANEL
Anion gap: 8 (ref 5–15)
BUN: 47 mg/dL — ABNORMAL HIGH (ref 8–23)
CO2: 23 mmol/L (ref 22–32)
Calcium: 7.6 mg/dL — ABNORMAL LOW (ref 8.9–10.3)
Chloride: 93 mmol/L — ABNORMAL LOW (ref 98–111)
Creatinine, Ser: 4.46 mg/dL — ABNORMAL HIGH (ref 0.44–1.00)
GFR calc Af Amer: 10 mL/min — ABNORMAL LOW (ref >60)
GFR calc non Af Amer: 9 mL/min — ABNORMAL LOW (ref >60)
Glucose, Bld: 85 mg/dL (ref 70–99)
Potassium: 4.4 mmol/L (ref 3.5–5.1)
Sodium: 124 mmol/L — ABNORMAL LOW (ref 135–145)

## 2019-07-23 LAB — RETICULOCYTES
Immature Retic Fract: 15.2 % (ref 2.3–15.9)
RBC.: 2.19 MIL/uL — ABNORMAL LOW (ref 3.87–5.11)
Retic Count, Absolute: 35.7 10*3/uL (ref 19.0–186.0)
Retic Ct Pct: 1.6 % (ref 0.4–3.1)

## 2019-07-23 LAB — SODIUM
Sodium: 125 mmol/L — ABNORMAL LOW (ref 135–145)
Sodium: 124 mmol/L — ABNORMAL LOW (ref 135–145)

## 2019-07-23 LAB — HEMOGLOBIN A1C
Hgb A1c MFr Bld: 5.6 % (ref 4.8–5.6)
Mean Plasma Glucose: 114.02 mg/dL

## 2019-07-23 LAB — GLUCOSE, CAPILLARY
Glucose-Capillary: 80 mg/dL (ref 70–99)
Glucose-Capillary: 144 mg/dL — ABNORMAL HIGH (ref 70–99)
Glucose-Capillary: 99 mg/dL (ref 70–99)
Glucose-Capillary: 125 mg/dL — ABNORMAL HIGH (ref 70–99)

## 2019-07-23 LAB — FOLATE: Folate: 19.3 ng/mL (ref >5.9)

## 2019-07-23 LAB — BRAIN NATRIURETIC PEPTIDE: B Natriuretic Peptide: 4138.4 pg/mL — ABNORMAL HIGH (ref 0.0–100.0)

## 2019-07-23 LAB — SODIUM, URINE, RANDOM: Sodium, Ur: 87 mmol/L

## 2019-07-23 LAB — HEMOGLOBIN AND HEMATOCRIT, BLOOD
HCT: 24.6 % — ABNORMAL LOW (ref 36.0–46.0)
Hemoglobin: 8.4 g/dL — ABNORMAL LOW (ref 12.0–15.0)

## 2019-07-23 LAB — OSMOLALITY, URINE: Osmolality, Ur: 254 mOsm/kg — ABNORMAL LOW (ref 300–900)

## 2019-07-23 LAB — OSMOLALITY: Osmolality: 275 mOsm/kg (ref 275–295)

## 2019-07-23 LAB — FERRITIN: Ferritin: 248 ng/mL (ref 11–307)

## 2019-07-23 LAB — PREPARE RBC (CROSSMATCH)

## 2019-07-23 LAB — VITAMIN B12: Vitamin B-12: 391 pg/mL (ref 180–914)

## 2019-07-23 MED ORDER — SODIUM CHLORIDE 0.9% IV SOLUTION: Freq: Once | INTRAVENOUS | Status: DC

## 2019-07-23 MED ORDER — FUROSEMIDE 10 MG/ML IJ SOLN
40.0000 mg | Freq: Two times a day (BID) | INTRAMUSCULAR | Status: DC
  Administered 2019-07-23 – 2019-07-25 (×5): 40 mg via INTRAVENOUS
  Filled 2019-07-23 (×5): qty 4

## 2019-07-23 MED ORDER — TRAZODONE HCL 50 MG PO TABS
50.0000 mg | ORAL_TABLET | Freq: Every day | ORAL | Status: DC
  Administered 2019-07-23 – 2019-07-26 (×4): 50 mg via ORAL
  Filled 2019-07-23 (×4): qty 1

## 2019-07-23 MED ORDER — TRAZODONE HCL 50 MG PO TABS
50.0000 mg | ORAL_TABLET | Freq: Every evening | ORAL | Status: DC | PRN
  Administered 2019-07-25: 50 mg via ORAL
  Filled 2019-07-23: qty 1

## 2019-07-23 MED ORDER — ENSURE ENLIVE PO LIQD
237.0000 mL | Freq: Two times a day (BID) | ORAL | Status: DC
  Administered 2019-07-26 – 2019-07-27 (×2): 237 mL via ORAL

## 2019-07-23 MED ORDER — RENA-VITE PO TABS
1.0000 | ORAL_TABLET | Freq: Every day | ORAL | Status: DC
  Administered 2019-07-23 – 2019-07-25 (×3): 1 via ORAL
  Filled 2019-07-23 (×5): qty 1

## 2019-07-23 NOTE — Progress Notes (Signed)
PROGRESS NOTE    Robin Rogers  PQZ:[id number] DOB: May 09, 1940 DOA: 07/22/2019 PCP: [facility]   Brief Narrative:  79 y/o F w/ PMH of CKDIV, HTN, ACD, HLD, CHF, GERD who presents w/ weakness & no appetite x 1 week. Pt has had multiple falls at home w/ most recently being day prior to admission. Pt denies hitting her head or LOC. Pt c/o feeling full despite not having an appetite. Pt also c/o nausea but no vomiting.  Patient is a [name] and needs interpreter. Patient was seen by [facility] on 5/24 where dialysis was discussed. Patient states she would do dialysis only if it was needed. Patient was then admitted to [facility] from 5/24 to 5/28. She deferred starting dialysis on that admission.  On arrival she had sodium of 120 and admitted for hyponatremia.  Subjective: Patient was complaining of generalized malaise, pain in her neck and back.  She was also complaining about the nursing staff that they did not take care of her overnight and she was in her wet bed the whole night as her pure wick came off.  Continue to have some nausea and decreased appetite.  No vomiting. Patient is a [name] and exam was done with the help of an interpreter.  Assessment & Plan:   Active Problems:   Hyponatremia  Hyponatremia. No Hyponatremia labs drawn.  Seems chronic most likely secondary to volume overload with worsening renal status.  She received a liter of bolus in ED and started on normal saline. On exam she appears volume overload with history of HFrEF and worsening renal status. -Discontinue IV fluid. -Start her on IV Lasix. -Nephrology consult. -Hyponatremia labs. -Check TSH. -Continue to monitor  CKD stage V.  BUN/creatinine appears to be at her baseline.  Loss of appetite and nausea can be due to uremic symptoms.  Seems like patient is approaching for dialysis.  Nephrology was consulted today and patient is still not ready for dialysis.   She will discuss with her family and let them know. -Continue to monitor. -Palliative care consult.  HFrEF.  Prior echo with EF of 40 to 45% in May 2021.  BMP checked today as she appears volume of was more than 4000.  Was given some IV fluid overnight. -Discontinue IV fluid. -Her on IV Lasix 40 mg twice daily. -Daily BMP. -Strict intake and output. -Daily weight. -Continue home dose of carvedilol and statin.  Generalized weakness.  Most likely secondary to worsening renal function, hyponatremia and HFrEF. -PT/OT evaluation.  Anemia.  Hemoglobin dropped to 6.9.  Patient has advanced kidney disease. -Anemia panel. -Might need Aranesp. -Transfuse with 1 unit PRBC. -Continue to monitor  Hypertension.  Blood pressure mildly elevated. -Continue home dose of carvedilol. -Continue home dose of hydralazine. -Starting her on IV Lasix which will also help.  Hyperlipidemia. -Continue statin  GERD. -Continue PPI  Objective: Vitals:   07/22/19 2025 07/22/19 2347 07/23/19 0507 07/23/19 0753  BP: (!) 146/66 (!) 141/55 (!) 151/57 (!) 152/56  Pulse: 68 63 61 62  Resp: 20 16 16 20   Temp: (!) 97.5 F (36.4 C) 98 F (36.7 C) 98.6 F (37 C) 98.3 F (36.8 C)  TempSrc: Oral Oral Oral Oral  SpO2: 100% 100% 98% 99%  Weight:      Height:        Intake/Output Summary (Last 24 hours) at 07/23/2019 1547 Last data filed at 07/23/2019 0511 Gross per 24 hour  Intake 320.91 ml  Output 900 ml  Net -579.09 ml   Filed Weights   07/22/19 1205  Weight: 68 kg    Examination:  General exam: Chronically ill-appearing lady, appears calm and comfortable  Respiratory system: Few bilateral basal crackles. Respiratory effort normal. Cardiovascular system: S1 & S2 heard, RRR. No JVD, murmurs, rubs, gallops or clicks. Gastrointestinal system: Soft, nontender, nondistended, bowel sounds positive.  Abdominal wall edema. Central nervous system: Alert and oriented. No focal neurological  deficits. Extremities: 1+ LE edema, no cyanosis, pulses intact and symmetrical. Psychiatry: Judgement and insight appear normal.   DVT prophylaxis: Heparin Code Status: Full Family Communication: Called daughter with no response. Disposition Plan:  Status is: Inpatient  Remains inpatient appropriate because:Inpatient level of care appropriate due to severity of illness   Dispo: The patient is from: Home              Anticipated d/c is to: Home              Anticipated d/c date is: 2 days              Patient currently is not medically stable to d/c.  Patient with advanced renal disease, now approaching dialysis.  Quite reluctant to start.  She will discuss with family.  Patient is high risk for deterioration and death if refuse dialysis.  Remain full code.  Palliative care was consulted to discuss the goals of care.  Consultants:   Nephrology  Procedures:  Antimicrobials:   Data Reviewed: I have personally reviewed following labs and imaging studies  CBC: Recent Labs  Lab 07/22/19 1153 07/23/19 0530  WBC 4.8 4.6  HGB 7.5* 6.9*  HCT 22.8* 20.1*  MCV 95.4 92.2  PLT 205 759   Basic Metabolic Panel: Recent Labs  Lab 07/22/19 1153 07/22/19 1642 07/22/19 2206 07/23/19 0530 07/23/19 0902  NA 120* 123* 122* 124*  125* 124*  K 4.5  --   --  4.4  --   CL 89*  --   --  93*  --   CO2 23  --   --  23  --   GLUCOSE 186*  --   --  85  --   BUN 50*  --   --  47*  --   CREATININE 4.66*  --   --  4.46*  --   CALCIUM 7.6*  --   --  7.6*  --    GFR: Estimated Creatinine Clearance: 9.6 mL/min (A) (by C-G formula based on SCr of 4.46 mg/dL (H)). Liver Function Tests: No results for input(s): AST, ALT, ALKPHOS, BILITOT, PROT, ALBUMIN in the last 168 hours. No results for input(s): LIPASE, AMYLASE in the last 168 hours. No results for input(s): AMMONIA in the last 168 hours. Coagulation Profile: No results for input(s): INR, PROTIME in the last 168 hours. Cardiac Enzymes: No  results for input(s): CKTOTAL, CKMB, CKMBINDEX, TROPONINI in the last 168 hours. BNP (last 3 results) No results for input(s): PROBNP in the last 8760 hours. HbA1C: Recent Labs    07/23/19 0530  HGBA1C 5.6   CBG: Recent Labs  Lab 07/22/19 1833 07/22/19 2243 07/23/19 0753 07/23/19 1223  GLUCAP 125* 82 80 144*   Lipid Profile: No results for input(s): CHOL, HDL, LDLCALC, TRIG, CHOLHDL, LDLDIRECT in the last 72 hours. Thyroid Function Tests: No results for input(s): TSH, T4TOTAL, FREET4, T3FREE, THYROIDAB in the last 72 hours. Anemia Panel: No results for input(s): VITAMINB12, FOLATE, FERRITIN, TIBC, IRON, RETICCTPCT in the last 72  hours. Sepsis Labs: No results for input(s): PROCALCITON, LATICACIDVEN in the last 168 hours.  Recent Results (from the past 240 hour(s))  SARS Coronavirus 2 by RT PCR (hospital order, performed in [facility] hospital lab) Nasopharyngeal     Status: None   Collection Time: 07/22/19  2:57 PM   Specimen: Nasopharyngeal  Result Value Ref Range Status   SARS Coronavirus 2 NEGATIVE NEGATIVE Final    Comment: (NOTE) SARS-CoV-2 target nucleic acids are NOT DETECTED.  The SARS-CoV-2 RNA is generally detectable in upper and lower respiratory specimens during the acute phase of infection. The lowest concentration of SARS-CoV-2 viral copies this assay can detect is 250 copies / mL. A negative result does not preclude SARS-CoV-2 infection and should not be used as the sole basis for treatment or other patient management decisions.  A negative result may occur with improper specimen collection / handling, submission of specimen other than nasopharyngeal swab, presence of viral mutation(s) within the areas targeted by this assay, and inadequate number of viral copies (<250 copies / mL). A negative result must be combined with clinical observations, patient history, and epidemiological information.  Fact Sheet for Patients:    StrictlyIdeas.no  Fact Sheet for Healthcare Providers: BankingDealers.co.za  This test is not yet approved or  cleared by the [place] FDA and has been authorized for detection and/or diagnosis of SARS-CoV-2 by FDA under an Emergency Use Authorization (EUA).  This EUA will remain in effect (meaning this test can be used) for the duration of the COVID-19 declaration under Section 564(b)(1) of the Act, 21 U.S.C. section 360bbb-3(b)(1), unless the authorization is terminated or revoked sooner.  Performed at [facility], [address] [place], [place] [address]      Radiology Studies: CT ABDOMEN PELVIS WO CONTRAST  Result Date: 07/22/2019 CLINICAL DATA:  Abdominal pain, weakness, anorexia, fell yesterday EXAM: CT ABDOMEN AND PELVIS WITHOUT CONTRAST TECHNIQUE: Multidetector CT imaging of the abdomen and pelvis was performed following the standard protocol without IV contrast. COMPARISON:  09/22/2017 FINDINGS: Lower chest: There is chronic bilateral pleural thickening, with pleural calcification at the right costophrenic angle. Stable small pericardial effusion. Hepatobiliary: No focal liver abnormality is seen. Status post cholecystectomy. No biliary dilatation. Pancreas: Unremarkable. No pancreatic ductal dilatation or surrounding inflammatory changes. Spleen: Normal in size without focal abnormality. Adrenals/Urinary Tract: No urinary tract calculi or obstructive uropathy. There is bilateral renal cortical atrophy. Bladder is unremarkable. Stable adrenal glands. Stomach/Bowel: No bowel obstruction or ileus. Normal appendix right lower quadrant. No bowel wall thickening or inflammatory change. Vascular/Lymphatic: Aortic atherosclerosis. No enlarged abdominal or pelvic lymph nodes. Reproductive: Uterus and bilateral adnexa are unremarkable. Other: There is diffuse body wall edema greatest in the bilateral flanks. No free fluid or free  gas within the peritoneal cavity. No abdominal wall hernia. Musculoskeletal: No acute or destructive bony lesions. Reconstructed images demonstrate no additional findings. IMPRESSION: 1. No acute intra-abdominal or intrapelvic process. 2. Diffuse body wall edema. 3. Aortic Atherosclerosis (ICD10-I70.0). Electronically Signed   By: Randa Ngo M.D.   On: 07/22/2019 15:27    Scheduled Meds: . sodium chloride   Intravenous Once  . aspirin EC  81 mg Oral Daily  . carvedilol  6.25 mg Oral BID WC  . feeding supplement (ENSURE ENLIVE)  237 mL Oral BID BM  . ferrous sulfate  325 mg Oral BID WC  . furosemide  40 mg Intravenous BID  . heparin  5,000 Units Subcutaneous Q8H  . hydrALAZINE  10 mg  Oral TID  . insulin aspart  0-9 Units Subcutaneous TID WC  . multivitamin  1 tablet Oral QHS  . pantoprazole  40 mg Oral Daily  . pravastatin  40 mg Oral q1800  . sodium chloride flush  3 mL Intravenous Once  . sodium chloride flush  3 mL Intravenous Q12H   Continuous Infusions:   LOS: 1 day   Time spent: 50 minutes.  Reviewed her imaging and chart personally.  More than 50% of time was spent with direct patient care and planning.  Lorella Nimrod, MD [facility]  If 7PM-7AM, please contact night-coverage [email or web address]  07/23/2019, 3:47 PM   This record has been created using Systems analyst. Errors have been sought and corrected,but may not always be located. Such creation errors do not reflect on the standard of care.

## 2019-07-23 NOTE — Progress Notes (Signed)
Initial Nutrition Assessment  DOCUMENTATION CODES:   Not applicable  INTERVENTION:   Ensure Enlive po BID, each supplement provides 350 kcal and 20 grams of protein  Add Rena-Vite daily  Consider fluid restriction if no improvement with hyponatremia with diuresis.            NUTRITION DIAGNOSIS:   Inadequate oral intake related to poor appetite, nausea, acute illness as evidenced by per patient/family report.  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 PMH includes CKD IV, HTN, HLD, CHF, GERD, blindness, DM Pt preferred language is Spanish and needs interpretor.   No recorded po intake  +weakness, fatigue, nausea, anorexia   Nephrology consulted, per MD note, pt is unsure if she would want to initiate dialysis     Current weight 68 kg; pt with mild BLE edema. Unsure of dry weight. Per weight encounters, no weight loss trend since May. Pt has lost weight since 2019 with patient weighing between 77-81 kg.    Pt with 900 mL UOP overnight, no UOP documented today. Hyponatremic, being diuresed.         Labs: sodium 124 (L), Creatinine 4.46, BUN 47, serum calcium 7.6 but no albumin, iPTH 200 (May 2021) Meds: ferrous sulfate, lasix, ss novolog                  NUTRITION - FOCUSED PHYSICAL EXAM:  Deferred  Diet Order:   Diet Order            Diet Carb Modified Fluid consistency: Thin; Room service appropriate? Yes  Diet effective now                 EDUCATION NEEDS:   Not appropriate for education at this time  Skin:  Skin Assessment: Reviewed RN Assessment  Last BM:  7/15  Height:   Ht Readings from Last 1  Encounters:  07/22/19 5\' 2"  (1.575 m)    Weight:   Wt Readings from Last 1 Encounters:  07/22/19 68 kg    BMI:  Body mass index is 27.44 kg/m.  Estimated Nutritional Needs:   Kcal:  1750-1950 kcals  Protein:  88-98 g  Fluid:  >/= 1.5 L   Kerman Passey MS, RDN, LDN, CNSC Registered Dietitian III Clinical Nutrition RD Pager and On-Call Pager Number Located in [place]

## 2019-07-23 NOTE — Progress Notes (Signed)
[facility]  ROUNDING NOTE   Subjective:   History taken via daughter who speak Spanish and we used a Marketing executive. Patient speaks a dialect and direct interpretation was not available.   Patient admitted to [facility] on 07/22/2019 for weakness, shortness of breath, poor appetite and uremic symptoms. Patient was seen by [facility] on 5/24 where dialysis was discussed. Patient states she would do dialysis only if it was needed. Patient was then admitted to [facility] from 5/24 to 5/28. She deferred starting dialysis on that admission.   Today, patient is unsure if she would want to do dialysis.   Objective:  Vital signs in last 24 hours:  Temp:  [97.5 F (36.4 C)-98.6 F (37 C)] 98.3 F (36.8 C) (07/17 0753) Pulse Rate:  [61-69] 62 (07/17 0753) Resp:  [10-20] 20 (07/17 0753) BP: (141-175)/(55-78) 152/56 (07/17 0753) SpO2:  [98 %-100 %] 99 % (07/17 0753) Weight:  [68 kg] 68 kg (07/16 1205)  Weight change:  Filed Weights   07/22/19 1205  Weight: 68 kg    Intake/Output: I/O last 3 completed shifts: In: 320.9 [I.V.:320.9] Out: 900 [Urine:900]   Intake/Output this shift:  No intake/output data recorded.  Physical Exam: General: NAD, sitting up in bed  Head: Normocephalic, atraumatic. Moist oral mucosal membranes  Eyes: +blind  Neck: Supple, trachea midline  Lungs:  Basilar crackles  Heart: Regular rate and rhythm  Abdomen:  Soft, nontender,   Extremities:  trace peripheral edema.  Neurologic: Nonfocal, moving all four extremities  Skin: No lesions  Access: none    Basic Metabolic Panel: Recent Labs  Lab 07/22/19 1153 07/22/19 1642 07/22/19 2206 07/23/19 0530 07/23/19 0902  NA 120* 123* 122* 124*   125* 124*  K 4.5  --   --  4.4  --   CL 89*  --   --  93*  --   CO2 23  --   --  23  --   GLUCOSE 186*  --   --  85  --   BUN 50*  --   --  47*  --   CREATININE 4.66*  --   --  4.46*  --   CALCIUM 7.6*  --   --  7.6*  --     Liver Function Tests: No  results for input(s): AST, ALT, ALKPHOS, BILITOT, PROT, ALBUMIN in the last 168 hours. No results for input(s): LIPASE, AMYLASE in the last 168 hours. No results for input(s): AMMONIA in the last 168 hours.  CBC: Recent Labs  Lab 07/22/19 1153 07/23/19 0530  WBC 4.8 4.6  HGB 7.5* 6.9*  HCT 22.8* 20.1*  MCV 95.4 92.2  PLT 205 182    Cardiac Enzymes: No results for input(s): CKTOTAL, CKMB, CKMBINDEX, TROPONINI in the last 168 hours.  BNP: Invalid input(s): POCBNP  CBG: Recent Labs  Lab 07/22/19 1833 07/22/19 2243 07/23/19 0753  GLUCAP 125* 82 80    Microbiology: Results for orders placed or performed during the hospital encounter of 07/22/19  SARS Coronavirus 2 by RT PCR (hospital order, performed in [place] hospital lab) Nasopharyngeal     Status: None   Collection Time: 07/22/19  2:57 PM   Specimen: Nasopharyngeal  Result Value Ref Range Status   SARS Coronavirus 2 NEGATIVE NEGATIVE Final    Comment: (NOTE) SARS-CoV-2 target nucleic acids are NOT DETECTED.  The SARS-CoV-2 RNA is generally detectable in upper and lower respiratory specimens during the acute phase of infection. The lowest concentration of  SARS-CoV-2 viral copies this assay can detect is 250 copies / mL. A negative result does not preclude SARS-CoV-2 infection and should not be used as the sole basis for treatment or other patient management decisions.  A negative result may occur with improper specimen collection / handling, submission of specimen other than nasopharyngeal swab, presence of viral mutation(s) within the areas targeted by this assay, and inadequate number of viral copies (<250 copies / mL). A negative result must be combined with clinical observations, patient history, and epidemiological information.  Fact Sheet for Patients:   StrictlyIdeas.no  Fact Sheet for Healthcare Providers: BankingDealers.co.za  This test is not yet  approved or  cleared by the [place] FDA and has been authorized for detection and/or diagnosis of SARS-CoV-2 by FDA under an Emergency Use Authorization (EUA).  This EUA will remain in effect (meaning this test can be used) for the duration of the COVID-19 declaration under Section 564(b)(1) of the Act, 21 U.S.C. section 360bbb-3(b)(1), unless the authorization is terminated or revoked sooner.  Performed at [facility], [address] [place], [place] [address]     Coagulation Studies: No results for input(s): LABPROT, INR in the last 72 hours.  Urinalysis: Recent Labs    07/22/19 1501  COLORURINE YELLOW*  LABSPEC 1.008  PHURINE 5.0  GLUCOSEU 50*  HGBUR NEGATIVE  BILIRUBINUR NEGATIVE  KETONESUR NEGATIVE  PROTEINUR >=300*  NITRITE NEGATIVE  LEUKOCYTESUR NEGATIVE      Imaging: CT ABDOMEN PELVIS WO CONTRAST  Result Date: 07/22/2019 CLINICAL DATA:  Abdominal pain, weakness, anorexia, fell yesterday EXAM: CT ABDOMEN AND PELVIS WITHOUT CONTRAST TECHNIQUE: Multidetector CT imaging of the abdomen and pelvis was performed following the standard protocol without IV contrast. COMPARISON:  09/22/2017 FINDINGS: Lower chest: There is chronic bilateral pleural thickening, with pleural calcification at the right costophrenic angle. Stable small pericardial effusion. Hepatobiliary: No focal liver abnormality is seen. Status post cholecystectomy. No biliary dilatation. Pancreas: Unremarkable. No pancreatic ductal dilatation or surrounding inflammatory changes. Spleen: Normal in size without focal abnormality. Adrenals/Urinary Tract: No urinary tract calculi or obstructive uropathy. There is bilateral renal cortical atrophy. Bladder is unremarkable. Stable adrenal glands. Stomach/Bowel: No bowel obstruction or ileus. Normal appendix right lower quadrant. No bowel wall thickening or inflammatory change. Vascular/Lymphatic: Aortic atherosclerosis. No enlarged abdominal or pelvic  lymph nodes. Reproductive: Uterus and bilateral adnexa are unremarkable. Other: There is diffuse body wall edema greatest in the bilateral flanks. No free fluid or free gas within the peritoneal cavity. No abdominal wall hernia. Musculoskeletal: No acute or destructive bony lesions. Reconstructed images demonstrate no additional findings. IMPRESSION: 1. No acute intra-abdominal or intrapelvic process. 2. Diffuse body wall edema. 3. Aortic Atherosclerosis (ICD10-I70.0). Electronically Signed   By: Randa Ngo M.D.   On: 07/22/2019 15:27     Medications:     sodium chloride   Intravenous Once   aspirin EC  81 mg Oral Daily   carvedilol  6.25 mg Oral BID WC   ferrous sulfate  325 mg Oral BID WC   furosemide  40 mg Intravenous BID   heparin  5,000 Units Subcutaneous Q8H   hydrALAZINE  10 mg Oral TID   insulin aspart  0-9 Units Subcutaneous TID WC   pantoprazole  40 mg Oral Daily   pravastatin  40 mg Oral q1800   sodium chloride flush  3 mL Intravenous Once   sodium chloride flush  3 mL Intravenous Q12H   acetaminophen **OR** acetaminophen, bisacodyl, morphine injection, ondansetron **OR** ondansetron (  ZOFRAN) IV, traMADol  Assessment/ Plan:  Ms. Robin Rogers is a 79 y.o. hispanic female with congestive heart failure, hypertension, diabetes mellitus type 2, blindness, who is admitted to [facility] on 07/22/2019 for Hyponatremia [E87.1] Chronic anemia [D64.9] Generalized weakness [R53.1]  1. Chronic kidney disease stage V. Patient with uremic symptoms. Most likely has progressed to end stage renal disease. Patient is unsure if she wants to do dialysis. She wants to discuss this with her family and then get back with the care team.  Followed by [facility] as an outpatient.   2. Anemia of chronic kidney disease  - PRBC transfusion scheduled.   3. Hyponatremia: secondary to congestive heart failure and renal failure. Volume overloaded - Furosemide IV  4. Secondary  Hyperparathyroidism: PTH 200 on 5/9. With hypocalcemia. Not currently on binders or vitamin D agents.   5. Hypertension: 152/56.  - carvedilol, hydralazine and IV furosemide.    LOS: 1 Adalynd Donahoe 7/17/202112:02 PM

## 2019-07-23 NOTE — Plan of Care (Signed)
Pt w/low Na level has been experiencing nausea w/any PO intake for about a week.  Poor PO intake.  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

## 2019-07-24 ENCOUNTER — Inpatient Hospital Stay: Payer: Medicare Other

## 2019-07-24 LAB — THYROID PANEL WITH TSH
Free Thyroxine Index: 3.3 (ref 1.2–4.9)
T3 Uptake Ratio: 36 % (ref 24–39)
T4, Total: 9.1 ug/dL (ref 4.5–12.0)
TSH: 1.31 u[IU]/mL (ref 0.450–4.500)

## 2019-07-24 LAB — BASIC METABOLIC PANEL
Anion gap: 6 (ref 5–15)
BUN: 48 mg/dL — ABNORMAL HIGH (ref 8–23)
CO2: 23 mmol/L (ref 22–32)
Calcium: 7.1 mg/dL — ABNORMAL LOW (ref 8.9–10.3)
Chloride: 95 mmol/L — ABNORMAL LOW (ref 98–111)
Creatinine, Ser: 4.45 mg/dL — ABNORMAL HIGH (ref 0.44–1.00)
GFR calc Af Amer: 10 mL/min — ABNORMAL LOW (ref >60)
GFR calc non Af Amer: 9 mL/min — ABNORMAL LOW (ref >60)
Glucose, Bld: 75 mg/dL (ref 70–99)
Potassium: 4.4 mmol/L (ref 3.5–5.1)
Sodium: 124 mmol/L — ABNORMAL LOW (ref 135–145)

## 2019-07-24 LAB — BPAM RBC
Blood Product Expiration Date: 202107202359
ISSUE DATE / TIME: 202107171632
Unit Type and Rh: 9500

## 2019-07-24 LAB — TYPE AND SCREEN
ABO/RH(D): O POS
Antibody Screen: NEGATIVE
Unit division: 0

## 2019-07-24 LAB — CBC
HCT: 21.3 % — ABNORMAL LOW (ref 36.0–46.0)
Hemoglobin: 7.1 g/dL — ABNORMAL LOW (ref 12.0–15.0)
MCH: 31.4 pg (ref 26.0–34.0)
MCHC: 33.3 g/dL (ref 30.0–36.0)
MCV: 94.2 fL (ref 80.0–100.0)
Platelets: 160 10*3/uL (ref 150–400)
RBC: 2.26 MIL/uL — ABNORMAL LOW (ref 3.87–5.11)
RDW: 13.6 % (ref 11.5–15.5)
WBC: 3.8 10*3/uL — ABNORMAL LOW (ref 4.0–10.5)
nRBC: 0 % (ref 0.0–0.2)

## 2019-07-24 LAB — GLUCOSE, CAPILLARY
Glucose-Capillary: 81 mg/dL (ref 70–99)
Glucose-Capillary: 150 mg/dL — ABNORMAL HIGH (ref 70–99)
Glucose-Capillary: 134 mg/dL — ABNORMAL HIGH (ref 70–99)
Glucose-Capillary: 87 mg/dL (ref 70–99)

## 2019-07-24 LAB — HEMOGLOBIN AND HEMATOCRIT, BLOOD
HCT: 23 % — ABNORMAL LOW (ref 36.0–46.0)
Hemoglobin: 8 g/dL — ABNORMAL LOW (ref 12.0–15.0)

## 2019-07-24 MED ORDER — DOCUSATE SODIUM 100 MG PO CAPS
100.0000 mg | ORAL_CAPSULE | Freq: Two times a day (BID) | ORAL | Status: DC
  Administered 2019-07-24 – 2019-07-27 (×4): 100 mg via ORAL
  Filled 2019-07-24 (×5): qty 1

## 2019-07-24 NOTE — Progress Notes (Signed)
[facility]  ROUNDING NOTE   Subjective:   History taken via daughter. Patient speaks [place] dialect.   Patient states she is interested in doing dialysis as long as she has proper care for her husband.   Objective:  Vital signs in last 24 hours:  Temp:  [97.7 F (36.5 C)-98.7 F (37.1 C)] 98.6 F (37 C) (07/18 1234) Pulse Rate:  [55-76] 76 (07/18 1234) Resp:  [14-21] 14 (07/18 1234) BP: (119-153)/(47-64) 126/64 (07/18 1234) SpO2:  [98 %-100 %] 98 % (07/18 1234) Weight:  [71.9 kg] 71.9 kg (07/18 0500)  Weight change: 3.856 kg Filed Weights   07/22/19 1205 07/24/19 0500  Weight: 68 kg 71.9 kg    Intake/Output: I/O last 3 completed shifts: In: 658.9 [I.V.:320.9; Blood:338] Out: 900 [Urine:900]   Intake/Output this shift:  No intake/output data recorded.  Physical Exam: General: NAD, sitting up in bed  Head: Normocephalic, atraumatic. Moist oral mucosal membranes  Eyes: +blind  Neck: Supple, trachea midline  Lungs:  Basilar crackles  Heart: Regular rate and rhythm  Abdomen:  Soft, nontender,   Extremities:  trace peripheral edema.  Neurologic: Nonfocal, moving all four extremities  Skin: No lesions  Access: none    Basic Metabolic Panel: Recent Labs  Lab 07/22/19 1153 07/22/19 1153 07/22/19 1642 07/22/19 2206 07/23/19 0530 07/23/19 0902 07/24/19 0333  NA 120*   < > 123* 122* 124*   125* 124* 124*  K 4.5  --   --   --  4.4  --  4.4  CL 89*  --   --   --  93*  --  95*  CO2 23  --   --   --  23  --  23  GLUCOSE 186*  --   --   --  85  --  75  BUN 50*  --   --   --  47*  --  48*  CREATININE 4.66*  --   --   --  4.46*  --  4.45*  CALCIUM 7.6*  --   --   --  7.6*  --  7.1*   < > = values in this interval not displayed.    Liver Function Tests: No results for input(s): AST, ALT, ALKPHOS, BILITOT, PROT, ALBUMIN in the last 168 hours. No results for input(s): LIPASE, AMYLASE in the last 168 hours. No results for input(s): AMMONIA in  the last 168 hours.  CBC: Recent Labs  Lab 07/22/19 1153 07/23/19 0530 07/23/19 2043 07/24/19 0333 07/24/19 1211  WBC 4.8 4.6  --  3.8*  --   HGB 7.5* 6.9* 8.4* 7.1* 8.0*  HCT 22.8* 20.1* 24.6* 21.3* 23.0*  MCV 95.4 92.2  --  94.2  --   PLT 205 182  --  160  --     Cardiac Enzymes: No results for input(s): CKTOTAL, CKMB, CKMBINDEX, TROPONINI in the last 168 hours.  BNP: Invalid input(s): POCBNP  CBG: Recent Labs  Lab 07/23/19 1223 07/23/19 1626 07/23/19 2133 07/24/19 0811 07/24/19 1228  GLUCAP 144* 99 125* 81 150*    Microbiology: Results for orders placed or performed during the hospital encounter of 07/22/19  SARS Coronavirus 2 by RT PCR (hospital order, performed in [place] hospital lab) Nasopharyngeal     Status: None   Collection Time: 07/22/19  2:57 PM   Specimen: Nasopharyngeal  Result Value Ref Range Status   SARS Coronavirus 2 NEGATIVE NEGATIVE Final    Comment: (NOTE) SARS-CoV-2 target  nucleic acids are NOT DETECTED.  The SARS-CoV-2 RNA is generally detectable in upper and lower respiratory specimens during the acute phase of infection. The lowest concentration of SARS-CoV-2 viral copies this assay can detect is 250 copies / mL. A negative result does not preclude SARS-CoV-2 infection and should not be used as the sole basis for treatment or other patient management decisions.  A negative result may occur with improper specimen collection / handling, submission of specimen other than nasopharyngeal swab, presence of viral mutation(s) within the areas targeted by this assay, and inadequate number of viral copies (<250 copies / mL). A negative result must be combined with clinical observations, patient history, and epidemiological information.  Fact Sheet for Patients:   StrictlyIdeas.no  Fact Sheet for Healthcare Providers: BankingDealers.co.za  This test is not yet approved or  cleared by the  [place] FDA and has been authorized for detection and/or diagnosis of SARS-CoV-2 by FDA under an Emergency Use Authorization (EUA).  This EUA will remain in effect (meaning this test can be used) for the duration of the COVID-19 declaration under Section 564(b)(1) of the Act, 21 U.S.C. section 360bbb-3(b)(1), unless the authorization is terminated or revoked sooner.  Performed at [facility], [address] [place], [place] [address]     Coagulation Studies: No results for input(s): LABPROT, INR in the last 72 hours.  Urinalysis: Recent Labs    07/22/19 1501  COLORURINE YELLOW*  LABSPEC 1.008  PHURINE 5.0  GLUCOSEU 50*  HGBUR NEGATIVE  BILIRUBINUR NEGATIVE  KETONESUR NEGATIVE  PROTEINUR >=300*  NITRITE NEGATIVE  LEUKOCYTESUR NEGATIVE      Imaging: CT ABDOMEN PELVIS WO CONTRAST  Result Date: 07/22/2019 CLINICAL DATA:  Abdominal pain, weakness, anorexia, fell yesterday EXAM: CT ABDOMEN AND PELVIS WITHOUT CONTRAST TECHNIQUE: Multidetector CT imaging of the abdomen and pelvis was performed following the standard protocol without IV contrast. COMPARISON:  09/22/2017 FINDINGS: Lower chest: There is chronic bilateral pleural thickening, with pleural calcification at the right costophrenic angle. Stable small pericardial effusion. Hepatobiliary: No focal liver abnormality is seen. Status post cholecystectomy. No biliary dilatation. Pancreas: Unremarkable. No pancreatic ductal dilatation or surrounding inflammatory changes. Spleen: Normal in size without focal abnormality. Adrenals/Urinary Tract: No urinary tract calculi or obstructive uropathy. There is bilateral renal cortical atrophy. Bladder is unremarkable. Stable adrenal glands. Stomach/Bowel: No bowel obstruction or ileus. Normal appendix right lower quadrant. No bowel wall thickening or inflammatory change. Vascular/Lymphatic: Aortic atherosclerosis. No enlarged abdominal or pelvic lymph nodes. Reproductive:  Uterus and bilateral adnexa are unremarkable. Other: There is diffuse body wall edema greatest in the bilateral flanks. No free fluid or free gas within the peritoneal cavity. No abdominal wall hernia. Musculoskeletal: No acute or destructive bony lesions. Reconstructed images demonstrate no additional findings. IMPRESSION: 1. No acute intra-abdominal or intrapelvic process. 2. Diffuse body wall edema. 3. Aortic Atherosclerosis (ICD10-I70.0). Electronically Signed   By: Randa Ngo M.D.   On: 07/22/2019 15:27     Medications:     sodium chloride   Intravenous Once   aspirin EC  81 mg Oral Daily   carvedilol  6.25 mg Oral BID WC   feeding supplement (ENSURE ENLIVE)  237 mL Oral BID BM   ferrous sulfate  325 mg Oral BID WC   furosemide  40 mg Intravenous BID   heparin  5,000 Units Subcutaneous Q8H   hydrALAZINE  10 mg Oral TID   insulin aspart  0-9 Units Subcutaneous TID WC   multivitamin  1 tablet Oral  QHS   pantoprazole  40 mg Oral Daily   pravastatin  40 mg Oral q1800   sodium chloride flush  3 mL Intravenous Once   sodium chloride flush  3 mL Intravenous Q12H   traZODone  50 mg Oral QHS   acetaminophen **OR** acetaminophen, bisacodyl, morphine injection, ondansetron **OR** ondansetron (ZOFRAN) IV, traMADol, traZODone  Assessment/ Plan:  Ms. Robin Rogers is a 79 y.o. hispanic female with congestive heart failure, hypertension, diabetes mellitus type 2, blindness, who is admitted to [facility] on 07/22/2019 for Hyponatremia [E87.1] Chronic anemia [D64.9] Generalized weakness [R53.1]  1. End stage renal disease. - consult vascular for tunneled catheter - first dialysis for tomorrow.   2. Anemia of chronic kidney disease : status post PRBC transfusion on 7/17  3. Hyponatremia: secondary to congestive heart failure and renal failure. Volume overloaded - Furosemide IV  4. Secondary Hyperparathyroidism: PTH 200 on 5/9. With hypocalcemia. Not currently on binders or  vitamin D agents.   5. Hypertension:  - carvedilol, hydralazine and IV furosemide.    LOS: 2 Robin Rogers 7/18/20211:13 PM

## 2019-07-24 NOTE — Progress Notes (Signed)
PROGRESS NOTE    Robin Rogers  TMH:[id number] DOB: 1940/12/04 DOA: 07/22/2019 PCP: [facility] [place]   Brief Narrative:  79 y/o F w/ PMH of CKDIV, HTN, ACD, HLD, CHF, GERD who presents w/ weakness & no appetite x 1 week. Pt has had multiple falls at home w/ most recently being day prior to admission. Pt denies hitting her head or LOC. Pt c/o feeling full despite not having an appetite. Pt also c/o nausea but no vomiting.  Patient is a [name] and needs interpreter. Patient was seen by [facility] on 5/24 where dialysis was discussed. Patient states she would do dialysis only if it was needed. Patient was then admitted to [facility] from 5/24 to 5/28. She deferred starting dialysis on that admission.  On arrival she had sodium of 120 and admitted for hyponatremia.  Subjective: Patient has no new complaint today.  She was accompanied by her daughter and husband in the room.  Sitting comfortably at the site of bed.  They are leaning towards starting dialysis.  Daughter has a lot of questions about the procedure which I deferred to nephrology.  Patient is a [name] and daughter helps with interpretation today.  Assessment & Plan:   Active Problems:   Hyponatremia   CKD (chronic kidney disease), stage V (HCC)   Acute on chronic HFrEF (heart failure with reduced ejection fraction) (HCC)   Chronic anemia  Hyponatremia.  Seems chronic most likely secondary to volume overload with worsening renal status.  Hyponatremia labs consistent with hypotonic hyponatremia secondary to renal failure. On exam she appears volume overload with history of HFrEF and worsening renal status. -Continue IV Lasix. -Nephrology consult. -Check TSH-pending results. -Continue to monitor  CKD stage V.  BUN/creatinine appears to be at her baseline.  Loss of appetite and nausea can be due to uremic symptoms.  Seems like patient is approaching for dialysis.  Nephrology was  consulted , patient and family is leaning towards starting of dialysis.  HD catheter to be placed tomorrow per nephrology. -Continue to monitor. -Palliative care consult.  HFrEF.  Prior echo with EF of 40 to 45% in May 2021.  BMP checked today as she appears volume of was more than 4000.  Was given some IV fluid overnight.  No proper intake and output recorded in chart.  Weight increased to 158 pound, it was 150 on admission. -Increase IV Lasix to 60 mg twice daily. -Daily BMP. -Strict intake and output-asked nursing staff again. -Daily weight. -Continue home dose of carvedilol and statin.  Generalized weakness.  Most likely secondary to worsening renal function, hyponatremia and HFrEF. -PT/OT evaluation.  Anemia.  Patient was given 1 unit of PRBC with hemoglobin of 8.9, resulted in improvement to 8.4 posttransfusion.  Dropped to 7.1 again today.  No obvious bleeding.  Anemia panel consistent with anemia of chronic disease. -Recheck hemoglobin to rule out lab error. -CT abdomen without contrast to rule out any conspicuous bleeding. -Check FOBT-should not have that much drop without any obvious bleeding and nursing staff have not noticed any.  Patient is blind and unable to tell the color of her stool. -Might need Aranesp-ask nephrology to start her on it. -Transfuse with 1 unit PRBC-if repeat hemoglobin remained low. -Continue to monitor  Hypertension.  Blood pressure within goal. -Continue home dose of carvedilol. -Continue home dose of hydralazine. -Continue Lasix  Hyperlipidemia. -Continue statin  GERD. -Continue PPI  Objective: Vitals:   07/23/19 1930 07/24/19 0017  07/24/19 0500 07/24/19 0812  BP: (!) 135/51 (!) 119/47  (!) 153/58  Pulse: (!) 57 (!) 55  (!) 59  Resp: 18 15  14   Temp: 98 F (36.7 C) 98 F (36.7 C)  98.7 F (37.1 C)  TempSrc: Oral Oral    SpO2: 100% 100%  98%  Weight:   71.9 kg   Height:        Intake/Output Summary (Last 24 hours) at 07/24/2019  1137 Last data filed at 07/23/2019 1930 Gross per 24 hour  Intake 338 ml  Output --  Net 338 ml   Filed Weights   07/22/19 1205 07/24/19 0500  Weight: 68 kg 71.9 kg    Examination:  General.  Well-developed, blind lady,   in no acute distress. Pulmonary.  Lungs clear bilaterally, normal respiratory effort. CV.  Regular rate and rhythm, no JVD, rub or murmur. Abdomen.  Soft, nontender, nondistended, BS positive. Extremities.  2+ LE edema, no cyanosis, pulses intact and symmetrical. Psychiatry.  Judgment and insight appears normal.  DVT prophylaxis: Heparin Code Status: Full Family Communication: Daughter and husband were updated at bedside. Disposition Plan:  Status is: Inpatient  Remains inpatient appropriate because:Inpatient level of care appropriate due to severity of illness   Dispo: The patient is from: Home              Anticipated d/c is to: Home              Anticipated d/c date is: 2-3 days              Patient currently is not medically stable to d/c.  Patient with advanced renal disease, now approaching dialysis.  Patient is leaning towards starting the dialysis.  Plan is to place HD catheter tomorrow and then start dialysis. She will need clip procedure to complete before discharge.  Patient is high risk for deterioration due to multiple comorbidities.  Palliative care has been consulted.  Consultants:   Nephrology  Procedures:  Antimicrobials:   Data Reviewed: I have personally reviewed following labs and imaging studies  CBC: Recent Labs  Lab 07/22/19 1153 07/23/19 0530 07/23/19 2043 07/24/19 0333  WBC 4.8 4.6  --  3.8*  HGB 7.5* 6.9* 8.4* 7.1*  HCT 22.8* 20.1* 24.6* 21.3*  MCV 95.4 92.2  --  94.2  PLT 205 182  --  948   Basic Metabolic Panel: Recent Labs  Lab 07/22/19 1153 07/22/19 1153 07/22/19 1642 07/22/19 2206 07/23/19 0530 07/23/19 0902 07/24/19 0333  NA 120*   < > 123* 122* 124*  125* 124* 124*  K 4.5  --   --   --  4.4  --   4.4  CL 89*  --   --   --  93*  --  95*  CO2 23  --   --   --  23  --  23  GLUCOSE 186*  --   --   --  85  --  75  BUN 50*  --   --   --  47*  --  48*  CREATININE 4.66*  --   --   --  4.46*  --  4.45*  CALCIUM 7.6*  --   --   --  7.6*  --  7.1*   < > = values in this interval not displayed.   GFR: Estimated Creatinine Clearance: 9.8 mL/min (A) (by C-G formula based on SCr of 4.45 mg/dL (H)). Liver Function Tests: No results for input(s):  AST, ALT, ALKPHOS, BILITOT, PROT, ALBUMIN in the last 168 hours. No results for input(s): LIPASE, AMYLASE in the last 168 hours. No results for input(s): AMMONIA in the last 168 hours. Coagulation Profile: No results for input(s): INR, PROTIME in the last 168 hours. Cardiac Enzymes: No results for input(s): CKTOTAL, CKMB, CKMBINDEX, TROPONINI in the last 168 hours. BNP (last 3 results) No results for input(s): PROBNP in the last 8760 hours. HbA1C: Recent Labs    07/23/19 0530  HGBA1C 5.6   CBG: Recent Labs  Lab 07/23/19 0753 07/23/19 1223 07/23/19 1626 07/23/19 2133 07/24/19 0811  GLUCAP 80 144* 99 125* 81   Lipid Profile: No results for input(s): CHOL, HDL, LDLCALC, TRIG, CHOLHDL, LDLDIRECT in the last 72 hours. Thyroid Function Tests: No results for input(s): TSH, T4TOTAL, FREET4, T3FREE, THYROIDAB in the last 72 hours. Anemia Panel: Recent Labs    07/23/19 0529 07/23/19 0902  VITAMINB12  --  391  FOLATE  --  19.3  FERRITIN  --  248  TIBC  --  190*  IRON  --  23*  RETICCTPCT 1.6  --    Sepsis Labs: No results for input(s): PROCALCITON, LATICACIDVEN in the last 168 hours.  Recent Results (from the past 240 hour(s))  SARS Coronavirus 2 by RT PCR (hospital order, performed in [facility] hospital lab) Nasopharyngeal     Status: None   Collection Time: 07/22/19  2:57 PM   Specimen: Nasopharyngeal  Result Value Ref Range Status   SARS Coronavirus 2 NEGATIVE NEGATIVE Final    Comment: (NOTE) SARS-CoV-2 target nucleic acids  are NOT DETECTED.  The SARS-CoV-2 RNA is generally detectable in upper and lower respiratory specimens during the acute phase of infection. The lowest concentration of SARS-CoV-2 viral copies this assay can detect is 250 copies / mL. A negative result does not preclude SARS-CoV-2 infection and should not be used as the sole basis for treatment or other patient management decisions.  A negative result may occur with improper specimen collection / handling, submission of specimen other than nasopharyngeal swab, presence of viral mutation(s) within the areas targeted by this assay, and inadequate number of viral copies (<250 copies / mL). A negative result must be combined with clinical observations, patient history, and epidemiological information.  Fact Sheet for Patients:   StrictlyIdeas.no  Fact Sheet for Healthcare Providers: BankingDealers.co.za  This test is not yet approved or  cleared by the [place] FDA and has been authorized for detection and/or diagnosis of SARS-CoV-2 by FDA under an Emergency Use Authorization (EUA).  This EUA will remain in effect (meaning this test can be used) for the duration of the COVID-19 declaration under Section 564(b)(1) of the Act, 21 U.S.C. section 360bbb-3(b)(1), unless the authorization is terminated or revoked sooner.  Performed at [facility], [address] [place], [place] [address]      Radiology Studies: CT ABDOMEN PELVIS WO CONTRAST  Result Date: 07/22/2019 CLINICAL DATA:  Abdominal pain, weakness, anorexia, fell yesterday EXAM: CT ABDOMEN AND PELVIS WITHOUT CONTRAST TECHNIQUE: Multidetector CT imaging of the abdomen and pelvis was performed following the standard protocol without IV contrast. COMPARISON:  09/22/2017 FINDINGS: Lower chest: There is chronic bilateral pleural thickening, with pleural calcification at the right costophrenic angle. Stable small pericardial  effusion. Hepatobiliary: No focal liver abnormality is seen. Status post cholecystectomy. No biliary dilatation. Pancreas: Unremarkable. No pancreatic ductal dilatation or surrounding inflammatory changes. Spleen: Normal in size without focal abnormality. Adrenals/Urinary Tract: No urinary tract calculi or  obstructive uropathy. There is bilateral renal cortical atrophy. Bladder is unremarkable. Stable adrenal glands. Stomach/Bowel: No bowel obstruction or ileus. Normal appendix right lower quadrant. No bowel wall thickening or inflammatory change. Vascular/Lymphatic: Aortic atherosclerosis. No enlarged abdominal or pelvic lymph nodes. Reproductive: Uterus and bilateral adnexa are unremarkable. Other: There is diffuse body wall edema greatest in the bilateral flanks. No free fluid or free gas within the peritoneal cavity. No abdominal wall hernia. Musculoskeletal: No acute or destructive bony lesions. Reconstructed images demonstrate no additional findings. IMPRESSION: 1. No acute intra-abdominal or intrapelvic process. 2. Diffuse body wall edema. 3. Aortic Atherosclerosis (ICD10-I70.0). Electronically Signed   By: Randa Ngo M.D.   On: 07/22/2019 15:27    Scheduled Meds: . sodium chloride   Intravenous Once  . aspirin EC  81 mg Oral Daily  . carvedilol  6.25 mg Oral BID WC  . feeding supplement (ENSURE ENLIVE)  237 mL Oral BID BM  . ferrous sulfate  325 mg Oral BID WC  . furosemide  40 mg Intravenous BID  . heparin  5,000 Units Subcutaneous Q8H  . hydrALAZINE  10 mg Oral TID  . insulin aspart  0-9 Units Subcutaneous TID WC  . multivitamin  1 tablet Oral QHS  . pantoprazole  40 mg Oral Daily  . pravastatin  40 mg Oral q1800  . sodium chloride flush  3 mL Intravenous Once  . sodium chloride flush  3 mL Intravenous Q12H  . traZODone  50 mg Oral QHS   Continuous Infusions:   LOS: 2 days   Time spent: 40 minutes.  Reviewed her imaging and chart personally.  More than 50% of time was spent with  direct patient care and planning.  Lorella Nimrod, MD [facility]  If 7PM-7AM, please contact night-coverage [email or web address]  07/24/2019, 11:37 AM   This record has been created using Systems analyst. Errors have been sought and corrected,but may not always be located. Such creation errors do not reflect on the standard of care.

## 2019-07-25 ENCOUNTER — Encounter
Admission: EM | Disposition: A | Discharge status: Home / Self Care | Payer: Self-pay | Source: Home / Self Care | Attending: Internal Medicine

## 2019-07-25 ENCOUNTER — Encounter: Payer: Self-pay | Admitting: Internal Medicine

## 2019-07-25 DIAGNOSIS — N185 Chronic kidney disease, stage 5: Secondary | ICD-10-CM

## 2019-07-25 HISTORY — PX: DIALYSIS/PERMA CATHETER INSERTION: CATH118288

## 2019-07-25 LAB — BASIC METABOLIC PANEL
Anion gap: 6 (ref 5–15)
BUN: 49 mg/dL — ABNORMAL HIGH (ref 8–23)
CO2: 24 mmol/L (ref 22–32)
Calcium: 7.1 mg/dL — ABNORMAL LOW (ref 8.9–10.3)
Chloride: 93 mmol/L — ABNORMAL LOW (ref 98–111)
Creatinine, Ser: 4.74 mg/dL — ABNORMAL HIGH (ref 0.44–1.00)
GFR calc Af Amer: 10 mL/min — ABNORMAL LOW (ref >60)
GFR calc non Af Amer: 8 mL/min — ABNORMAL LOW (ref >60)
Glucose, Bld: 86 mg/dL (ref 70–99)
Potassium: 4.7 mmol/L (ref 3.5–5.1)
Sodium: 123 mmol/L — ABNORMAL LOW (ref 135–145)

## 2019-07-25 LAB — CBC
HCT: 21.6 % — ABNORMAL LOW (ref 36.0–46.0)
Hemoglobin: 7.5 g/dL — ABNORMAL LOW (ref 12.0–15.0)
MCH: 31.4 pg (ref 26.0–34.0)
MCHC: 34.7 g/dL (ref 30.0–36.0)
MCV: 90.4 fL (ref 80.0–100.0)
Platelets: 163 10*3/uL (ref 150–400)
RBC: 2.39 MIL/uL — ABNORMAL LOW (ref 3.87–5.11)
RDW: 13.4 % (ref 11.5–15.5)
WBC: 4 10*3/uL (ref 4.0–10.5)
nRBC: 0 % (ref 0.0–0.2)

## 2019-07-25 LAB — GLUCOSE, CAPILLARY
Glucose-Capillary: 94 mg/dL (ref 70–99)
Glucose-Capillary: 95 mg/dL (ref 70–99)
Glucose-Capillary: 109 mg/dL — ABNORMAL HIGH (ref 70–99)
Glucose-Capillary: 109 mg/dL — ABNORMAL HIGH (ref 70–99)
Glucose-Capillary: 164 mg/dL — ABNORMAL HIGH (ref 70–99)

## 2019-07-25 LAB — HEPATITIS B SURFACE ANTIGEN: Hepatitis B Surface Ag: NONREACTIVE

## 2019-07-25 LAB — HEPATITIS B CORE ANTIBODY, IGM: Hep B C IgM: NONREACTIVE

## 2019-07-25 LAB — HEPATITIS B SURFACE ANTIBODY,QUALITATIVE: Hep B S Ab: NONREACTIVE

## 2019-07-25 SURGERY — DIALYSIS/PERMA CATHETER INSERTION
Anesthesia: Moderate Sedation

## 2019-07-25 MED ORDER — CEFAZOLIN SODIUM-DEXTROSE 1-4 GM/50ML-% IV SOLN
INTRAVENOUS | Status: AC
  Filled 2019-07-25: qty 50

## 2019-07-25 MED ORDER — FENTANYL CITRATE (PF) 100 MCG/2ML IJ SOLN
INTRAMUSCULAR | Status: AC
  Filled 2019-07-25: qty 2

## 2019-07-25 MED ORDER — MIDAZOLAM HCL 2 MG/2ML IJ SOLN
INTRAMUSCULAR | Status: DC | PRN
  Administered 2019-07-25: 1 mg via INTRAVENOUS

## 2019-07-25 MED ORDER — CEFAZOLIN SODIUM-DEXTROSE 1-4 GM/50ML-% IV SOLN
1.0000 g | INTRAVENOUS | Status: AC
  Administered 2019-07-25: 1 g via INTRAVENOUS
  Filled 2019-07-25: qty 50

## 2019-07-25 MED ORDER — CHLORHEXIDINE GLUCONATE CLOTH 2 % EX PADS
6.0000 | MEDICATED_PAD | Freq: Every day | CUTANEOUS | Status: DC
  Administered 2019-07-26 – 2019-07-27 (×2): 6 via TOPICAL

## 2019-07-25 MED ORDER — FENTANYL CITRATE (PF) 100 MCG/2ML IJ SOLN
INTRAMUSCULAR | Status: DC | PRN
  Administered 2019-07-25: 50 ug via INTRAVENOUS

## 2019-07-25 MED ORDER — SODIUM CHLORIDE 0.9 % IV SOLN: INTRAVENOUS | Status: DC

## 2019-07-25 MED ORDER — MIDAZOLAM HCL 5 MG/5ML IJ SOLN
INTRAMUSCULAR | Status: AC
  Filled 2019-07-25: qty 5

## 2019-07-25 SURGICAL SUPPLY — 10 items
BIOPATCH RED 1 DISK 7.0 (GAUZE/BANDAGES/DRESSINGS) ×1 IMPLANT
BIOPATCH RED 1IN DISK 7.0MM (GAUZE/BANDAGES/DRESSINGS) ×1
CATH CANNON HEMO 15FR 19 (HEMODIALYSIS SUPPLIES) ×2 IMPLANT
DERMABOND ADVANCED (GAUZE/BANDAGES/DRESSINGS) ×2
DERMABOND ADVANCED .7 DNX12 (GAUZE/BANDAGES/DRESSINGS) IMPLANT
PACK ANGIOGRAPHY (CUSTOM PROCEDURE TRAY) ×2 IMPLANT
SUT MNCRL 4-0 (SUTURE) ×4
SUT MNCRL 4-0 27XMFL (SUTURE) ×2
SUT PROLENE 0 CT 1 30 (SUTURE) ×2 IMPLANT
SUTURE MNCRL 4-0 27XMF (SUTURE) IMPLANT

## 2019-07-25 NOTE — Op Note (Signed)
OPERATIVE NOTE    PRE-OPERATIVE DIAGNOSIS: 1. ESRD   POST-OPERATIVE DIAGNOSIS: same as above  PROCEDURE: 1. Ultrasound guidance for vascular access to the left internal jugular vein 2. Fluoroscopic guidance for placement of catheter 3. Placement of a 19 cm tip to cuff tunneled hemodialysis catheter via the left internal jugular vein  SURGEON: Leotis Pain, MD  ANESTHESIA:  Local with Moderate conscious sedation for approximately 20 minutes using 2 mg of Versed and 50 mcg of Fentanyl  ESTIMATED BLOOD LOSS: 25 cc  FLUORO TIME: less than one minute  CONTRAST: none  FINDING(S): 1.  Patent left internal jugular vein  SPECIMEN(S):  None  INDICATIONS:   Robin Rogers is a 79 y.o. female who presents with renal failure.  The patient needs long term dialysis access for their ESRD, and a Permcath is necessary.  Risks and benefits are discussed and informed consent is obtained.    DESCRIPTION: After obtaining full informed written consent, the patient was brought back to the vascular suited. The patient's left neck and chest were sterilely prepped and draped in a sterile surgical field was created. Moderate conscious sedation was administered during a face to face encounter with the patient throughout the procedure with my supervision of the RN administering medicines and monitoring the patient's vital signs, pulse oximetry, telemetry and mental status throughout from the start of the procedure until the patient was taken to the recovery room.  The left internal jugular vein was visualized with ultrasound and found to be patent. It was then accessed under direct ultrasound guidance and a permanent image was recorded. A wire was placed. After skin nick and dilatation, the peel-away sheath was placed over the wire. I then turned my attention to an area under the clavicle. Approximately 1-2 fingerbreadths below the clavicle a small counterincision was created and tunneled from the subclavicular  incision to the access site. Using fluoroscopic guidance, a 19 centimeter tip to cuff tunneled hemodialysis catheter was selected, and tunneled from the subclavicular incision to the access site. It was then placed through the peel-away sheath and the peel-away sheath was removed. Using fluoroscopic guidance the catheter tips were parked in the right atrium. The appropriate distal connectors were placed. It withdrew blood well and flushed easily with heparinized saline and a concentrated heparin solution was then placed. It was secured to the chest wall with 2 Prolene sutures. The access incision was closed single 4-0 Monocryl. A 4-0 Monocryl pursestring suture was placed around the exit site. Sterile dressings were placed. The patient tolerated the procedure well and was taken to the recovery room in stable condition.  COMPLICATIONS: None  CONDITION: Stable  Leotis Pain  07/25/2019, 11:16 AM   This note was created with Dragon Medical transcription system. Any errors in dictation are purely unintentional.

## 2019-07-25 NOTE — Progress Notes (Signed)
PROGRESS NOTE    Robin Rogers  HWE:[id number] DOB: May 14, 1940 DOA: 07/22/2019 PCP: [facility]   Brief Narrative:  79 y/o F w/ PMH of CKDIV, HTN, ACD, HLD, CHF, GERD who presents w/ weakness & no appetite x 1 week. Pt has had multiple falls at home w/ most recently being day prior to admission. Pt denies hitting her head or LOC. Pt c/o feeling full despite not having an appetite. Pt also c/o nausea but no vomiting.  Patient is a [name] and needs interpreter. Patient was seen by [facility] on 5/24 where dialysis was discussed. Patient states she would do dialysis only if it was needed. Patient was then admitted to [facility] from 5/24 to 5/28. She deferred starting dialysis on that admission.  On arrival she had sodium of 120 and admitted for hyponatremia.  Subjective: Patient has no new complaint today.  Resting comfortably in bed.  Waiting for her placement of HD catheter followed by start of dialysis later today.  Assessment & Plan:   Active Problems:   Hyponatremia   CKD (chronic kidney disease), stage V (HCC)   Acute on chronic HFrEF (heart failure with reduced ejection fraction) (HCC)   Chronic anemia  Hyponatremia.  Seems chronic most likely secondary annual disease as hyponatremia labs are consistent with that. On exam she appears volume overload with history of HFrEF and worsening renal status. -Continue IV Lasix. -Nephrology consult. -Check TSH-within normal limit. -Continue to monitor  CKD stage V.  BUN/creatinine appears to be at her baseline.  Loss of appetite and nausea can be due to uremic symptoms.  Seems like patient is approaching for dialysis.  Nephrology was consulted , patient and family is leaning towards starting of dialysis.  Patient is scheduled for HD catheter placement followed by start of hemodialysis by nephrology today. -Continue to monitor. -Palliative care consult.  HFrEF.  Prior echo with EF of 40 to 45% in  May 2021.  BNP  more than 4000.  Was given some IV fluid overnight on admission.  No proper intake and output recorded in chart.  Weight increased to 158 pound, it was 150 on admission.  Remained stable today. -Continue IV Lasix to 60 mg twice daily. -Daily BMP. -Strict intake and output-asked nursing staff again. -Daily weight. -Continue home dose of carvedilol and statin.  Generalized weakness.  Most likely secondary to worsening renal function, hyponatremia and HFrEF. -PT/OT evaluation.  Anemia.  Patient was given 1 unit of PRBC with hemoglobin of 6.9, resulted in improvement to 8.4 posttransfusion.  Dropped to 7.5 again today.  No obvious bleeding.  Anemia panel consistent with anemia of chronic disease. -CT abdomen without contrast to rule out any conspicuous bleeding was negative for any retroperitoneal bleed. -Check FOBT-ordered and still pending. -Might need Aranesp-ask nephrology to start her on it. -Continue to monitor -Transfuse below 7.  Hypertension.  Blood pressure mildly elevated. -Continue home dose of carvedilol. -Continue home dose of hydralazine. -Continue Lasix  Hyperlipidemia. -Continue statin  GERD. -Continue PPI  Objective: Vitals:   07/25/19 1145 07/25/19 1200 07/25/19 1204 07/25/19 1249  BP: (!) 167/56 (!) 159/51  (!) 176/61  Pulse: 63 61 60 67  Resp: 18 15 14 18   Temp:  98.5 F (36.9 C)  98.2 F (36.8 C)  TempSrc:  Oral  Oral  SpO2: 97% 97% 96% 100%  Weight:      Height:       No intake or output data in the  24 hours ending 07/25/19 1454 Filed Weights   07/22/19 1205 07/24/19 0500 07/25/19 0408  Weight: 68 kg 71.9 kg 71.8 kg    Examination:  General.  Well-developed, blind lady,   in no acute distress. Pulmonary.  Lungs clear bilaterally, normal respiratory effort. CV.  Regular rate and rhythm, no JVD, rub or murmur. Abdomen.  Soft, nontender, nondistended, BS positive. CNS.  Alert and oriented x3.  No focal neurologic  deficit. Extremities.  1+ LE edema, no cyanosis, pulses intact and symmetrical. Psychiatry.  Judgment and insight appears normal.  DVT prophylaxis: Heparin Code Status: Full Family Communication: No family at bedside Disposition Plan:  Status is: Inpatient  Remains inpatient appropriate because:Inpatient level of care appropriate due to severity of illness   Dispo: The patient is from: Home              Anticipated d/c is to: Home              Anticipated d/c date is: 2-3 days              Patient currently is not medically stable to d/c.  Patient with advanced renal disease, now approaching dialysis.  Patient was started on dialysis today will take few days to complete a clip process before discharge.  Patient is high risk for deterioration due to multiple comorbidities.  Palliative care has been consulted.  Consultants:   Nephrology  Procedures:  Antimicrobials:   Data Reviewed: I have personally reviewed following labs and imaging studies  CBC: Recent Labs  Lab 07/22/19 1153 07/22/19 1153 07/23/19 0530 07/23/19 2043 07/24/19 0333 07/24/19 1211 07/25/19 0441  WBC 4.8  --  4.6  --  3.8*  --  4.0  HGB 7.5*   < > 6.9* 8.4* 7.1* 8.0* 7.5*  HCT 22.8*   < > 20.1* 24.6* 21.3* 23.0* 21.6*  MCV 95.4  --  92.2  --  94.2  --  90.4  PLT 205  --  182  --  160  --  163   < > = values in this interval not displayed.   Basic Metabolic Panel: Recent Labs  Lab 07/22/19 1153 07/22/19 1642 07/22/19 2206 07/23/19 0530 07/23/19 0902 07/24/19 0333 07/25/19 0441  NA 120*   < > 122* 124*  125* 124* 124* 123*  K 4.5  --   --  4.4  --  4.4 4.7  CL 89*  --   --  93*  --  95* 93*  CO2 23  --   --  23  --  23 24  GLUCOSE 186*  --   --  85  --  75 86  BUN 50*  --   --  47*  --  48* 49*  CREATININE 4.66*  --   --  4.46*  --  4.45* 4.74*  CALCIUM 7.6*  --   --  7.6*  --  7.1* 7.1*   < > = values in this interval not displayed.   GFR: Estimated Creatinine Clearance: 9.2 mL/min (A)  (by C-G formula based on SCr of 4.74 mg/dL (H)). Liver Function Tests: No results for input(s): AST, ALT, ALKPHOS, BILITOT, PROT, ALBUMIN in the last 168 hours. No results for input(s): LIPASE, AMYLASE in the last 168 hours. No results for input(s): AMMONIA in the last 168 hours. Coagulation Profile: No results for input(s): INR, PROTIME in the last 168 hours. Cardiac Enzymes: No results for input(s): CKTOTAL, CKMB, CKMBINDEX, TROPONINI in the last 168 hours.  BNP (last 3 results) No results for input(s): PROBNP in the last 8760 hours. HbA1C: Recent Labs    07/23/19 0530  HGBA1C 5.6   CBG: Recent Labs  Lab 07/24/19 1717 07/24/19 2121 07/25/19 0842 07/25/19 1029 07/25/19 1252  GLUCAP 134* 87 94 95 109*   Lipid Profile: No results for input(s): CHOL, HDL, LDLCALC, TRIG, CHOLHDL, LDLDIRECT in the last 72 hours. Thyroid Function Tests: Recent Labs    07/23/19 0902  TSH 1.310  T4TOTAL 9.1   Anemia Panel: Recent Labs    07/23/19 0529 07/23/19 0902  VITAMINB12  --  391  FOLATE  --  19.3  FERRITIN  --  248  TIBC  --  190*  IRON  --  23*  RETICCTPCT 1.6  --    Sepsis Labs: No results for input(s): PROCALCITON, LATICACIDVEN in the last 168 hours.  Recent Results (from the past 240 hour(s))  SARS Coronavirus 2 by RT PCR (hospital order, performed in [facility] hospital lab) Nasopharyngeal     Status: None   Collection Time: 07/22/19  2:57 PM   Specimen: Nasopharyngeal  Result Value Ref Range Status   SARS Coronavirus 2 NEGATIVE NEGATIVE Final    Comment: (NOTE) SARS-CoV-2 target nucleic acids are NOT DETECTED.  The SARS-CoV-2 RNA is generally detectable in upper and lower respiratory specimens during the acute phase of infection. The lowest concentration of SARS-CoV-2 viral copies this assay can detect is 250 copies / mL. A negative result does not preclude SARS-CoV-2 infection and should not be used as the sole basis for treatment or other patient management  decisions.  A negative result may occur with improper specimen collection / handling, submission of specimen other than nasopharyngeal swab, presence of viral mutation(s) within the areas targeted by this assay, and inadequate number of viral copies (<250 copies / mL). A negative result must be combined with clinical observations, patient history, and epidemiological information.  Fact Sheet for Patients:   StrictlyIdeas.no  Fact Sheet for Healthcare Providers: BankingDealers.co.za  This test is not yet approved or  cleared by the [place] FDA and has been authorized for detection and/or diagnosis of SARS-CoV-2 by FDA under an Emergency Use Authorization (EUA).  This EUA will remain in effect (meaning this test can be used) for the duration of the COVID-19 declaration under Section 564(b)(1) of the Act, 21 U.S.C. section 360bbb-3(b)(1), unless the authorization is terminated or revoked sooner.  Performed at [facility], [address], [place], [place] [address]      Radiology Studies: CT ABDOMEN PELVIS WO CONTRAST  Result Date: 07/24/2019 CLINICAL DATA:  Anemia.  Multiple recent falls.  Nausea. EXAM: CT ABDOMEN AND PELVIS WITHOUT CONTRAST TECHNIQUE: Multidetector CT imaging of the abdomen and pelvis was performed following the standard protocol without IV contrast. COMPARISON:  07/22/2019 FINDINGS: Lower chest: Stable enlarged heart and moderate-sized pericardial effusion. Small bilateral pleural effusions, larger on the left and new on the right. 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 Hepatobiliary: No focal liver abnormality is seen. Status post cholecystectomy. No biliary dilatation. Pancreas: Unremarkable. No pancreatic ductal dilatation or surrounding inflammatory changes. Spleen: Normal in size without focal abnormality. Adrenals/Urinary Tract: Adrenal glands are unremarkable. Kidneys are normal, without renal  calculi, focal lesion, or hydronephrosis. Bladder is unremarkable. Stomach/Bowel: Stomach is within normal limits. Appendix appears normal. No evidence of bowel wall thickening, distention, or inflammatory changes. Vascular/Lymphatic: Atheromatous arterial calcifications without aneurysm. No enlarged lymph nodes. Reproductive: Uterus and bilateral adnexa are unremarkable. Other:  Diffuse subcutaneous edema with improvement. Musculoskeletal: Lumbar spine degenerative changes and mild scoliosis. IMPRESSION: 1. No acute abnormality. 2. Small bilateral pleural effusions, larger on the left and new on the right. 3. Stable cardiomegaly and moderate-sized pericardial effusion. 4. 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 Electronically Signed   By: Claudie Revering M.D.   On: 07/24/2019 16:06   PERIPHERAL VASCULAR CATHETERIZATION  Result Date: 07/25/2019 See op note   Scheduled Meds: . sodium chloride   Intravenous Once  . aspirin EC  81 mg Oral Daily  . carvedilol  6.25 mg Oral BID WC  . [START ON 07/26/2019] Chlorhexidine Gluconate Cloth  6 each Topical Q0600  . docusate sodium  100 mg Oral BID  . feeding supplement (ENSURE ENLIVE)  237 mL Oral BID BM  . fentaNYL      . ferrous sulfate  325 mg Oral BID WC  . furosemide  40 mg Intravenous BID  . heparin  5,000 Units Subcutaneous Q8H  . hydrALAZINE  10 mg Oral TID  . insulin aspart  0-9 Units Subcutaneous TID WC  . midazolam      . multivitamin  1 tablet Oral QHS  . pantoprazole  40 mg Oral Daily  . pravastatin  40 mg Oral q1800  . sodium chloride flush  3 mL Intravenous Once  . sodium chloride flush  3 mL Intravenous Q12H  . traZODone  50 mg Oral QHS   Continuous Infusions: . ceFAZolin       LOS: 3 days   Time spent: 35 minutes.  More than 50% of time was spent with direct patient care and planning.  Lorella Nimrod, MD [facility]  If 7PM-7AM, please contact  night-coverage [email or web address]  07/25/2019, 2:54 PM   This record has been created using Systems analyst. Errors have been sought and corrected,but may not always be located. Such creation errors do not reflect on the standard of care.

## 2019-07-25 NOTE — Progress Notes (Signed)
[facility]  ROUNDING NOTE   Subjective:   Patient speaks [place] dialect.   LIJ permcath placed today by Dr. Lucky Cowboy.   First hemodialysis treatment today.   Objective:  Vital signs in last 24 hours:  Temp:  [98 F (36.7 C)-98.5 F (36.9 C)] 98.2 F (36.8 C) (07/19 1249) Pulse Rate:  [55-67] 67 (07/19 1249) Resp:  [8-21] 18 (07/19 1249) BP: (118-182)/(40-115) 176/61 (07/19 1249) SpO2:  [96 %-100 %] 100 % (07/19 1249) Weight:  [71.8 kg] 71.8 kg (07/19 0408)  Weight change: -0.095 kg Filed Weights   07/22/19 1205 07/24/19 0500 07/25/19 0408  Weight: 68 kg 71.9 kg 71.8 kg    Intake/Output: I/O last 3 completed shifts: In: 338 [Blood:338] Out: -    Intake/Output this shift:  No intake/output data recorded.  Physical Exam: General: NAD, sitting up in bed  Head: Normocephalic, atraumatic. Moist oral mucosal membranes  Eyes: +blind  Neck: Supple, trachea midline  Lungs:  Basilar crackles  Heart: Regular rate and rhythm  Abdomen:  Soft, nontender,   Extremities:  trace peripheral edema.  Neurologic: Nonfocal, moving all four extremities  Skin: No lesions  Access: LIJ permcath 7/19 Dr. Lucky Cowboy    Basic Metabolic Panel: Recent Labs  Lab 07/22/19 1153 07/22/19 1153 07/22/19 1642 07/22/19 2206 07/23/19 0530 07/23/19 0902 07/24/19 0333 07/25/19 0441  NA 120*  --    < > 122* 124*  125* 124* 124* 123*  K 4.5  --   --   --  4.4  --  4.4 4.7  CL 89*  --   --   --  93*  --  95* 93*  CO2 23  --   --   --  23  --  23 24  GLUCOSE 186*  --   --   --  85  --  75 86  BUN 50*  --   --   --  47*  --  48* 49*  CREATININE 4.66*  --   --   --  4.46*  --  4.45* 4.74*  CALCIUM 7.6*   < >  --   --  7.6*  --  7.1* 7.1*   < > = values in this interval not displayed.    Liver Function Tests: No results for input(s): AST, ALT, ALKPHOS, BILITOT, PROT, ALBUMIN in the last 168 hours. No results for input(s): LIPASE, AMYLASE in the last 168 hours. No results for  input(s): AMMONIA in the last 168 hours.  CBC: Recent Labs  Lab 07/22/19 1153 07/22/19 1153 07/23/19 0530 07/23/19 2043 07/24/19 0333 07/24/19 1211 07/25/19 0441  WBC 4.8  --  4.6  --  3.8*  --  4.0  HGB 7.5*   < > 6.9* 8.4* 7.1* 8.0* 7.5*  HCT 22.8*   < > 20.1* 24.6* 21.3* 23.0* 21.6*  MCV 95.4  --  92.2  --  94.2  --  90.4  PLT 205  --  182  --  160  --  163   < > = values in this interval not displayed.    Cardiac Enzymes: No results for input(s): CKTOTAL, CKMB, CKMBINDEX, TROPONINI in the last 168 hours.  BNP: Invalid input(s): POCBNP  CBG: Recent Labs  Lab 07/24/19 1717 07/24/19 2121 07/25/19 0842 07/25/19 1029 07/25/19 1252  GLUCAP 134* 87 94 95 109*    Microbiology: Results for orders placed or performed during the hospital encounter of 07/22/19  SARS Coronavirus 2 by RT PCR (hospital  order, performed in [facility] hospital lab) Nasopharyngeal     Status: None   Collection Time: 07/22/19  2:57 PM   Specimen: Nasopharyngeal  Result Value Ref Range Status   SARS Coronavirus 2 NEGATIVE NEGATIVE Final    Comment: (NOTE) SARS-CoV-2 target nucleic acids are NOT DETECTED.  The SARS-CoV-2 RNA is generally detectable in upper and lower respiratory specimens during the acute phase of infection. The lowest concentration of SARS-CoV-2 viral copies this assay can detect is 250 copies / mL. A negative result does not preclude SARS-CoV-2 infection and should not be used as the sole basis for treatment or other patient management decisions.  A negative result may occur with improper specimen collection / handling, submission of specimen other than nasopharyngeal swab, presence of viral mutation(s) within the areas targeted by this assay, and inadequate number of viral copies (<250 copies / mL). A negative result must be combined with clinical observations, patient history, and epidemiological information.  Fact Sheet for Patients:    StrictlyIdeas.no  Fact Sheet for Healthcare Providers: BankingDealers.co.za  This test is not yet approved or  cleared by the [place] FDA and has been authorized for detection and/or diagnosis of SARS-CoV-2 by FDA under an Emergency Use Authorization (EUA).  This EUA will remain in effect (meaning this test can be used) for the duration of the COVID-19 declaration under Section 564(b)(1) of the Act, 21 U.S.C. section 360bbb-3(b)(1), unless the authorization is terminated or revoked sooner.  Performed at [facility], [address] [place], [place] [address]     Coagulation Studies: No results for input(s): LABPROT, INR in the last 72 hours.  Urinalysis: Recent Labs    07/22/19 1501  COLORURINE YELLOW*  LABSPEC 1.008  PHURINE 5.0  GLUCOSEU 50*  HGBUR NEGATIVE  BILIRUBINUR NEGATIVE  KETONESUR NEGATIVE  PROTEINUR >=300*  NITRITE NEGATIVE  LEUKOCYTESUR NEGATIVE      Imaging: CT ABDOMEN PELVIS WO CONTRAST  Result Date: 07/24/2019 CLINICAL DATA:  Anemia.  Multiple recent falls.  Nausea. EXAM: CT ABDOMEN AND PELVIS WITHOUT CONTRAST TECHNIQUE: Multidetector CT imaging of the abdomen and pelvis was performed following the standard protocol without IV contrast. COMPARISON:  07/22/2019 FINDINGS: Lower chest: Stable enlarged heart and moderate-sized pericardial effusion. Small bilateral pleural effusions, larger on the left and new on the right. 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 Hepatobiliary: No focal liver abnormality is seen. Status post cholecystectomy. No biliary dilatation. Pancreas: Unremarkable. No pancreatic ductal dilatation or surrounding inflammatory changes. Spleen: Normal in size without focal abnormality. Adrenals/Urinary Tract: Adrenal glands are unremarkable. Kidneys are normal, without renal calculi, focal lesion, or hydronephrosis. Bladder is unremarkable. Stomach/Bowel: Stomach is within  normal limits. Appendix appears normal. No evidence of bowel wall thickening, distention, or inflammatory changes. Vascular/Lymphatic: Atheromatous arterial calcifications without aneurysm. No enlarged lymph nodes. Reproductive: Uterus and bilateral adnexa are unremarkable. Other: Diffuse subcutaneous edema with improvement. Musculoskeletal: Lumbar spine degenerative changes and mild scoliosis. IMPRESSION: 1. No acute abnormality. 2. Small bilateral pleural effusions, larger on the left and new on the right. 3. Stable cardiomegaly and moderate-sized pericardial effusion. 4. 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 Electronically Signed   By: Claudie Revering M.D.   On: 07/24/2019 16:06   PERIPHERAL VASCULAR CATHETERIZATION  Result Date: 07/25/2019 See op note    Medications:   . ceFAZolin     . sodium chloride   Intravenous Once  . aspirin EC  81 mg Oral Daily  . carvedilol  6.25 mg Oral BID WC  . [START ON 07/26/2019] Chlorhexidine Gluconate Cloth  6 each Topical Q0600  . docusate sodium  100 mg Oral BID  . feeding supplement (ENSURE ENLIVE)  237 mL Oral BID BM  . fentaNYL      . ferrous sulfate  325 mg Oral BID WC  . furosemide  40 mg Intravenous BID  . heparin  5,000 Units Subcutaneous Q8H  . hydrALAZINE  10 mg Oral TID  . insulin aspart  0-9 Units Subcutaneous TID WC  . midazolam      . multivitamin  1 tablet Oral QHS  . pantoprazole  40 mg Oral Daily  . pravastatin  40 mg Oral q1800  . sodium chloride flush  3 mL Intravenous Once  . sodium chloride flush  3 mL Intravenous Q12H  . traZODone  50 mg Oral QHS   acetaminophen **OR** acetaminophen, bisacodyl, morphine injection, ondansetron **OR** ondansetron (ZOFRAN) IV, traMADol, traZODone  Assessment/ Plan:  Robin Rogers is a 79 y.o. hispanic female with congestive heart failure, hypertension, diabetes mellitus type 2, blindness, who is admitted to [facility] on 07/22/2019 for  Hyponatremia [E87.1] Chronic anemia [D64.9] Generalized weakness [R53.1]  1. End stage renal disease. First dialysis today.   2. Anemia of chronic kidney disease : status post PRBC transfusion on 7/17 hemogloibn 7.5  3. Hyponatremia: secondary to congestive heart failure and renal failure. Volume overloaded - Furosemide IV  4. Secondary Hyperparathyroidism: PTH 200 on 5/9. With hypocalcemia. Not currently on binders or vitamin D agents.   5. Hypertension:  - carvedilol, hydralazine and IV furosemide.    LOS: 3 Robin Rogers 7/19/20212:39 PM

## 2019-07-25 NOTE — Progress Notes (Signed)
PT Cancellation Note  Patient Details Name: Robin Rogers MRN: [id number] DOB: 1940/09/15   Cancelled Treatment:    Reason Eval/Treat Not Completed: Other (comment) (Pt off the floor for HD at this time. PT to follow up as able.)   Lieutenant Diego PT, DPT 4:49 PM,07/25/19

## 2019-07-25 NOTE — H&P (Signed)
[facility] Vascular Consult Note  MRN : [id number]  Robin Rogers is a 79 y.o. (12/05/1940) female who presents with chief complaint of  Chief Complaint  Patient presents with  . Weakness  .  History of Present Illness: I am asked to see the patient by Dr. Juleen China for placement of a dialysis access.  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  She has poor energy.  She is very anxious.  She is not having any current pain.  History is obtained through the help of Spanish interpreter as the patient does not speak English  Current Facility-Administered Medications  Medication Dose Route Frequency Provider Last Rate Last Admin  . [MAR Hold] 0.9 %  sodium chloride infusion (Manually program via Guardrails IV Fluids)   Intravenous Once Amin, Soundra Pilon, MD      . 0.9 %  sodium chloride infusion   Intravenous Continuous Aveya Beal, Erskine Squibb, MD      . Doug Sou Hold] acetaminophen (TYLENOL) tablet 650 mg  650 mg Oral Q6H PRN Wyvonnia Dusky, MD   650 mg at 07/24/19 1956   Or  . Doug Sou Hold] acetaminophen (TYLENOL) suppository 650 mg  650 mg Rectal Q6H PRN Wyvonnia Dusky, MD      . Doug Sou Hold] aspirin EC tablet 81 mg  81 mg Oral Daily Wyvonnia Dusky, MD   81 mg at 07/24/19 1120  . [MAR Hold] bisacodyl (DULCOLAX) EC tablet 5 mg  5 mg Oral Daily PRN Wyvonnia Dusky, MD   5 mg at 07/24/19 2154  . [MAR Hold] carvedilol (COREG) tablet 6.25 mg  6.25 mg Oral BID WC Wyvonnia Dusky, MD   6.25 mg at 07/24/19 1805  . ceFAZolin (ANCEF) IVPB 1 g/50 mL premix  1 g Intravenous 30 min Pre-Op Algernon Huxley, MD      . Doug Sou Hold] docusate sodium (COLACE) capsule 100 mg  100 mg Oral BID Sharion Settler, NP   100 mg at 07/24/19 2154  . [MAR Hold] feeding supplement (ENSURE ENLIVE) (ENSURE ENLIVE) liquid 237 mL  237 mL Oral BID BM Lorella Nimrod, MD      . Doug Sou Hold] ferrous sulfate tablet 325 mg  325 mg  Oral BID WC Wyvonnia Dusky, MD   325 mg at 07/24/19 1805  . [MAR Hold] furosemide (LASIX) injection 40 mg  40 mg Intravenous BID Lorella Nimrod, MD   40 mg at 07/24/19 1807  . [MAR Hold] heparin injection 5,000 Units  5,000 Units Subcutaneous Q8H Wyvonnia Dusky, MD   5,000 Units at 07/24/19 2154  . [MAR Hold] hydrALAZINE (APRESOLINE) tablet 10 mg  10 mg Oral TID Wyvonnia Dusky, MD   10 mg at 07/24/19 2154  . [MAR Hold] insulin aspart (novoLOG) injection 0-9 Units  0-9 Units Subcutaneous TID WC Wyvonnia Dusky, MD   1 Units at 07/24/19 1806  . [MAR Hold] morphine 2 MG/ML injection 1 mg  1 mg Intravenous Q4H PRN Wyvonnia Dusky, MD      . Doug Sou Hold] multivitamin (RENA-VIT) tablet 1 tablet  1 tablet Oral QHS Lorella Nimrod, MD   1 tablet at 07/24/19 2154  . [MAR Hold] ondansetron (ZOFRAN) tablet 4 mg  4 mg Oral Q6H PRN Wyvonnia Dusky, MD       Or  . Doug Sou Hold] ondansetron [facility]) injection 4 mg  4 mg Intravenous Q6H PRN Eppie Gibson  M, MD      . Doug Sou Hold] pantoprazole (PROTONIX) EC tablet 40 mg  40 mg Oral Daily Wyvonnia Dusky, MD   40 mg at 07/24/19 1121  . [MAR Hold] pravastatin (PRAVACHOL) tablet 40 mg  40 mg Oral q1800 Wyvonnia Dusky, MD   40 mg at 07/24/19 1806  . [MAR Hold] sodium chloride flush (NS) 0.9 % injection 3 mL  3 mL Intravenous Once Carrie Mew, MD      . Doug Sou Hold] sodium chloride flush (NS) 0.9 % injection 3 mL  3 mL Intravenous Q12H Wyvonnia Dusky, MD   3 mL at 07/24/19 2159  . [MAR Hold] traMADol (ULTRAM) tablet 50 mg  50 mg Oral Q6H PRN Wyvonnia Dusky, MD      . Doug Sou Hold] traZODone (DESYREL) tablet 50 mg  50 mg Oral QHS PRN Lorella Nimrod, MD      . Doug Sou Hold] traZODone (DESYREL) tablet 50 mg  50 mg Oral QHS Sharion Settler, NP   50 mg at 07/24/19 2154    Past Medical History:  Diagnosis Date  . Anemia of chronic disease   . Blindness   . Chronic combined systolic (congestive) and diastolic (congestive) heart  failure ([place])    a. 09/2017 Echo: EF 45-50%, Gr2 DD; b. 05/2019 Echo: EF 40-45%, Gr III DD (restrictive), nl RV fxn, mod elev PASP. Mod dil LA. Mild MR.  . CKD (chronic kidney disease), stage IV ([place])   . Diet-controlled diabetes mellitus ([place])   . Hypertension     Past Surgical History:  Procedure Laterality Date  . CHOLECYSTECTOMY    . ESOPHAGOGASTRODUODENOSCOPY N/A 09/24/2017   Procedure: ESOPHAGOGASTRODUODENOSCOPY (EGD);  Surgeon: Toledo, Benay Pike, MD;  Location: [facility] ENDOSCOPY;  Service: Gastroenterology;  Laterality: N/A;    Social History   Tobacco Use  . Smoking status: Never Smoker  . Smokeless tobacco: Never Used  Vaping Use  . Vaping Use: Never used  Substance Use Topics  . Alcohol use: No  . Drug use: Never    Family History No bleeding disorders, clotting disorders, autoimmune diseases, or aneurysms  No Known Allergies   REVIEW OF SYSTEMS (Negative unless checked)  Constitutional: [] Weight loss  [] Fever  [] Chills Cardiac: [] Chest pain   [] Chest pressure   [] Palpitations   [] Shortness of breath when laying flat   [] Shortness of breath at rest   [] Shortness of breath with exertion. Vascular:  [] Pain in legs with walking   [] Pain in legs at rest   [] Pain in legs when laying flat   [] Claudication   [] Pain in feet when walking  [] Pain in feet at rest  [] Pain in feet when laying flat   [] History of DVT   [] Phlebitis   [] Swelling in legs   [] Varicose veins   [] Non-healing ulcers Pulmonary:   [] Uses home oxygen   [] Productive cough   [] Hemoptysis   [] Wheeze  [] COPD   [] Asthma Neurologic:  [] Dizziness  [] Blackouts   [] Seizures   [] History of stroke   [] History of TIA  [] Aphasia   [] Temporary blindness   [] Dysphagia   [] Weakness or numbness in arms   [] Weakness or numbness in legs X patient is blind Musculoskeletal:  [x] Arthritis   [] Joint swelling   [] Joint pain   [] Low back pain Hematologic:  [] Easy bruising  [] Easy bleeding   [] Hypercoagulable state   [x] Anemic   [] Hepatitis Gastrointestinal:  [] Blood in stool   [] Vomiting blood  [] Gastroesophageal reflux/heartburn   [] Difficulty swallowing. Genitourinary:  [x] Chronic kidney  disease   [] Difficult urination  [] Frequent urination  [] Burning with urination   [] Blood in urine Skin:  [] Rashes   [] Ulcers   [] Wounds Psychological:  [x] History of anxiety   []  History of major depression.  Physical Examination  Vitals:   07/25/19 0338 07/25/19 0408 07/25/19 0839 07/25/19 1019  BP: (!) 118/40  (!) 164/61   Pulse: (!) 57  66   Resp: 14  18   Temp: 98.2 F (36.8 C)  98.4 F (36.9 C) 98 F (36.7 C)  TempSrc: Oral  Oral Oral  SpO2: 100%  100%   Weight:  71.8 kg    Height:       Body mass index is 28.95 kg/m. Gen:  WD/WN, NAD Head: [place]/AT, No temporalis wasting. Ear/Nose/Throat: Hearing grossly intact, nares w/o erythema or drainage, oropharynx w/o Erythema/Exudate Eyes: Patient is blind.  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 Neck: Trachea midline.  No JVD.  Pulmonary:  Good air movement, respirations not labored, equal bilaterally.  Cardiac: RRR, normal S1, S2. Vascular:  Vessel Right Left  Radial Palpable Palpable                                    Musculoskeletal: M/S 5/5 throughout.  Extremities without ischemic changes.  No deformity or atrophy.  Mild lower extremity edema. Neurologic: Sensation grossly intact in extremities.  Symmetrical.  Speech is fluent. Motor exam as listed above. Psychiatric: Judgment intact, Mood & affect appropriate for pt's clinical situation. Dermatologic: No rashes or ulcers noted.  No cellulitis or open wounds.       CBC Lab Results  Component Value Date   WBC 4.0 07/25/2019   HGB 7.5 (L) 07/25/2019   HCT 21.6 (L) 07/25/2019   MCV 90.4 07/25/2019   PLT 163 07/25/2019    BMET    Component Value Date/Time   NA 123 (L) 07/25/2019 0441   NA 140 05/19/2013 0757   K 4.7 07/25/2019 0441   K 5.2 (H) 05/19/2013 0757   CL 93 (L) 07/25/2019 0441   CL 111 (H)  05/19/2013 0757   CO2 24 07/25/2019 0441   CO2 22 05/19/2013 0757   GLUCOSE 86 07/25/2019 0441   GLUCOSE 109 (H) 05/19/2013 0757   BUN 49 (H) 07/25/2019 0441   BUN 35 (H) 05/19/2013 0757   CREATININE 4.74 (H) 07/25/2019 0441   CREATININE 1.57 (H) 05/19/2013 0757   CALCIUM 7.1 (L) 07/25/2019 0441   CALCIUM 7.8 (L) 05/15/2019 0456   GFRNONAA 8 (L) 07/25/2019 0441   GFRNONAA 33 (L) 05/19/2013 0757   GFRAA 10 (L) 07/25/2019 0441   GFRAA 38 (L) 05/19/2013 0757   Estimated Creatinine Clearance: 9.2 mL/min (A) (by C-G formula based on SCr of 4.74 mg/dL (H)).  COAG No results found for: INR, PROTIME  Radiology CT ABDOMEN PELVIS WO CONTRAST  Result Date: 07/24/2019 CLINICAL DATA:  Anemia.  Multiple recent falls.  Nausea. EXAM: CT ABDOMEN AND PELVIS WITHOUT CONTRAST TECHNIQUE: Multidetector CT imaging of the abdomen and pelvis was performed following the standard protocol without IV contrast. COMPARISON:  07/22/2019 FINDINGS: Lower chest: Stable enlarged heart and moderate-sized pericardial effusion. Small bilateral pleural effusions, larger on the left and new on the right. 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 Hepatobiliary: No focal liver abnormality is seen. Status post cholecystectomy. No biliary dilatation. Pancreas: Unremarkable. No pancreatic ductal dilatation or surrounding inflammatory changes. Spleen: Normal in size without focal abnormality.  Adrenals/Urinary Tract: Adrenal glands are unremarkable. Kidneys are normal, without renal calculi, focal lesion, or hydronephrosis. Bladder is unremarkable. Stomach/Bowel: Stomach is within normal limits. Appendix appears normal. No evidence of bowel wall thickening, distention, or inflammatory changes. Vascular/Lymphatic: Atheromatous arterial calcifications without aneurysm. No enlarged lymph nodes. Reproductive: Uterus and bilateral adnexa are unremarkable. Other: Diffuse subcutaneous edema with improvement. Musculoskeletal: Lumbar spine  degenerative changes and mild scoliosis. IMPRESSION: 1. No acute abnormality. 2. Small bilateral pleural effusions, larger on the left and new on the right. 3. Stable cardiomegaly and moderate-sized pericardial effusion. 4. 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 Electronically Signed   By: Claudie Revering M.D.   On: 07/24/2019 16:06   CT ABDOMEN PELVIS WO CONTRAST  Result Date: 07/22/2019 CLINICAL DATA:  Abdominal pain, weakness, anorexia, fell yesterday EXAM: CT ABDOMEN AND PELVIS WITHOUT CONTRAST TECHNIQUE: Multidetector CT imaging of the abdomen and pelvis was performed following the standard protocol without IV contrast. COMPARISON:  09/22/2017 FINDINGS: Lower chest: There is chronic bilateral pleural thickening, with pleural calcification at the right costophrenic angle. Stable small pericardial effusion. Hepatobiliary: No focal liver abnormality is seen. Status post cholecystectomy. No biliary dilatation. Pancreas: Unremarkable. No pancreatic ductal dilatation or surrounding inflammatory changes. Spleen: Normal in size without focal abnormality. Adrenals/Urinary Tract: No urinary tract calculi or obstructive uropathy. There is bilateral renal cortical atrophy. Bladder is unremarkable. Stable adrenal glands. Stomach/Bowel: No bowel obstruction or ileus. Normal appendix right lower quadrant. No bowel wall thickening or inflammatory change. Vascular/Lymphatic: Aortic atherosclerosis. No enlarged abdominal or pelvic lymph nodes. Reproductive: Uterus and bilateral adnexa are unremarkable. Other: There is diffuse body wall edema greatest in the bilateral flanks. No free fluid or free gas within the peritoneal cavity. No abdominal wall hernia. Musculoskeletal: No acute or destructive bony lesions. Reconstructed images demonstrate no additional findings. IMPRESSION: 1. No acute intra-abdominal or intrapelvic process. 2. Diffuse body wall edema. 3. Aortic  Atherosclerosis (ICD10-I70.0). Electronically Signed   By: Randa Ngo M.D.   On: 07/22/2019 15:27      Assessment/Plan 1.  End-stage renal disease.  Needs a catheter to start dialysis at this time.  Plan to place a PermCath today.  Can see her as an outpatient for evaluation for extremity access. 2.  Anemia of chronic disease.  Expected blood loss from the procedure would be extremely low. 3.  Hypertension.  Likely an underlying cause of her renal failure and blood pressure control important in reducing the progression of atherosclerotic disease. On appropriate oral medications. 4.  Diabetes.  Likely an underlying cause of her renal failure and blood glucose control important in reducing the progression of atherosclerotic disease. Also, involved in wound healing. On appropriate medications.    Leotis Pain, MD  07/25/2019 10:24 AM    This note was created with Dragon medical transcription system.  Any error is purely unintentional

## 2019-07-25 NOTE — Progress Notes (Signed)
   07/25/19 1715  Vitals  BP (!) 170/76 (scheduled BP meds giving )

## 2019-07-25 NOTE — Consult Note (Addendum)
Consultation Note Date: 07/25/2019   Patient Name: Robin Rogers  DOB: [birth year]  MRN: [id number]  Age / Sex: 79 y.o., female  PCP: [facility] Referring Physician: Lorella Nimrod, MD  Reason for Consultation: Establishing goals of care and Psychosocial/spiritual support  HPI/Patient Profile: 79 y.o. female  with past medical history of CKD 4, HTN, diabetes diet-controlled, chronic combined heart failure EF of 40 to 45% Rogers 2021, blindness, anemia of chronic disease, admitted on 07/22/2019 with chronic hyponatremia, stage IV kidney disease, weakness.   Clinical Assessment and Goals of Care: I have reviewed medical records including EPIC notes, labs and imaging, received report from transition of care team, examined the patient and met at bedside with daughter Robin Rogers to discuss diagnosis prognosis, Robin Rogers, EOL wishes, disposition and options.  I introduced Palliative Medicine as specialized medical care for people living with serious illness. It focuses on providing relief from the symptoms and stress of a serious illness.   We discussed a brief life review of the patient.  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  She has been married to her husband for "many years".  She has several children, but daughter Robin Rogers is her main support system.  As far as functional and nutritional status, Mrs. Robin Rogers is blind, and Robin Rogers shares that she is losing her hearing.  She is also obese, and seems to have some balance and gait issues.  Robin Rogers states that they would like help getting her out of the home into the transportation vehicle that would take her to dialysis.  I share that transition of care team will help with these issues, but encouraged Robin Rogers to work with the social worker at the dialysis center.    We talked about having Mr. and Mrs. Robin Rogers go to dialysis  together.  Robin Rogers states that she feels they would do better separately that her mother is "a cry baby, like I am" and she believes that her mother Rogers cry out during dialysis.  She shares concerned that her father would be more concerned about his wife's "suffering" that he would be in managing his own dialysis treatment.  We discussed her current illness and what it means in the larger context of her on-going co-morbidities.  Natural disease trajectory and expectations at EOL were discussed.  We talked about expected declines and CODE STATUS.  Advanced directives, concepts specific to code status, artifical feeding and hydration, and rehospitalization were considered and discussed.  Robin Rogers states that their family has not discussed CODE STATUS previously.  We talked about "treat the treatable but allowing natural death".  Palliative Care services outpatient were explained and offered, but Robin Rogers states unless they can send someone who speaks Robin Rogers dialect of Spanish this would be of no benefit.  Questions and concerns were addressed.  The family was encouraged to call with questions or concerns.   Conference with attending, bedside nursing staff, and TOC of care team related to patient condition, needs, goals of care.  HCPOA    NEXT OF KIN -spouse and daughter Robin Rogers.  Robin Rogers states that she has several siblings, but she and Robin Rogers usually make decisions.   SUMMARY OF RECOMMENDATIONS   At this point full scope/full code Agreeable to outpatient dialysis Continue CODE STATUS discussions Declines outpatient palliative   Code Status/Advance Care Planning:  Full code -we talked about "treat the treatable, but allowing natural death".  And she states that her parents have never discussed their CODE STATUS wishes.  I share that out of respect for Mrs. Robin Rogers, it is important that we ask what matters to her.  Symptom Management:   Per hospitalist/nephrologist, no additional needs  at this time.  Palliative Prophylaxis:   Frequent Pain Assessment and Turn Reposition  Additional Recommendations (Limitations, Scope, Preferences):  Full Scope Treatment  Psycho-social/Spiritual:   Desire for further Chaplaincy support:no  Additional Recommendations: Caregiving  Support/Resources  Prognosis:   Unable to determine, the next 6 months or so are going to be telling for Mrs. Robin Rogers.  Historically, this is when people her new to dialysis have risk for complications.  If she is successful with dialysis, she could have several years of life.  Discharge Planning: To be determined, anticipate home with home health, declines outpatient palliative      Primary Diagnoses: Present on Admission: . Hyponatremia   I have reviewed the medical record, interviewed the patient and family, and examined the patient. The following aspects are pertinent.  Past Medical History:  Diagnosis Date  . Anemia of chronic disease   . Blindness   . Chronic combined systolic (congestive) and diastolic (congestive) heart failure ([place])    a. 09/2017 Echo: EF 45-50%, Gr2 DD; b. 05/2019 Echo: EF 40-45%, Gr III DD (restrictive), nl RV fxn, mod elev PASP. Mod dil LA. Mild MR.  . CKD (chronic kidney disease), stage IV ([place])   . Diet-controlled diabetes mellitus ([place])   . Hypertension    Social History   Socioeconomic History  . Marital status: Married    Spouse name: Not on file  . Number of children: Not on file  . Years of education: Not on file  . Highest education level: Not on file  Occupational History  . Not on file  Tobacco Use  . Smoking status: Never Smoker  . Smokeless tobacco: Never Used  Vaping Use  . Vaping Use: Never used  Substance and Sexual Activity  . Alcohol use: No  . Drug use: Never  . Sexual activity: Not Currently  Other Topics Concern  . Not on file  Social History Narrative  . Not on file   Social Determinants of Health   Financial Resource Strain:     . Difficulty of Paying Living Expenses:   Food Insecurity:   . Worried About Charity fundraiser in the Last Year:   . Arboriculturist in the Last Year:   Transportation Needs:   . Film/video editor (Medical):   Marland Kitchen Lack of Transportation (Non-Medical):   Physical Activity:   . Days of Exercise per Week:   . Minutes of Exercise per Session:   Stress:   . Feeling of Stress :   Social Connections:   . Frequency of Communication with Friends and Family:   . Frequency of Social Gatherings with Friends and Family:   . Attends Religious Services:   . Active Member of Clubs or Organizations:   . Attends Archivist Meetings:   Marland Kitchen Marital Status:  History reviewed. No pertinent family history. Scheduled Meds: . sodium chloride   Intravenous Once  . aspirin EC  81 mg Oral Daily  . carvedilol  6.25 mg Oral BID WC  . docusate sodium  100 mg Oral BID  . feeding supplement (ENSURE ENLIVE)  237 mL Oral BID BM  . ferrous sulfate  325 mg Oral BID WC  . furosemide  40 mg Intravenous BID  . heparin  5,000 Units Subcutaneous Q8H  . hydrALAZINE  10 mg Oral TID  . insulin aspart  0-9 Units Subcutaneous TID WC  . multivitamin  1 tablet Oral QHS  . pantoprazole  40 mg Oral Daily  . pravastatin  40 mg Oral q1800  . sodium chloride flush  3 mL Intravenous Once  . sodium chloride flush  3 mL Intravenous Q12H  . traZODone  50 mg Oral QHS   Continuous Infusions: PRN Meds:.acetaminophen **OR** acetaminophen, bisacodyl, morphine injection, ondansetron **OR** ondansetron (ZOFRAN) IV, traMADol, traZODone Medications Prior to Admission:  Prior to Admission medications   Medication Sig Start Date End Date Taking? Authorizing Provider  sevelamer carbonate (RENVELA) 800 MG tablet Take 800 mg by mouth 3 (three) times daily with meals.   Yes [provider]  sodium bicarbonate 650 MG tablet Take 650 mg by mouth 2 (two) times daily.   Yes [provider]  aspirin EC 81 MG EC  tablet Take 1 tablet (81 mg total) by mouth daily. 05/16/19   Swayze, Ava, DO  carvedilol (COREG) 6.25 MG tablet Take 1 tablet (6.25 mg total) by mouth 2 (two) times daily with a meal. 05/16/19   Swayze, Ava, DO  ferrous sulfate 325 (65 FE) MG tablet Take 325 mg by mouth 2 (two) times daily with a meal.    [provider]  furosemide (LASIX) 40 MG tablet Hold until followup with PCP. Patient not taking: Reported on 07/22/2019 06/03/19   Enzo Bi, MD  hydrALAZINE (APRESOLINE) 10 MG tablet Take 1 tablet (10 mg total) by mouth 3 (three) times daily. 05/16/19   Swayze, Ava, DO  lovastatin (MEVACOR) 40 MG tablet Take 1 tablet by mouth at bedtime.  01/21/17   [provider]  melatonin 1 MG TABS tablet Take 2 mg by mouth at bedtime.    [provider]  omeprazole (PRILOSEC) 40 MG capsule Take 40 mg by mouth daily.    [provider]  ondansetron (ZOFRAN-ODT) 4 MG disintegrating tablet Take 4 mg by mouth every 8 (eight) hours as needed. 05/25/19   [provider]  traZODone (DESYREL) 50 MG tablet Take 50 mg by mouth at bedtime. Patient not taking: Reported on 07/22/2019 05/25/19   [provider]  Vitamin D, Ergocalciferol, (DRISDOL) 1.25 MG (50000 UNIT) CAPS capsule Take 50,000 Units by mouth every 7 (seven) days. [facility])    [provider]   No Known Allergies Review of Systems  Unable to perform ROS: Other    Physical Exam Vitals and nursing note reviewed.  Constitutional:      General: She is not in acute distress.    Appearance: She is obese. She is ill-appearing.  HENT:     Mouth/Throat:     Mouth: Mucous membranes are moist.  Cardiovascular:     Rate and Rhythm: Normal rate.  Pulmonary:     Effort: Pulmonary effort is normal. No respiratory distress.  Abdominal:     Comments: Soft rounded abdomen  Skin:    General: Skin is warm and dry.  Neurological:     Comments: Asleep after permacath placed     Vital Signs: BP (!)  164/61 (BP Location: Right Arm)   Pulse 66   Temp 98.4 F (36.9 C) (Oral)   Resp 18   Ht _0  (1.575 m)   Wt 71.8 kg   SpO2 100%   BMI 28.95 kg/m  Pain Scale: 0-10   Pain Score: 0-No pain   SpO2: SpO2: 100 % O2 Device:SpO2: 100 % O2 Flow Rate: .   IO: Intake/output summary: No intake or output data in the 24 hours ending 07/25/19 0934  LBM: Last BM Date: 07/24/19 Baseline Weight: Weight: 68 kg Most recent weight: Weight: 71.8 kg     Palliative Assessment/Data:   Flowsheet Rows     Most Recent Value  Intake Tab  Referral Department Hospitalist  Unit at Time of Referral Med/Surg Unit  Palliative Care Primary Diagnosis Nephrology  Date Notified 07/23/19  Palliative Care Type New Palliative care  Reason for referral Clarify Goals of Care  Date of Admission 07/22/19  Date first seen by Palliative Care 07/25/19  # of days Palliative referral response time 2 Day(s)  # of days IP prior to Palliative referral 1  Clinical Assessment  Palliative Performance Scale Score 40%  Pain Max last 24 hours Not able to report  Pain Min Last 24 hours Not able to report  Dyspnea Max Last 24 Hours Not able to report  Dyspnea Min Last 24 hours Not able to report  Psychosocial & Spiritual Assessment  Palliative Care Outcomes      Time In: 1240 Time Out: 1350 Time Total: 70 minutes Greater than 50%  of this time was spent counseling and coordinating care related to the above assessment and plan.  Signed by: Drue Novel, NP   Please contact Palliative Medicine Team phone at [phone] for questions and concerns.  For individual provider: See Shea Evans

## 2019-07-26 ENCOUNTER — Encounter: Payer: Self-pay | Admitting: Vascular Surgery

## 2019-07-26 ENCOUNTER — Inpatient Hospital Stay: Payer: Medicare Other

## 2019-07-26 LAB — RENAL FUNCTION PANEL
Albumin: 2.2 g/dL — ABNORMAL LOW (ref 3.5–5.0)
Anion gap: 5 (ref 5–15)
BUN: 32 mg/dL — ABNORMAL HIGH (ref 8–23)
CO2: 29 mmol/L (ref 22–32)
Calcium: 7.2 mg/dL — ABNORMAL LOW (ref 8.9–10.3)
Chloride: 97 mmol/L — ABNORMAL LOW (ref 98–111)
Creatinine, Ser: 3.62 mg/dL — ABNORMAL HIGH (ref 0.44–1.00)
GFR calc Af Amer: 13 mL/min — ABNORMAL LOW (ref >60)
GFR calc non Af Amer: 11 mL/min — ABNORMAL LOW (ref >60)
Glucose, Bld: 84 mg/dL (ref 70–99)
Phosphorus: 3.8 mg/dL (ref 2.5–4.6)
Potassium: 4.4 mmol/L (ref 3.5–5.1)
Sodium: 131 mmol/L — ABNORMAL LOW (ref 135–145)

## 2019-07-26 LAB — CBC
HCT: 21.1 % — ABNORMAL LOW (ref 36.0–46.0)
Hemoglobin: 6.8 g/dL — ABNORMAL LOW (ref 12.0–15.0)
MCH: 31.1 pg (ref 26.0–34.0)
MCHC: 32.2 g/dL (ref 30.0–36.0)
MCV: 96.3 fL (ref 80.0–100.0)
Platelets: 157 10*3/uL (ref 150–400)
RBC: 2.19 MIL/uL — ABNORMAL LOW (ref 3.87–5.11)
RDW: 13.5 % (ref 11.5–15.5)
WBC: 4.5 10*3/uL (ref 4.0–10.5)
nRBC: 0 % (ref 0.0–0.2)

## 2019-07-26 LAB — GLUCOSE, CAPILLARY
Glucose-Capillary: 82 mg/dL (ref 70–99)
Glucose-Capillary: 105 mg/dL — ABNORMAL HIGH (ref 70–99)
Glucose-Capillary: 110 mg/dL — ABNORMAL HIGH (ref 70–99)
Glucose-Capillary: 125 mg/dL — ABNORMAL HIGH (ref 70–99)

## 2019-07-26 LAB — PREPARE RBC (CROSSMATCH)

## 2019-07-26 MED ORDER — FUROSEMIDE 40 MG PO TABS
40.0000 mg | ORAL_TABLET | Freq: Every day | ORAL | Status: DC
  Administered 2019-07-26 – 2019-07-27 (×2): 40 mg via ORAL
  Filled 2019-07-26 (×2): qty 1

## 2019-07-26 MED ORDER — SODIUM CHLORIDE 0.9% IV SOLUTION: Freq: Once | INTRAVENOUS | Status: AC

## 2019-07-26 MED ORDER — CARVEDILOL 3.125 MG PO TABS
6.2500 mg | ORAL_TABLET | Freq: Two times a day (BID) | ORAL | Status: DC
  Administered 2019-07-27: 6.25 mg via ORAL
  Filled 2019-07-26: qty 2

## 2019-07-26 MED ORDER — FERROUS SULFATE 325 (65 FE) MG PO TABS
325.0000 mg | ORAL_TABLET | Freq: Two times a day (BID) | ORAL | Status: DC
  Administered 2019-07-27: 15:00:00 325 mg via ORAL

## 2019-07-26 NOTE — Progress Notes (Addendum)
PROGRESS NOTE    Robin Rogers  BWI:[id number] DOB: 04/12/1940 DOA: 07/22/2019 PCP: [facility] [place]   Brief Narrative:  79 y/o F w/ PMH of CKDIV, HTN, ACD, HLD, CHF, GERD who presents w/ weakness & no appetite x 1 week. Pt has had multiple falls at home w/ most recently being day prior to admission. Pt denies hitting her head or LOC. Pt c/o feeling full despite not having an appetite. Pt also c/o nausea but no vomiting.  Patient is a [name] and needs interpreter. Patient was seen by [facility] on 5/24 where dialysis was discussed. Patient states she would do dialysis only if it was needed. Patient was then admitted to [facility] from 5/24 to 5/28. She deferred starting dialysis on that admission.  On arrival she had sodium of 120 and admitted for hyponatremia.  Subjective: Patient has no new complaint today.  Resting comfortably in bed.  Getting dialyzed at bedside.  I tried to communicate with her wire interpreter but interpreter was not able to help much as she does not speak clear Spanish and uses dialect/[place]  Assessment & Plan:   Active Problems:   Hyponatremia   CKD (chronic kidney disease), stage V (HCC)   Acute on chronic HFrEF (heart failure with reduced ejection fraction) (HCC)   Chronic anemia  Hyponatremia.  Seems chronic most likely secondary annual disease as hyponatremia labs are consistent with that. On exam she appears volume overload with history of HFrEF and worsening renal status. -Continue Lasix 40 mg oral daily -Nephrology following for dialysis -Check TSH-within normal limit. -Continue to monitor  New ESRD  -Received second hemodialysis treatment today per nephrology -Palliative care consult. Left IJ permacath placed on 7/19 by Dr. Lucky Cowboy  HFrEF.  Prior echo with EF of 40 to 45% in May 2021.  BNP  more than 4000.  Was given some IV fluid overnight on admission.  No proper intake and output recorded in chart.  Weight  increased to 158 pound, it was 150 on admission.  Remained stable today. -Continue oral Lasix 40 mg daily -Daily BMP. -Strict intake and output-asked nursing staff again. -Daily weight. -Continue home dose of carvedilol and statin.  Generalized weakness.  Most likely secondary to worsening renal function, hyponatremia and HFrEF. -PT/OT recommends no skilled therapy need  Anemia of chronic kidney disease.  Status post 1 packed red blood cell transfusion.  Hemoglobin dropped to 6.8 again today.  No obvious signs of bleeding.  I have ordered 1 more unit of packed red blood cell transfusion for today. -CT abdomen without contrast to rule out any conspicuous bleeding was negative for any retroperitoneal bleed. -Check FOBT-ordered and still pending. -Might need Aranesp-ask nephrology to start her on it as appropriate. -Continue to monitor -Transfuse below 7.  Consider GI consultation if she continues to drop hemoglobin  Hypertension.  Blood pressure mildly elevated. -Continue home dose of carvedilol. -Continue home dose of hydralazine. -Continue Lasix  Hyperlipidemia. -Continue statin  GERD. -Continue PPI  Objective: Vitals:   07/26/19 1341 07/26/19 1533 07/26/19 1632 07/26/19 1702  BP: (!) 171/71 (!) 140/43 (!) 141/50 (!) 120/45  Pulse: 86 74 71 76  Resp: 16 18  15   Temp: 98.6 F (37 C) 98 F (36.7 C) 98.8 F (37.1 C) 98.4 F (36.9 C)  TempSrc: Oral Oral Oral Oral  SpO2: 100% 99% 99% 100%  Weight:      Height:        Intake/Output Summary (Last 24  hours) at 07/26/2019 1740 Last data filed at 07/26/2019 1300 Gross per 24 hour  Intake --  Output 500 ml  Net -500 ml   Filed Weights   07/22/19 1205 07/24/19 0500 07/25/19 0408  Weight: 68 kg 71.9 kg 71.8 kg    Examination:  General.  Well-developed, blind lady,   in no acute distress. Pulmonary.  Lungs clear bilaterally, normal respiratory effort. CV.  Regular rate and rhythm, no JVD, rub or murmur. Abdomen.  Soft,  nontender, nondistended, BS positive. CNS.  Alert and oriented x3.  No focal neurologic deficit. Extremities.  1+ LE edema, no cyanosis, pulses intact and symmetrical. Psychiatry.  Judgment and insight appears normal. Access: Left IJ permacath placed by Dr. Lucky Cowboy on 7/19  DVT prophylaxis: Heparin Code Status: Full Family Communication: Updated Robin Rogers on 7/20 who communicates in [place]. Disposition Plan:  Status is: Inpatient  Remains inpatient appropriate because:Inpatient level of care appropriate due to severity of illness   Dispo: The patient is from: Home              Anticipated d/c is to: Home              Anticipated d/c date is: 2-3 days              Patient currently is not medically stable to d/c.  Patient with new diagnosis of ESRD.  Will need several dialysis session before she could get outpatient slot.  Patient is high risk for deterioration due to multiple comorbidities.  Palliative care has been consulted.  Consultants:   Nephrology  Procedures:  Antimicrobials:   Data Reviewed: I have personally reviewed following labs and imaging studies  CBC: Recent Labs  Lab 07/22/19 1153 07/22/19 1153 07/23/19 0530 07/23/19 0530 07/23/19 2043 07/24/19 0333 07/24/19 1211 07/25/19 0441 07/26/19 0506  WBC 4.8  --  4.6  --   --  3.8*  --  4.0 4.5  HGB 7.5*   < > 6.9*   < > 8.4* 7.1* 8.0* 7.5* 6.8*  HCT 22.8*   < > 20.1*   < > 24.6* 21.3* 23.0* 21.6* 21.1*  MCV 95.4  --  92.2  --   --  94.2  --  90.4 96.3  PLT 205  --  182  --   --  160  --  163 157   < > = values in this interval not displayed.   Basic Metabolic Panel: Recent Labs  Lab 07/22/19 1153 07/22/19 1642 07/23/19 0530 07/23/19 0902 07/24/19 0333 07/25/19 0441 07/26/19 0506  NA 120*   < > 124*  125* 124* 124* 123* 131*  K 4.5  --  4.4  --  4.4 4.7 4.4  CL 89*  --  93*  --  95* 93* 97*  CO2 23  --  23  --  23 24 29   GLUCOSE 186*  --  85  --  75 86 84  BUN 50*  --  47*  --  48* 49* 32*    CREATININE 4.66*  --  4.46*  --  4.45* 4.74* 3.62*  CALCIUM 7.6*  --  7.6*  --  7.1* 7.1* 7.2*  PHOS  --   --   --   --   --   --  3.8   < > = values in this interval not displayed.   GFR: Estimated Creatinine Clearance: 12.1 mL/min (A) (by C-G formula based on SCr of 3.62 mg/dL (H)). Liver Function Tests: Recent  Labs  Lab 07/26/19 0506  ALBUMIN 2.2*   No results for input(s): LIPASE, AMYLASE in the last 168 hours. No results for input(s): AMMONIA in the last 168 hours. Coagulation Profile: No results for input(s): INR, PROTIME in the last 168 hours. Cardiac Enzymes: No results for input(s): CKTOTAL, CKMB, CKMBINDEX, TROPONINI in the last 168 hours. BNP (last 3 results) No results for input(s): PROBNP in the last 8760 hours. HbA1C: No results for input(s): HGBA1C in the last 72 hours. CBG: Recent Labs  Lab 07/25/19 1717 07/25/19 2057 07/26/19 0751 07/26/19 1334 07/26/19 1554  GLUCAP 109* 164* 82 105* 110*   Lipid Profile: No results for input(s): CHOL, HDL, LDLCALC, TRIG, CHOLHDL, LDLDIRECT in the last 72 hours. Thyroid Function Tests: No results for input(s): TSH, T4TOTAL, FREET4, T3FREE, THYROIDAB in the last 72 hours. Anemia Panel: No results for input(s): VITAMINB12, FOLATE, FERRITIN, TIBC, IRON, RETICCTPCT in the last 72 hours. Sepsis Labs: No results for input(s): PROCALCITON, LATICACIDVEN in the last 168 hours.  Recent Results (from the past 240 hour(s))  SARS Coronavirus 2 by RT PCR (hospital order, performed in [facility] hospital lab) Nasopharyngeal     Status: None   Collection Time: 07/22/19  2:57 PM   Specimen: Nasopharyngeal  Result Value Ref Range Status   SARS Coronavirus 2 NEGATIVE NEGATIVE Final    Comment: (NOTE) SARS-CoV-2 target nucleic acids are NOT DETECTED.  The SARS-CoV-2 RNA is generally detectable in upper and lower respiratory specimens during the acute phase of infection. The lowest concentration of SARS-CoV-2 viral copies this  assay can detect is 250 copies / mL. A negative result does not preclude SARS-CoV-2 infection and should not be used as the sole basis for treatment or other patient management decisions.  A negative result may occur with improper specimen collection / handling, submission of specimen other than nasopharyngeal swab, presence of viral mutation(s) within the areas targeted by this assay, and inadequate number of viral copies (<250 copies / mL). A negative result must be combined with clinical observations, patient history, and epidemiological information.  Fact Sheet for Patients:   StrictlyIdeas.no  Fact Sheet for Healthcare Providers: BankingDealers.co.za  This test is not yet approved or  cleared by the [place] FDA and has been authorized for detection and/or diagnosis of SARS-CoV-2 by FDA under an Emergency Use Authorization (EUA).  This EUA will remain in effect (meaning this test can be used) for the duration of the COVID-19 declaration under Section 564(b)(1) of the Act, 21 U.S.C. section 360bbb-3(b)(1), unless the authorization is terminated or revoked sooner.  Performed at [facility], [address] [place], [place] [address]      Radiology Studies: DG Chest 2 View  Result Date: 07/26/2019 CLINICAL DATA:  Weakness. EXAM: CHEST - 2 VIEW COMPARISON:  May 10, 2019. FINDINGS: Stable cardiomegaly. Left internal jugular Port-A-Cath is noted with distal tips in the SVC. No pneumothorax or pleural effusion is noted. Small pleural effusions may be present. No definite consolidative process is noted. Mild central pulmonary vascular congestion is noted. Bony thorax is unremarkable. IMPRESSION: Stable cardiomegaly with mild central pulmonary vascular congestion. Small pleural effusions may be present. Aortic Atherosclerosis (ICD10-I70.0). Electronically Signed   By: Marijo Conception M.D.   On: 07/26/2019 16:24   PERIPHERAL  VASCULAR CATHETERIZATION  Result Date: 07/25/2019 See op note   Scheduled Meds: . sodium chloride   Intravenous Once  . aspirin EC  81 mg Oral Daily  . [START ON 07/27/2019] carvedilol  6.25 mg Oral BID WC  . Chlorhexidine Gluconate Cloth  6 each Topical Q0600  . docusate sodium  100 mg Oral BID  . feeding supplement (ENSURE ENLIVE)  237 mL Oral BID BM  . [START ON 07/27/2019] ferrous sulfate  325 mg Oral BID WC  . furosemide  40 mg Oral Daily  . heparin  5,000 Units Subcutaneous Q8H  . hydrALAZINE  10 mg Oral TID  . insulin aspart  0-9 Units Subcutaneous TID WC  . multivitamin  1 tablet Oral QHS  . pantoprazole  40 mg Oral Daily  . pravastatin  40 mg Oral q1800  . sodium chloride flush  3 mL Intravenous Once  . sodium chloride flush  3 mL Intravenous Q12H  . traZODone  50 mg Oral QHS   Continuous Infusions:    LOS: 4 days   Time spent: 35 minutes.  More than 50% of time was spent with direct patient care and planning.  Max Sane, MD [facility]  If 7PM-7AM, please contact night-coverage [email or web address]  07/26/2019, 5:40 PM   This record has been created using Systems analyst. Errors have been sought and corrected,but may not always be located. Such creation errors do not reflect on the standard of care.

## 2019-07-26 NOTE — Evaluation (Signed)
Physical Therapy Evaluation Patient Details Name: Robin Rogers MRN: [id number] DOB: 1940-12-08 Today's Date: 07/26/2019   History of Present Illness  Robin Rogers  is a 79 y.o. female with a medical history of blindness, type 2 diabetes mellitus CHF, hypertension, stage IV chronic kidney disease and anemia, presented to the ED on 5/24 with complaints of of generalized weakness, poor appetite, insomnia and occasional nausea vomiting for about a week.  In the ED, hypertensive 175/108, otherwise normal vitals.  Discussion of need for dialysis at that time, now here post port placement and initiation of HD.  Clinical Impression  Difficult eval secondary to blindness, difficulty with translation due to (apparently) her dialect but generally able to follow simple instructions at least to the point of determining she is not far from baseline and feels good bout how she moved and the assistance she will have from husband and children when she goes home.  Pt more questioning about dialysis and fortunately at end of session nephrologist came for further field those questions.  Overall good effort and does not feel that she needs further follow up or DME once cleared for discharge.    Follow Up Recommendations No PT follow up (will maintain on caseload while admitted)    Equipment Recommendations  None recommended by PT (pt states she feels good with SPC at home)    Recommendations for Other Services       Precautions / Restrictions Precautions Precautions: Fall Restrictions Weight Bearing Restrictions: No      Mobility  Bed Mobility Overal bed mobility: Modified Independent             General bed mobility comments: no assist needed apart from direct cues to initiate movement as even with interp communication was challenging  Transfers Overall transfer level: Modified independent Equipment used: Rolling walker (2 wheeled)             General transfer comment: Pt was able to  rise to standing w/o direct assist, no LOBs or unsteadiness  Ambulation/Gait Ambulation/Gait assistance: Min guard Gait Distance (Feet): 45 Feet Assistive device: Rolling walker (2 wheeled)       General Gait Details: Pt with very slow, hesitant gait; likely related to blindness more than actual physcial limitations.  She had no LOBs or overt safety issues, minimal fatigue, but we struggled to get her to walk more secondary to inconsistent understanding of cues, etc during the session  Stairs            Wheelchair Mobility    Modified Rankin (Stroke Patients Only)       Balance Overall balance assessment: Modified Independent                                           Pertinent Vitals/Pain Pain Assessment: No/denies pain    Home Living Family/patient expects to be discharged to:: Private residence Living Arrangements: Spouse/significant other Available Help at Discharge: Family;Available 24 hours/day Type of Home: House Home Access: Ramped entrance     Home Layout: One level Home Equipment: Cane - single point Additional Comments: typically someone with her secondary to blindness    Prior Function Level of Independence: Independent with assistive device(s)               Hand Dominance        Extremity/Trunk Assessment   Upper Extremity Assessment Upper Extremity Assessment:  Generalized weakness;Overall [facility] for tasks assessed    Lower Extremity Assessment Lower Extremity Assessment: Generalized weakness;Overall [facility] for tasks assessed       Communication   Communication: Interpreter utilized (tele interp Verdis Frederickson #[id number]), dialect complicates comms)  Cognition Arousal/Alertness: Awake/alert Behavior During Therapy: WFL for tasks assessed/performed Overall Cognitive Status: Difficult to assess                                 General Comments: pt needed repeated explanations t/o the session, seems more related to  dialect/communication difficulties more than cognition      General Comments      Exercises     Assessment/Plan    PT Assessment Patient needs continued PT services (will maintain while in hosptial to insure mobility as able)  PT Problem List Decreased strength;Decreased activity tolerance;Decreased cognition;Decreased balance;Decreased safety awareness;Decreased knowledge of use of DME;Decreased mobility       PT Treatment Interventions DME instruction;Gait training;Functional mobility training;Therapeutic activities;Therapeutic exercise;Balance training;Neuromuscular re-education;Cognitive remediation    PT Goals (Current goals can be found in the Care Plan section)  Acute Rehab PT Goals Patient Stated Goal: pt with alternating questions with dialysis vs when she can go PT Goal Formulation: With patient Time For Goal Achievement: 08/09/19 Potential to Achieve Goals: Good    Frequency Min 2X/week   Barriers to discharge        Co-evaluation               AM-PAC PT "6 Clicks" Mobility  Outcome Measure Help needed turning from your back to your side while in a flat bed without using bedrails?: None Help needed moving from lying on your back to sitting on the side of a flat bed without using bedrails?: None Help needed moving to and from a bed to a chair (including a wheelchair)?: None Help needed standing up from a chair using your arms (e.g., wheelchair or bedside chair)?: None Help needed to walk in hospital room?: A Little Help needed climbing 3-5 steps with a railing? : A Little 6 Click Score: 22    End of Session Equipment Utilized During Treatment: Gait belt Activity Tolerance: Patient tolerated treatment well Patient left: with call bell/phone within reach;with nursing/sitter in room;in bed Nurse Communication: Mobility status PT Visit Diagnosis: Muscle weakness (generalized) (M62.81);Difficulty in walking, not elsewhere classified (R26.2)    Time:  8099-8338 PT Time Calculation (min) (ACUTE ONLY): 29 min   Charges:   PT Evaluation $PT Eval Low Complexity: 1 Low PT Treatments $Gait Training: 8-22 mins        Kreg Shropshire, DPT 07/26/2019, 10:53 AM

## 2019-07-26 NOTE — Progress Notes (Signed)
[facility]  ROUNDING NOTE   Subjective:   First hemodialysis treatment yesterday. Tolerated treatment well. Second treatment scheduled for today.  Complains of pain over tunneled catheter site.   History taken with assistance of Patent examiner. Patient able to understand short simple phrases in Spanish  Objective:  Vital signs in last 24 hours:  Temp:  [98 F (36.7 C)-98.7 F (37.1 C)] 98.3 F (36.8 C) (07/20 0753) Pulse Rate:  [60-78] 68 (07/20 0753) Resp:  [8-20] 15 (07/20 0753) BP: (124-182)/(45-115) 141/57 (07/20 0753) SpO2:  [96 %-100 %] 96 % (07/20 0753)  Weight change:  Filed Weights   07/22/19 1205 07/24/19 0500 07/25/19 0408  Weight: 68 kg 71.9 kg 71.8 kg    Intake/Output: I/O last 3 completed shifts: In: 109.7 [I.V.:109.7] Out: -    Intake/Output this shift:  No intake/output data recorded.  Physical Exam: General: NAD, sitting up in bed  Head: Normocephalic, atraumatic. Moist oral mucosal membranes  Eyes: +blind  Neck: Supple, trachea midline  Lungs:  Basilar crackles  Heart: Regular rate and rhythm  Abdomen:  Soft, nontender,   Extremities:  trace peripheral edema.  Neurologic: Nonfocal, moving all four extremities  Skin: No lesions  Access: [facility]J permcath 7/19 Dr. Lucky Cowboy    Basic Metabolic Panel: Recent Labs  Lab 07/22/19 1153 07/22/19 1642 07/23/19 0530 07/23/19 0530 07/23/19 0902 07/24/19 0333 07/25/19 0441 07/26/19 0506  NA 120*   < > 124*  125*  --  124* 124* 123* 131*  K 4.5  --  4.4  --   --  4.4 4.7 4.4  CL 89*  --  93*  --   --  95* 93* 97*  CO2 23  --  23  --   --  23 24 29   GLUCOSE 186*  --  85  --   --  75 86 84  BUN 50*  --  47*  --   --  48* 49* 32*  CREATININE 4.66*  --  4.46*  --   --  4.45* 4.74* 3.62*  CALCIUM 7.6*  --  7.6*   < >  --  7.1* 7.1* 7.2*  PHOS  --   --   --   --   --   --   --  3.8   < > = values in this interval not displayed.    Liver Function Tests: Recent Labs  Lab  07/26/19 0506  ALBUMIN 2.2*   No results for input(s): LIPASE, AMYLASE in the last 168 hours. No results for input(s): AMMONIA in the last 168 hours.  CBC: Recent Labs  Lab 07/22/19 1153 07/22/19 1153 07/23/19 0530 07/23/19 0530 07/23/19 2043 07/24/19 0333 07/24/19 1211 07/25/19 0441 07/26/19 0506  WBC 4.8  --  4.6  --   --  3.8*  --  4.0 4.5  HGB 7.5*   < > 6.9*   < > 8.4* 7.1* 8.0* 7.5* 6.8*  HCT 22.8*   < > 20.1*   < > 24.6* 21.3* 23.0* 21.6* 21.1*  MCV 95.4  --  92.2  --   --  94.2  --  90.4 96.3  PLT 205  --  182  --   --  160  --  163 157   < > = values in this interval not displayed.    Cardiac Enzymes: No results for input(s): CKTOTAL, CKMB, CKMBINDEX, TROPONINI in the last 168 hours.  BNP: Invalid input(s): POCBNP  CBG: Recent Labs  Lab  07/25/19 1029 07/25/19 1252 07/25/19 1717 07/25/19 2057 07/26/19 0751  GLUCAP 95 109* 109* 164* 82    Microbiology: Results for orders placed or performed during the hospital encounter of 07/22/19  SARS Coronavirus 2 by RT PCR (hospital order, performed in [facility] hospital lab) Nasopharyngeal     Status: None   Collection Time: 07/22/19  2:57 PM   Specimen: Nasopharyngeal  Result Value Ref Range Status   SARS Coronavirus 2 NEGATIVE NEGATIVE Final    Comment: (NOTE) SARS-CoV-2 target nucleic acids are NOT DETECTED.  The SARS-CoV-2 RNA is generally detectable in upper and lower respiratory specimens during the acute phase of infection. The lowest concentration of SARS-CoV-2 viral copies this assay can detect is 250 copies / mL. A negative result does not preclude SARS-CoV-2 infection and should not be used as the sole basis for treatment or other patient management decisions.  A negative result may occur with improper specimen collection / handling, submission of specimen other than nasopharyngeal swab, presence of viral mutation(s) within the areas targeted by this assay, and inadequate number of viral  copies (<250 copies / mL). A negative result must be combined with clinical observations, patient history, and epidemiological information.  Fact Sheet for Patients:   StrictlyIdeas.no  Fact Sheet for Healthcare Providers: BankingDealers.co.za  This test is not yet approved or  cleared by the [place] FDA and has been authorized for detection and/or diagnosis of SARS-CoV-2 by FDA under an Emergency Use Authorization (EUA).  This EUA will remain in effect (meaning this test can be used) for the duration of the COVID-19 declaration under Section 564(b)(1) of the Act, 21 U.S.C. section 360bbb-3(b)(1), unless the authorization is terminated or revoked sooner.  Performed at [facility], [address] [place], [place] [address]     Coagulation Studies: No results for input(s): LABPROT, INR in the last 72 hours.  Urinalysis: No results for input(s): COLORURINE, LABSPEC, PHURINE, GLUCOSEU, HGBUR, BILIRUBINUR, KETONESUR, PROTEINUR, UROBILINOGEN, NITRITE, LEUKOCYTESUR in the last 72 hours.  Invalid input(s): APPERANCEUR    Imaging: CT ABDOMEN PELVIS WO CONTRAST  Result Date: 07/24/2019 CLINICAL DATA:  Anemia.  Multiple recent falls.  Nausea. EXAM: CT ABDOMEN AND PELVIS WITHOUT CONTRAST TECHNIQUE: Multidetector CT imaging of the abdomen and pelvis was performed following the standard protocol without IV contrast. COMPARISON:  07/22/2019 FINDINGS: Lower chest: Stable enlarged heart and moderate-sized pericardial effusion. Small bilateral pleural effusions, larger on the left and new on the right. 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 Hepatobiliary: No focal liver abnormality is seen. Status post cholecystectomy. No biliary dilatation. Pancreas: Unremarkable. No pancreatic ductal dilatation or surrounding inflammatory changes. Spleen: Normal in size without focal abnormality. Adrenals/Urinary Tract: Adrenal glands are  unremarkable. Kidneys are normal, without renal calculi, focal lesion, or hydronephrosis. Bladder is unremarkable. Stomach/Bowel: Stomach is within normal limits. Appendix appears normal. No evidence of bowel wall thickening, distention, or inflammatory changes. Vascular/Lymphatic: Atheromatous arterial calcifications without aneurysm. No enlarged lymph nodes. Reproductive: Uterus and bilateral adnexa are unremarkable. Other: Diffuse subcutaneous edema with improvement. Musculoskeletal: Lumbar spine degenerative changes and mild scoliosis. IMPRESSION: 1. No acute abnormality. 2. Small bilateral pleural effusions, larger on the left and new on the right. 3. Stable cardiomegaly and moderate-sized pericardial effusion. 4. 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 Electronically Signed   By: Claudie Revering M.D.   On: 07/24/2019 16:06   PERIPHERAL VASCULAR CATHETERIZATION  Result Date: 07/25/2019 See op note    Medications:    .  sodium chloride   Intravenous Once  . aspirin EC  81 mg Oral Daily  . carvedilol  6.25 mg Oral BID WC  . Chlorhexidine Gluconate Cloth  6 each Topical Q0600  . docusate sodium  100 mg Oral BID  . feeding supplement (ENSURE ENLIVE)  237 mL Oral BID BM  . ferrous sulfate  325 mg Oral BID WC  . furosemide  40 mg Intravenous BID  . heparin  5,000 Units Subcutaneous Q8H  . hydrALAZINE  10 mg Oral TID  . insulin aspart  0-9 Units Subcutaneous TID WC  . multivitamin  1 tablet Oral QHS  . pantoprazole  40 mg Oral Daily  . pravastatin  40 mg Oral q1800  . sodium chloride flush  3 mL Intravenous Once  . sodium chloride flush  3 mL Intravenous Q12H  . traZODone  50 mg Oral QHS   acetaminophen **OR** acetaminophen, bisacodyl, morphine injection, ondansetron **OR** ondansetron (ZOFRAN) IV, traMADol, traZODone  Assessment/ Plan:  Ms. BRYLI MANTEY is a 79 y.o. hispanic female with congestive heart failure, hypertension, diabetes  mellitus type 2, blindness, who is admitted to [facility] on 07/22/2019 for Hyponatremia [E87.1] Chronic anemia [D64.9] Generalized weakness [R53.1]  1. End stage renal disease. Second hemodialysis treatment for today.   2. Anemia of chronic kidney disease : status post PRBC transfusion on 7/17 hemogloibn 6.8 - transfusion for today  3. Hyponatremia: secondary to congestive heart failure and renal failure. Volume overloaded Improved on dialysis.   4. Secondary Hyperparathyroidism: PTH 200 on 5/9. With hypocalcemia. Not currently on binders or vitamin D agents.   5. Hypertension:  - carvedilol, hydralazine  - Change to PO furosemide    LOS: 4 Remmie Bembenek 7/20/202110:32 AM

## 2019-07-26 NOTE — Progress Notes (Signed)
Interpreter [phone] used for translation. Educated provided on blood transfusion. Pt denies pain and needing to use bathroom at food at this time Pt is feeling nauseous. Zofran giving

## 2019-07-26 NOTE — Progress Notes (Signed)
Held lasix this morning per HD

## 2019-07-26 NOTE — Progress Notes (Signed)
Interpreter [phone] used for translation. Pt denies pains, states she feels nauseous but denies wanting PRN for it. informed pt that she will be going to dialysis today answered all questions.

## 2019-07-26 NOTE — Progress Notes (Signed)
Patient suffers from End Stage Renal Failure requiring Hemodialysis which impairs their ability to perform daily activities like walking, bathing, preparing meals in the home.  A cane or walker will not resolve  issue with performing activities of daily living. A wheelchair will allow patient to safely perform daily activities. Patient is not able to propel themselves in the home using a standard weight wheelchair due to weakness. Patient can self propel in the lightweight wheelchair. Length of need Lifetime. Accessories: elevating leg rests (ELRs), wheel locks, extensions and anti-tippers, seat cushion and back cushion.

## 2019-07-26 NOTE — TOC Initial Note (Signed)
Transition of Care [facility]) - Initial/Assessment Note    Patient Details  Name: Robin Rogers MRN: [id number] Date of Birth: 12-30-1940  Transition of Care [facility]) CM/SW Contact:    Robin Hutching, RN Phone Number: 07/26/2019, 2:43 PM  Clinical Narrative:                 Patient is from home with her husband.  Patient is blind and Spanish Speaking.  The dialect of Spanish is different than what the interpreters here speak but her daughter helps interpret.  Daughter, Robin Rogers is at the bedside.  Robin Rogers reports that the patient walks with a cane at home but they would like to get her a wheelchair and 3 in 1.  Robin Rogers does not think home health would be a good idea do to the language barrier.  PT did not recommend any follow up.  Patient will need outpatient dialysis, Robin Rogers with [facility] is working on Outpatient Chair time.  Patient needs one more treatment this hospitalization before she is able to discharge to the community and needs to be able to sit in a chair for her treatment.  RNCM will cont to follow.    Expected Discharge Plan: Home/Self Care Barriers to Discharge: Continued Medical Work up   Patient Goals and CMS Choice   CMS Medicare.gov Compare Post Acute Care list provided to:: Patient Choice offered to / list presented to : Patient  Expected Discharge Plan and Services Expected Discharge Plan: Home/Self Care In-house Referral: Hospice / [place], Interpreting Services Discharge Planning Services: CM Consult Post Acute Care Choice: Durable Medical Equipment Living arrangements for the past 2 months: Mobile Home                 DME Arranged: 3-N-1, Lightweight manual wheelchair with seat cushion DME Agency: [employer] Date DME Agency Contacted: 07/26/19 Time DME Agency Contacted: [phone] Representative spoke with at DME Agency: Robin Rogers            Prior Living Arrangements/Services Living arrangements for the past 2 months: Mobile Home Lives with::  Spouse Patient language and need for interpreter reviewed:: Yes (Spanish (special dialect)) Do you feel safe going back to the place where you live?: Yes      Need for Family Participation in Patient Care: Yes (Comment) (new dialysis, blind) Care giver support system in place?: Yes (comment) (daughter, husband, son) Current home services: DME (cane) Criminal Activity/Legal Involvement Pertinent to Current Situation/Hospitalization: No - Comment as needed  Activities of Daily Living Home Assistive Devices/Equipment: Cane (specify quad or straight) ADL Screening (condition at time of admission) Patient's cognitive ability adequate to safely complete daily activities?: Yes Is the patient deaf or have difficulty hearing?: No Does the patient have difficulty seeing, even when wearing glasses/contacts?: Yes Does the patient have difficulty concentrating, remembering, or making decisions?: No Patient able to express need for assistance with ADLs?: Yes Does the patient have difficulty dressing or bathing?: No Independently performs ADLs?: Yes (appropriate for developmental age) Does the patient have difficulty walking or climbing stairs?: Yes Weakness of Legs: Both Weakness of Arms/Hands: None  Permission Sought/Granted Permission sought to share information with : Case Manager, Family Supports Permission granted to share information with : Yes, Verbal Permission Granted  Share Information with NAME: Robin Rogers     Permission granted to share info w Relationship: daughter     Emotional Assessment Appearance:: Appears older than stated age Attitude/Demeanor/Rapport: Unable to Assess Affect (typically observed): Unable to Assess  Alcohol / Substance Use: Not Applicable Psych Involvement: No (comment)  Admission diagnosis:  Hyponatremia [E87.1] Chronic anemia [D64.9] Generalized weakness [R53.1] Patient Active Problem List   Diagnosis Date Noted  . CKD (chronic kidney disease), stage V  ([place])   . Acute on chronic HFrEF (heart failure with reduced ejection fraction) ([place])   . Chronic anemia   . CKD (chronic kidney disease), stage IV ([place]) 05/31/2019  . Insomnia 05/31/2019  . Generalized weakness 05/31/2019  . Anemia due to chronic kidney disease 05/31/2019  . Blind in both eyes 05/31/2019  . Hypertensive urgency 05/31/2019  . Hyponatremia 05/30/2019  . ARF (acute renal failure) ([place]) 05/10/2019  . Acute on chronic heart failure with reduced ejection fraction and diastolic dysfunction ([place]) 05/10/2019  . Chronic combined systolic and diastolic CHF (congestive heart failure) ([place]) 10/09/2017  . HTN (hypertension) 10/09/2017  . 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 ([place]) 10/09/2017  . Intractable nausea and vomiting 09/22/2017   PCP:  Center, [place]:   [facility] [address] [place] [address] Phone: [phone] Fax: [place], [place] [address] Phone: [phone] Fax: [phone]  [id number] - [address], [place] [address] [place] [address] Phone: [phone] Fax: [phone]     Social Determinants of Health (SDOH) Interventions    Readmission Risk Interventions Readmission Risk Prevention Plan 06/02/2019  Transportation Screening Complete  HRI or Home Care Consult Complete  Palliative Care Screening Not Applicable  Medication Review (RN Care Manager) Complete  Some recent data might be hidden

## 2019-07-27 DIAGNOSIS — Z7189 Other specified counseling: Secondary | ICD-10-CM

## 2019-07-27 DIAGNOSIS — Z515 Encounter for palliative care: Secondary | ICD-10-CM

## 2019-07-27 DIAGNOSIS — N186 End stage renal disease: Secondary | ICD-10-CM

## 2019-07-27 LAB — CBC
HCT: 23.6 % — ABNORMAL LOW (ref 36.0–46.0)
Hemoglobin: 8.3 g/dL — ABNORMAL LOW (ref 12.0–15.0)
MCH: 32.5 pg (ref 26.0–34.0)
MCHC: 35.2 g/dL (ref 30.0–36.0)
MCV: 92.5 fL (ref 80.0–100.0)
Platelets: 152 10*3/uL (ref 150–400)
RBC: 2.55 MIL/uL — ABNORMAL LOW (ref 3.87–5.11)
RDW: 15 % (ref 11.5–15.5)
WBC: 5 10*3/uL (ref 4.0–10.5)
nRBC: 0 % (ref 0.0–0.2)

## 2019-07-27 LAB — BPAM RBC
Blood Product Expiration Date: 202108122359
ISSUE DATE / TIME: 202107201626
Unit Type and Rh: 5100

## 2019-07-27 LAB — BASIC METABOLIC PANEL
Anion gap: 7 (ref 5–15)
BUN: 17 mg/dL (ref 8–23)
CO2: 29 mmol/L (ref 22–32)
Calcium: 7.3 mg/dL — ABNORMAL LOW (ref 8.9–10.3)
Chloride: 97 mmol/L — ABNORMAL LOW (ref 98–111)
Creatinine, Ser: 2.8 mg/dL — ABNORMAL HIGH (ref 0.44–1.00)
GFR calc Af Amer: 18 mL/min — ABNORMAL LOW (ref >60)
GFR calc non Af Amer: 16 mL/min — ABNORMAL LOW (ref >60)
Glucose, Bld: 84 mg/dL (ref 70–99)
Potassium: 3.8 mmol/L (ref 3.5–5.1)
Sodium: 133 mmol/L — ABNORMAL LOW (ref 135–145)

## 2019-07-27 LAB — QUANTIFERON-TB GOLD PLUS (RQFGPL)
QuantiFERON Mitogen Value: >10 IU/mL
QuantiFERON Nil Value: 0.03 IU/mL
QuantiFERON TB1 Ag Value: 0.44 IU/mL
QuantiFERON TB2 Ag Value: 0.42 IU/mL

## 2019-07-27 LAB — QUANTIFERON-TB GOLD PLUS: QuantiFERON-TB Gold Plus: POSITIVE — AB

## 2019-07-27 LAB — TYPE AND SCREEN
ABO/RH(D): O POS
Antibody Screen: NEGATIVE
Unit division: 0

## 2019-07-27 LAB — GLUCOSE, CAPILLARY: Glucose-Capillary: 91 mg/dL (ref 70–99)

## 2019-07-27 MED ORDER — FUROSEMIDE 40 MG PO TABS: 40.0000 mg | ORAL_TABLET | Freq: Every day | ORAL | 0 refills | Status: DC

## 2019-07-27 NOTE — Progress Notes (Signed)
This note also relates to the following rows which could not be included: Resp - Cannot attach notes to unvalidated device data  Hd completed

## 2019-07-27 NOTE — Discharge Summary (Signed)
Physician Discharge Summary  VICKE PLOTNER PFX:[id number] DOB: 29-Aug-1940 DOA: 07/22/2019  PCP: [facility] date: 07/22/2019 Discharge date: 07/27/2019  Admitted From: Home Disposition: Home   Recommendations for Outpatient Follow-up:  1. Follow up with PCP in 1-2 weeks 2. Follow-up with nephrology 3. Follow your dialysis schedule, Tuesday, Thursday and Saturday. 4. Please obtain BMP/CBC in one week 5. Please follow up on the following pending results: None  Home Health: No Equipment/Devices: None Discharge Condition: Stable CODE STATUS: Full Diet recommendation: Heart Healthy / Carb Modified   Brief/Interim Summary: 79 y/o F w/ PMH of CKDIV, HTN, ACD, HLD,CHF,GERD who presents w/ weakness & no appetite x 1 week. Pt has had multiple falls at home w/ most recently being day prior to admission. Pt denies hitting her head or LOC. Pt c/o feeling full despite not having an appetite. Pt also c/o nausea but no vomiting.  Patient is a [name] and needs interpreter. Patient was seen by [facility] on 5/24 where dialysis was discussed. Patient states she would do dialysis only if it was needed. Patient was then admitted to [facility] from 5/24 to 5/28. She deferred starting dialysis on that admission.  On arrival she had sodium of 120 and admitted for hyponatremia and uremia. Initially was very reluctant to start dialysis.  Later after involving family this decided to proceed with dialysis.  HD catheter was placed by vascular surgery and she was started on dialysis.  She completed initial 3 days and had a chair offer for outpatient dialysis starting from Saturday.  Patient will get her dialysis Tuesday, Thursday and Saturday.  Patient's hyponatremia labs were more consistent with renal disease.  It started improving after dialysis.  On the day of discharge it was 133.  Patient has HFrEF, EF of 40 to 45% on echo done in May 2021.  BNP was elevated  above 4000 and she appears volume overload secondary to becoming ESRD.  Edema improved with dialysis.  Per nephrology she will continue with p.o. Lasix and follow-up with them as an outpatient.  Patient also has anemia of chronic disease and received 1 unit of PRBC during current hospitalization.  No obvious sign of bleeding.  Patient will continue with rest of her home meds and follow-up with her primary care physician for further management.   Discharge Diagnoses:  Active Problems:   Hyponatremia   CKD (chronic kidney disease), stage V (HCC)   Acute on chronic HFrEF (heart failure with reduced ejection fraction) (HCC)   Chronic anemia   ESRD (end stage renal disease) [facility])   Discharge Instructions  Discharge Instructions    Diet - low sodium heart healthy   Complete by: As directed    Discharge instructions   Complete by: As directed    It was pleasure taking care of you. Please follow your schedule for dialysis as directed and follow-up with your nephrologist.   Increase activity slowly   Complete by: As directed      Allergies as of 07/27/2019   No Known Allergies     Medication List    TAKE these medications   aspirin 81 MG EC tablet Take 1 tablet (81 mg total) by mouth daily.   carvedilol 6.25 MG tablet Commonly known as: COREG Take 1 tablet (6.25 mg total) by mouth 2 (two) times daily with a meal.   ferrous sulfate 325 (65 FE) MG tablet Take 325 mg by mouth 2 (two) times daily with a meal.  furosemide 40 MG tablet Commonly known as: LASIX Take 1 tablet (40 mg total) by mouth daily. Start taking on: July 28, 2019 What changed:   how much to take  how to take this  when to take this  additional instructions   hydrALAZINE 10 MG tablet Commonly known as: APRESOLINE Take 1 tablet (10 mg total) by mouth 3 (three) times daily.   lovastatin 40 MG tablet Commonly known as: MEVACOR Take 1 tablet by mouth at bedtime.   melatonin 1 MG Tabs tablet Take 2  mg by mouth at bedtime.   omeprazole 40 MG capsule Commonly known as: PRILOSEC Take 40 mg by mouth daily.   ondansetron 4 MG disintegrating tablet Commonly known as: ZOFRAN-ODT Take 4 mg by mouth every 8 (eight) hours as needed.   sevelamer carbonate 800 MG tablet Commonly known as: RENVELA Take 800 mg by mouth 3 (three) times daily with meals.   sodium bicarbonate 650 MG tablet Take 650 mg by mouth 2 (two) times daily.   traZODone 50 MG tablet Commonly known as: DESYREL Take 50 mg by mouth at bedtime.   Vitamin D (Ergocalciferol) 1.25 MG (50000 UNIT) Caps capsule Commonly known as: DRISDOL Take 50,000 Units by mouth every 7 (seven) days. (SATURDAY)            Durable Medical Equipment  (From admission, onward)         Start     Ordered   07/26/19 1430  For home use only DME 3 n 1  Once        07/26/19 1429   07/26/19 1428  For home use only DME lightweight manual wheelchair with seat cushion  Once       Comments: Patient suffers from End Stage Renal Failure requiring Hemodialysis which impairs their ability to perform daily activities like walking, bathing, preparing meals in the home.  A cane or walker will not resolve  issue with performing activities of daily living. A wheelchair will allow patient to safely perform daily activities. Patient is not able to propel themselves in the home using a standard weight wheelchair due to weakness. Patient can self propel in the lightweight wheelchair. Length of need Lifetime. Accessories: elevating leg rests (ELRs), wheel locks, extensions and anti-tippers, seat cushion and back cushion.   07/26/19 [place], [place]. Schedule an appointment as soon as possible for a visit.   Specialty: General Practice Contact information: [place] [address] [phone]        Minna Merritts, MD .   Specialty: Cardiology Contact  information: [place] [address] [phone]              No Known Allergies  Consultations:  Nephrology  Procedures/Studies: CT ABDOMEN PELVIS WO CONTRAST  Result Date: 07/24/2019 CLINICAL DATA:  Anemia.  Multiple recent falls.  Nausea. EXAM: CT ABDOMEN AND PELVIS WITHOUT CONTRAST TECHNIQUE: Multidetector CT imaging of the abdomen and pelvis was performed following the standard protocol without IV contrast. COMPARISON:  07/22/2019 FINDINGS: Lower chest: Stable enlarged heart and moderate-sized pericardial effusion. Small bilateral pleural effusions, larger on the left and new on the right. 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 Hepatobiliary: No focal liver abnormality is seen. Status post cholecystectomy. No biliary dilatation. Pancreas: Unremarkable. No pancreatic ductal dilatation or surrounding inflammatory changes. Spleen: Normal in size without focal abnormality.  Adrenals/Urinary Tract: Adrenal glands are unremarkable. Kidneys are normal, without renal calculi, focal lesion, or hydronephrosis. Bladder is unremarkable. Stomach/Bowel: Stomach is within normal limits. Appendix appears normal. No evidence of bowel wall thickening, distention, or inflammatory changes. Vascular/Lymphatic: Atheromatous arterial calcifications without aneurysm. No enlarged lymph nodes. Reproductive: Uterus and bilateral adnexa are unremarkable. Other: Diffuse subcutaneous edema with improvement. Musculoskeletal: Lumbar spine degenerative changes and mild scoliosis. IMPRESSION: 1. No acute abnormality. 2. Small bilateral pleural effusions, larger on the left and new on the right. 3. Stable cardiomegaly and moderate-sized pericardial effusion. 4. 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 Electronically Signed   By: Claudie Revering M.D.   On: 07/24/2019 16:06   CT ABDOMEN PELVIS WO CONTRAST  Result Date: 07/22/2019 CLINICAL  DATA:  Abdominal pain, weakness, anorexia, fell yesterday EXAM: CT ABDOMEN AND PELVIS WITHOUT CONTRAST TECHNIQUE: Multidetector CT imaging of the abdomen and pelvis was performed following the standard protocol without IV contrast. COMPARISON:  09/22/2017 FINDINGS: Lower chest: There is chronic bilateral pleural thickening, with pleural calcification at the right costophrenic angle. Stable small pericardial effusion. Hepatobiliary: No focal liver abnormality is seen. Status post cholecystectomy. No biliary dilatation. Pancreas: Unremarkable. No pancreatic ductal dilatation or surrounding inflammatory changes. Spleen: Normal in size without focal abnormality. Adrenals/Urinary Tract: No urinary tract calculi or obstructive uropathy. There is bilateral renal cortical atrophy. Bladder is unremarkable. Stable adrenal glands. Stomach/Bowel: No bowel obstruction or ileus. Normal appendix right lower quadrant. No bowel wall thickening or inflammatory change. Vascular/Lymphatic: Aortic atherosclerosis. No enlarged abdominal or pelvic lymph nodes. Reproductive: Uterus and bilateral adnexa are unremarkable. Other: There is diffuse body wall edema greatest in the bilateral flanks. No free fluid or free gas within the peritoneal cavity. No abdominal wall hernia. Musculoskeletal: No acute or destructive bony lesions. Reconstructed images demonstrate no additional findings. IMPRESSION: 1. No acute intra-abdominal or intrapelvic process. 2. Diffuse body wall edema. 3. Aortic Atherosclerosis (ICD10-I70.0). Electronically Signed   By: Randa Ngo M.D.   On: 07/22/2019 15:27   DG Chest 2 View  Result Date: 07/26/2019 CLINICAL DATA:  Weakness. EXAM: CHEST - 2 VIEW COMPARISON:  May 10, 2019. FINDINGS: Stable cardiomegaly. Left internal jugular Port-A-Cath is noted with distal tips in the SVC. No pneumothorax or pleural effusion is noted. Small pleural effusions may be present. No definite consolidative process is noted. Mild  central pulmonary vascular congestion is noted. Bony thorax is unremarkable. IMPRESSION: Stable cardiomegaly with mild central pulmonary vascular congestion. Small pleural effusions may be present. Aortic Atherosclerosis (ICD10-I70.0). Electronically Signed   By: Marijo Conception M.D.   On: 07/26/2019 16:24   PERIPHERAL VASCULAR CATHETERIZATION  Result Date: 07/25/2019 See op note    Subjective: Patient was seen today during dialysis.  No new complaint today.  Discharge Exam: Vitals:   07/27/19 1405 07/27/19 1543  BP: (!) 172/73 (!) 156/65  Pulse: 86 90  Resp: 18 14  Temp: 98.9 F (37.2 C) 98.9 F (37.2 C)  SpO2: 98% 96%   Vitals:   07/27/19 1315 07/27/19 1330 07/27/19 1405 07/27/19 1543  BP:  (!) 164/68 (!) 172/73 (!) 156/65  Pulse: 75  86 90  Resp: 15 13 18 14   Temp:  98.5 F (36.9 C) 98.9 F (37.2 C) 98.9 F (37.2 C)  TempSrc:  Oral Oral Oral  SpO2:   98% 96%  Weight:      Height:        General: Pt is alert, awake, not in acute  distress Cardiovascular: RRR, S1/S2 +, no rubs, no gallops Respiratory: CTA bilaterally, no wheezing, no rhonchi Abdominal: Soft, NT, ND, bowel sounds + Extremities: Trace edema, no cyanosis   The results of significant diagnostics from this hospitalization (including imaging, microbiology, ancillary and laboratory) are listed below for reference.    Microbiology: Recent Results (from the past 240 hour(s))  SARS Coronavirus 2 by RT PCR (hospital order, performed in [facility] hospital lab) Nasopharyngeal     Status: None   Collection Time: 07/22/19  2:57 PM   Specimen: Nasopharyngeal  Result Value Ref Range Status   SARS Coronavirus 2 NEGATIVE NEGATIVE Final    Comment: (NOTE) SARS-CoV-2 target nucleic acids are NOT DETECTED.  The SARS-CoV-2 RNA is generally detectable in upper and lower respiratory specimens during the acute phase of infection. The lowest concentration of SARS-CoV-2 viral copies this assay can detect is  250 copies / mL. A negative result does not preclude SARS-CoV-2 infection and should not be used as the sole basis for treatment or other patient management decisions.  A negative result may occur with improper specimen collection / handling, submission of specimen other than nasopharyngeal swab, presence of viral mutation(s) within the areas targeted by this assay, and inadequate number of viral copies (<250 copies / mL). A negative result must be combined with clinical observations, patient history, and epidemiological information.  Fact Sheet for Patients:   StrictlyIdeas.no  Fact Sheet for Healthcare Providers: BankingDealers.co.za  This test is not yet approved or  cleared by the [place] FDA and has been authorized for detection and/or diagnosis of SARS-CoV-2 by FDA under an Emergency Use Authorization (EUA).  This EUA will remain in effect (meaning this test can be used) for the duration of the COVID-19 declaration under Section 564(b)(1) of the Act, 21 U.S.C. section 360bbb-3(b)(1), unless the authorization is terminated or revoked sooner.  Performed at [facility], [address] [place], [place] [address]      Labs: BNP (last 3 results) Recent Labs    05/10/19 1605 07/23/19 0902  BNP 702.0* 5,465.0*   Basic Metabolic Panel: Recent Labs  Lab 07/23/19 0530 07/23/19 0530 07/23/19 0902 07/24/19 0333 07/25/19 0441 07/26/19 0506 07/27/19 0540  NA 124*  125*   < > 124* 124* 123* 131* 133*  K 4.4  --   --  4.4 4.7 4.4 3.8  CL 93*  --   --  95* 93* 97* 97*  CO2 23  --   --  23 24 29 29   GLUCOSE 85  --   --  75 86 84 84  BUN 47*  --   --  48* 49* 32* 17  CREATININE 4.46*  --   --  4.45* 4.74* 3.62* 2.80*  CALCIUM 7.6*  --   --  7.1* 7.1* 7.2* 7.3*  PHOS  --   --   --   --   --  3.8  --    < > = values in this interval not displayed.   Liver Function Tests: Recent Labs  Lab 07/26/19 0506   ALBUMIN 2.2*   No results for input(s): LIPASE, AMYLASE in the last 168 hours. No results for input(s): AMMONIA in the last 168 hours. CBC: Recent Labs  Lab 07/23/19 0530 07/23/19 2043 07/24/19 0333 07/24/19 1211 07/25/19 0441 07/26/19 0506 07/27/19 0540  WBC 4.6  --  3.8*  --  4.0 4.5 5.0  HGB 6.9*   < > 7.1* 8.0* 7.5* 6.8* 8.3*  HCT  20.1*   < > 21.3* 23.0* 21.6* 21.1* 23.6*  MCV 92.2  --  94.2  --  90.4 96.3 92.5  PLT 182  --  160  --  163 157 152   < > = values in this interval not displayed.   Cardiac Enzymes: No results for input(s): CKTOTAL, CKMB, CKMBINDEX, TROPONINI in the last 168 hours. BNP: Invalid input(s): POCBNP CBG: Recent Labs  Lab 07/26/19 0751 07/26/19 1334 07/26/19 1554 07/26/19 2055 07/27/19 0808  GLUCAP 82 105* 110* 125* 91   D-Dimer No results for input(s): DDIMER in the last 72 hours. Hgb A1c No results for input(s): HGBA1C in the last 72 hours. Lipid Profile No results for input(s): CHOL, HDL, LDLCALC, TRIG, CHOLHDL, LDLDIRECT in the last 72 hours. Thyroid function studies No results for input(s): TSH, T4TOTAL, T3FREE, THYROIDAB in the last 72 hours.  Invalid input(s): FREET3 Anemia work up No results for input(s): VITAMINB12, FOLATE, FERRITIN, TIBC, IRON, RETICCTPCT in the last 72 hours. Urinalysis    Component Value Date/Time   COLORURINE YELLOW (A) 07/22/2019 1501   APPEARANCEUR HAZY (A) 07/22/2019 1501   LABSPEC 1.008 07/22/2019 1501   PHURINE 5.0 07/22/2019 1501   GLUCOSEU 50 (A) 07/22/2019 1501   HGBUR NEGATIVE 07/22/2019 1501   BILIRUBINUR NEGATIVE 07/22/2019 1501   KETONESUR NEGATIVE 07/22/2019 1501   PROTEINUR >=300 (A) 07/22/2019 1501   NITRITE NEGATIVE 07/22/2019 1501   LEUKOCYTESUR NEGATIVE 07/22/2019 1501   Sepsis Labs Invalid input(s): PROCALCITONIN,  WBC,  LACTICIDVEN Microbiology Recent Results (from the past 240 hour(s))  SARS Coronavirus 2 by RT PCR (hospital order, performed in [facility] hospital lab)  Nasopharyngeal     Status: None   Collection Time: 07/22/19  2:57 PM   Specimen: Nasopharyngeal  Result Value Ref Range Status   SARS Coronavirus 2 NEGATIVE NEGATIVE Final    Comment: (NOTE) SARS-CoV-2 target nucleic acids are NOT DETECTED.  The SARS-CoV-2 RNA is generally detectable in upper and lower respiratory specimens during the acute phase of infection. The lowest concentration of SARS-CoV-2 viral copies this assay can detect is 250 copies / mL. A negative result does not preclude SARS-CoV-2 infection and should not be used as the sole basis for treatment or other patient management decisions.  A negative result may occur with improper specimen collection / handling, submission of specimen other than nasopharyngeal swab, presence of viral mutation(s) within the areas targeted by this assay, and inadequate number of viral copies (<250 copies / mL). A negative result must be combined with clinical observations, patient history, and epidemiological information.  Fact Sheet for Patients:   StrictlyIdeas.no  Fact Sheet for Healthcare Providers: BankingDealers.co.za  This test is not yet approved or  cleared by the [place] FDA and has been authorized for detection and/or diagnosis of SARS-CoV-2 by FDA under an Emergency Use Authorization (EUA).  This EUA will remain in effect (meaning this test can be used) for the duration of the COVID-19 declaration under Section 564(b)(1) of the Act, 21 U.S.C. section 360bbb-3(b)(1), unless the authorization is terminated or revoked sooner.  Performed at [facility], [address] [place], [place] [address]     Time coordinating discharge: Over 30 minutes  SIGNED:  Lorella Nimrod, MD  [facility] 07/27/2019, 3:49 PM  If 7PM-7AM, please contact night-coverage [email or web address]  This record has been created using Systems analyst. Errors have been  sought and corrected,but may not always be located. Such creation errors do not reflect on the standard  of care.

## 2019-07-27 NOTE — Progress Notes (Signed)
Called daughter Angle and reviewed AVS with her over phone. Answered all questions.

## 2019-07-27 NOTE — Care Management Important Message (Signed)
Important Message  Patient Details  Name: GRETHEL ZENK MRN: [id number] Date of Birth: 1940/09/28   Medicare Important Message Given:  Yes  Initial Medicare IM given by Patient Access Associate on 07/27/2019 at 10:03am.     Dannette Barbara 07/27/2019, 2:34 PM

## 2019-07-27 NOTE — Progress Notes (Signed)
[facility]  ROUNDING NOTE   Subjective:   Second hemodialysis treatment yesterday. Tolerated treatment well.  Seen and examined on hemodialysis treatment. Tolerating third treatment well. Seated in chair.   History taken with assistance of Patent examiner. Patient able to understand short simple phrases in Spanish    HEMODIALYSIS FLOWSHEET:  Blood Flow Rate (mL/min): 300 mL/min Arterial Pressure (mmHg): -100 mmHg Venous Pressure (mmHg): 210 mmHg Transmembrane Pressure (mmHg): 60 mmHg Ultrafiltration Rate (mL/min): 500 mL/min Dialysate Flow Rate (mL/min): 600 ml/min Conductivity: Machine : 14.1 Conductivity: Machine : 14.1 Dialysis Fluid Bolus: Normal Saline Bolus Amount (mL): 250 mL    Objective:  Vital signs in last 24 hours:  Temp:  [97.5 F (36.4 C)-98.9 F (37.2 C)] 98.9 F (37.2 C) (07/21 1025) Pulse Rate:  [66-86] 66 (07/21 1215) Resp:  [10-18] 10 (07/21 1215) BP: (120-171)/(43-71) 158/54 (07/21 1215) SpO2:  [93 %-100 %] 97 % (07/21 1025)  Weight change:  Filed Weights   07/22/19 1205 07/24/19 0500 07/25/19 0408  Weight: 68 kg 71.9 kg 71.8 kg    Intake/Output: I/O last 3 completed shifts: In: 722 [P.O.:240; Blood:482] Out: 500 [Other:500]   Intake/Output this shift:  No intake/output data recorded.  Physical Exam: General: NAD, sitting up in bed  Head: Normocephalic, atraumatic. Moist oral mucosal membranes  Eyes: +blind  Neck: Supple, trachea midline  Lungs:  Basilar crackles  Heart: Regular rate and rhythm  Abdomen:  Soft, nontender,   Extremities:  trace peripheral edema.  Neurologic: Nonfocal, moving all four extremities  Skin: No lesions  Access: LIJ permcath 7/19 Dr. Lucky Cowboy    Basic Metabolic Panel: Recent Labs  Lab 07/23/19 0530 07/23/19 0530 07/23/19 0902 07/24/19 1194 07/24/19 1740 07/25/19 0441 07/26/19 0506 07/27/19 0540  NA 124*  125*   < > 124* 124*  --  123* 131* 133*  K 4.4  --   --  4.4  --  4.7  4.4 3.8  CL 93*  --   --  95*  --  93* 97* 97*  CO2 23  --   --  23  --  24 29 29   GLUCOSE 85  --   --  75  --  86 84 84  BUN 47*  --   --  48*  --  49* 32* 17  CREATININE 4.46*  --   --  4.45*  --  4.74* 3.62* 2.80*  CALCIUM 7.6*   < >  --  7.1*   < > 7.1* 7.2* 7.3*  PHOS  --   --   --   --   --   --  3.8  --    < > = values in this interval not displayed.    Liver Function Tests: Recent Labs  Lab 07/26/19 0506  ALBUMIN 2.2*   No results for input(s): LIPASE, AMYLASE in the last 168 hours. No results for input(s): AMMONIA in the last 168 hours.  CBC: Recent Labs  Lab 07/23/19 0530 07/23/19 2043 07/24/19 0333 07/24/19 1211 07/25/19 0441 07/26/19 0506 07/27/19 0540  WBC 4.6  --  3.8*  --  4.0 4.5 5.0  HGB 6.9*   < > 7.1* 8.0* 7.5* 6.8* 8.3*  HCT 20.1*   < > 21.3* 23.0* 21.6* 21.1* 23.6*  MCV 92.2  --  94.2  --  90.4 96.3 92.5  PLT 182  --  160  --  163 157 152   < > = values in this interval not displayed.  Cardiac Enzymes: No results for input(s): CKTOTAL, CKMB, CKMBINDEX, TROPONINI in the last 168 hours.  BNP: Invalid input(s): POCBNP  CBG: Recent Labs  Lab 07/26/19 0751 07/26/19 1334 07/26/19 1554 07/26/19 2055 07/27/19 0808  GLUCAP 82 105* 110* 125* 91    Microbiology: Results for orders placed or performed during the hospital encounter of 07/22/19  SARS Coronavirus 2 by RT PCR (hospital order, performed in [facility] hospital lab) Nasopharyngeal     Status: None   Collection Time: 07/22/19  2:57 PM   Specimen: Nasopharyngeal  Result Value Ref Range Status   SARS Coronavirus 2 NEGATIVE NEGATIVE Final    Comment: (NOTE) SARS-CoV-2 target nucleic acids are NOT DETECTED.  The SARS-CoV-2 RNA is generally detectable in upper and lower respiratory specimens during the acute phase of infection. The lowest concentration of SARS-CoV-2 viral copies this assay can detect is 250 copies / mL. A negative result does not preclude SARS-CoV-2 infection and  should not be used as the sole basis for treatment or other patient management decisions.  A negative result may occur with improper specimen collection / handling, submission of specimen other than nasopharyngeal swab, presence of viral mutation(s) within the areas targeted by this assay, and inadequate number of viral copies (<250 copies / mL). A negative result must be combined with clinical observations, patient history, and epidemiological information.  Fact Sheet for Patients:   StrictlyIdeas.no  Fact Sheet for Healthcare Providers: BankingDealers.co.za  This test is not yet approved or  cleared by the [place] FDA and has been authorized for detection and/or diagnosis of SARS-CoV-2 by FDA under an Emergency Use Authorization (EUA).  This EUA will remain in effect (meaning this test can be used) for the duration of the COVID-19 declaration under Section 564(b)(1) of the Act, 21 U.S.C. section 360bbb-3(b)(1), unless the authorization is terminated or revoked sooner.  Performed at [facility], [address] [place], [place] [address]     Coagulation Studies: No results for input(s): LABPROT, INR in the last 72 hours.  Urinalysis: No results for input(s): COLORURINE, LABSPEC, PHURINE, GLUCOSEU, HGBUR, BILIRUBINUR, KETONESUR, PROTEINUR, UROBILINOGEN, NITRITE, LEUKOCYTESUR in the last 72 hours.  Invalid input(s): APPERANCEUR    Imaging: DG Chest 2 View  Result Date: 07/26/2019 CLINICAL DATA:  Weakness. EXAM: CHEST - 2 VIEW COMPARISON:  May 10, 2019. FINDINGS: Stable cardiomegaly. Left internal jugular Port-A-Cath is noted with distal tips in the SVC. No pneumothorax or pleural effusion is noted. Small pleural effusions may be present. No definite consolidative process is noted. Mild central pulmonary vascular congestion is noted. Bony thorax is unremarkable. IMPRESSION: Stable cardiomegaly with mild central  pulmonary vascular congestion. Small pleural effusions may be present. Aortic Atherosclerosis (ICD10-I70.0). Electronically Signed   By: Marijo Conception M.D.   On: 07/26/2019 16:24     Medications:    . sodium chloride   Intravenous Once  . aspirin EC  81 mg Oral Daily  . carvedilol  6.25 mg Oral BID WC  . Chlorhexidine Gluconate Cloth  6 each Topical Q0600  . docusate sodium  100 mg Oral BID  . feeding supplement (ENSURE ENLIVE)  237 mL Oral BID BM  . ferrous sulfate  325 mg Oral BID WC  . furosemide  40 mg Oral Daily  . heparin  5,000 Units Subcutaneous Q8H  . hydrALAZINE  10 mg Oral TID  . insulin aspart  0-9 Units Subcutaneous TID WC  . multivitamin  1 tablet Oral QHS  . pantoprazole  40 mg Oral Daily  . pravastatin  40 mg Oral q1800  . sodium chloride flush  3 mL Intravenous Once  . sodium chloride flush  3 mL Intravenous Q12H  . traZODone  50 mg Oral QHS   acetaminophen **OR** acetaminophen, bisacodyl, morphine injection, ondansetron **OR** ondansetron (ZOFRAN) IV, traMADol, traZODone  Assessment/ Plan:  Ms. Robin Rogers is a 79 y.o. hispanic female with congestive heart failure, hypertension, diabetes mellitus type 2, blindness, who is admitted to [facility] on 07/22/2019 for Hyponatremia [E87.1] Chronic anemia [D64.9] Generalized weakness [R53.1]  1. End stage renal disease. Third hemodialysis treatment today  2. Anemia of chronic kidney disease : status post PRBC transfusion on 7/17 and 7/20 - EPO with HD treatment starting with next treatment.   3. Hyponatremia: secondary to congestive heart failure and renal failure. Volume overloaded Improved on dialysis.   4. Secondary Hyperparathyroidism: PTH 200 on 5/9. With hypocalcemia. Not currently on binders or vitamin D agents.  Phosphorus at goal.   5. Hypertension:  - carvedilol, hydralazine and furosemide.    LOS: 5 Robin Rogers 7/21/202112:51 PM

## 2019-07-27 NOTE — TOC Transition Note (Signed)
Transition of Care [facility]) - CM/SW Discharge Note   Patient Details  Name: Robin Rogers MRN: [id number] Date of Birth: 31-Dec-1940  [facility]) CM/SW Contact:  Elease Hashimoto, LCSW Phone Number: 07/27/2019, 3:53 PM   Clinical Narrative:   Pt has a chair slot for HD and Amanda-Pt Coordinator has informed daughter of plan and when to do paperwork. Have let [employer] know of DC son 3 in1 and wheelchair to be delivered. No follow up therapy recommended. Pt to go home with family and have 24 hr supervision. Per MD she reports pt can go until Sat for next HD treatment. Set for DC today.    Final next level of care: Home/Self Care Barriers to Discharge: Barriers Resolved   Patient Goals and CMS Choice   CMS Medicare.gov Compare Post Acute Care list provided to:: Patient Choice offered to / list presented to : Patient  Discharge Placement                Patient to be transferred to facility by: Angel-daughter to transport via car Name of family member notified: [name] Patient and family notified of of transfer: 07/27/19  Discharge Plan and Services In-house Referral: Hospice / [place], Interpreting Services Discharge Planning Services: CM Consult Post Acute Care Choice: Durable Medical Equipment          DME Arranged: 3-N-1, Lightweight manual wheelchair with seat cushion DME Agency: [employer] Date DME Agency Contacted: 07/26/19 Time DME Agency Contacted: [id number] Representative spoke with at DME Agency: [place] (SDOH) Interventions     Readmission Risk Interventions Readmission Risk Prevention Plan 06/02/2019  Transportation Screening Complete  HRI or Home Care Consult Complete  Palliative Care Screening Not Applicable  Medication Review (RN Care Manager) Complete  Some recent data might be hidden

## 2019-07-27 NOTE — Progress Notes (Signed)
Patient accepted at [facility] TTS 10:45. Patient can start on Saturday 7/ 24. Plan is for patient's daughter to go to clinic to fill out paperwork on Friday.

## 2019-07-27 NOTE — Progress Notes (Signed)
Interrupter [id number] used for assessment. denies pain and needing to use bathroom. Informed pt she will be going to HD soon and she will transfer to the chair.

## 2019-07-27 NOTE — Progress Notes (Signed)
This note also relates to the following rows which could not be included: Resp - Cannot attach notes to unvalidated device data  Hd started

## 2019-08-01 ENCOUNTER — Encounter: Payer: Self-pay | Admitting: Cardiovascular Disease

## 2019-08-05 ENCOUNTER — Ambulatory Visit (INDEPENDENT_AMBULATORY_CARE_PROVIDER_SITE_OTHER): Payer: Medicare Other | Admitting: Family

## 2019-08-05 ENCOUNTER — Encounter: Payer: Self-pay | Admitting: Family

## 2019-08-05 VITALS — BP 150/54 | HR 64 | Ht 62.0 in | Wt 154.4 lb

## 2019-08-05 DIAGNOSIS — I5042 Chronic combined systolic (congestive) and diastolic (congestive) heart failure: Secondary | ICD-10-CM

## 2019-08-05 DIAGNOSIS — I739 Peripheral vascular disease, unspecified: Secondary | ICD-10-CM

## 2019-08-05 DIAGNOSIS — N186 End stage renal disease: Secondary | ICD-10-CM

## 2019-08-05 DIAGNOSIS — D638 Anemia in other chronic diseases classified elsewhere: Secondary | ICD-10-CM | POA: Diagnosis not present

## 2019-08-05 DIAGNOSIS — R0600 Dyspnea, unspecified: Secondary | ICD-10-CM

## 2019-08-05 DIAGNOSIS — I10 Essential (primary) hypertension: Secondary | ICD-10-CM

## 2019-08-05 MED ORDER — CARVEDILOL 6.25 MG PO TABS: 6.2500 mg | ORAL_TABLET | Freq: Two times a day (BID) | ORAL | 2 refills | Status: AC

## 2019-08-05 NOTE — Progress Notes (Signed)
Office Visit    Patient Name: Robin Rogers Date of Encounter: 08/05/2019  Primary Care Provider:  [facility] [place] Primary Cardiologist:  Ida Rogue, MD Electrophysiologist:  None   Chief Complaint    Robin Rogers is a 79 y.o. female with a hx of chronic combined systolic and diastolic heart failure, HTN, diet-controlled DM2, ESRD on HD, anemia, blindness presents today for hospital follow-up  Past Medical History    Past Medical History:  Diagnosis Date  . Anemia of chronic disease   . Blindness   . Chronic combined systolic (congestive) and diastolic (congestive) heart failure ([place])    a. 09/2017 Echo: EF 45-50%, Gr2 DD; b. 05/2019 Echo: EF 40-45%, Gr III DD (restrictive), nl RV fxn, mod elev PASP. Mod dil LA. Mild MR.  . CKD (chronic kidney disease), stage IV ([place])   . Diet-controlled diabetes mellitus ([place])   . Hypertension    Past Surgical History:  Procedure Laterality Date  . CHOLECYSTECTOMY    . DIALYSIS/PERMA CATHETER INSERTION N/A 07/25/2019   Procedure: DIALYSIS/PERMA CATHETER INSERTION;  Surgeon: Algernon Huxley, MD;  Location: [facility];  Service: Cardiovascular;  Laterality: N/A;  . ESOPHAGOGASTRODUODENOSCOPY N/A 09/24/2017   Procedure: ESOPHAGOGASTRODUODENOSCOPY (EGD);  Surgeon: Toledo, Benay Pike, MD;  Location: [facility] ENDOSCOPY;  Service: Gastroenterology;  Laterality: N/A;    Allergies  No Known Allergies  History of Present Illness    Robin Rogers is a 79 y.o. female with a hx of chronic combined systolic and diastolic heart failure, HTN, diet-controlled DM2, ESRD on HD, anemia, blindness  last seen by Darylene Price, NP 05/24/19.  She was hospitalized May 10, 2019 with acute on chronic combined systolic and diastolic heart failure.  Prior to that she was last seen 09/2017 cardiology and had otherwise been followed by her PCP and done well.  Echo 05/11/2019 LVEF 40 to 45% with global LV hypokinesis, grade 3 diastolic  dysfunction, RV normal size and function, moderately elevated PASP, LA moderately dilated, mild MR, right atrial pressure 15 mmHg.  During admission she was transfused 2 units of PRBC for hemoglobin of 7.8.  Her diuretics were adjusted by nephrology.  Seen in heart failure clinic 05/24/2019 after follow-up and doing overall well.  She was readmitted 05/30/2019 with hyponatremia.  Her sodium was 114 on presentation.  Her Lasix was held.  She was discharged 06/03/2019.  Dialysis was discussed during admission but she declined.  She was readmitted 07/22/2019 with weakness multiple falls at home.  On admission due to sodium of 120.  Family was involved and she was agreeable to proceed with dialysis.  HD catheter was placed by vascular surgery.  She required 1 unit of PRBC during admission.  She was recommended for Lasix 40 mg daily on discharge by nephrology.  Present today with her family member and an interpretor. Feeling overall well since hospital discharge. Tolerating dialysis well. No lightheadedness, dizziness, chest pain. Reports no dyspnea.  EKGs/Labs/Other Studies Reviewed:   The following studies were reviewed today:   EKG:  EKG is  ordered today.  The ekg ordered today demonstrates NSR 64 bpm with TWI in anterior leads. Stable compared to previous.   Recent Labs: 06/03/2019: Magnesium 2.2 07/23/2019: B Natriuretic Peptide 4,138.4; TSH 1.310 07/27/2019: BUN 17; Creatinine, Ser 2.80; Hemoglobin 8.3; Platelets 152; Potassium 3.8; Sodium 133  Recent Lipid Panel No results found for: CHOL, TRIG, HDL, CHOLHDL, VLDL, LDLCALC, LDLDIRECT  Home Medications   Current Meds  Medication  Sig  . fluticasone (FLONASE) 50 MCG/ACT nasal spray   . lactulose (CONSTULOSE) 10 GM/15ML solution TAKE 15MLS BY MOUTH THREE TIMES DAILY  . polyethylene glycol powder (GLYCOLAX/MIRALAX) 17 GM/SCOOP powder Take by mouth.      Review of Systems       Review of Systems  Constitutional: Negative for chills, fever  and malaise/fatigue.  Cardiovascular: Negative for chest pain, dyspnea on exertion, irregular heartbeat, leg swelling, near-syncope, orthopnea, palpitations and syncope.  Respiratory: Negative for cough, shortness of breath and wheezing.   Gastrointestinal: Negative for melena, nausea and vomiting.  Genitourinary: Negative for hematuria.  Neurological: Negative for dizziness, light-headedness and weakness.   All other systems reviewed and are otherwise negative except as noted above.  Physical Exam    VS:  There were no vitals taken for this visit. , BMI There is no height or weight on file to calculate BMI. GEN: Well nourished, overweight, well developed, in no acute distress. HEENT: normal. Neck: Supple, no JVD, carotid bruits, or masses. Cardiac: RRR, no murmurs, rubs, or gallops. No clubbing, cyanosis, edema.  Radials/DP/PT 2+ and equal bilaterally.  Respiratory:  Respirations regular and unlabored, clear to auscultation bilaterally. GI: Soft, nontender, nondistended, BS + x 4. MS: No deformity or atrophy. Skin: Warm and dry, no rash. Neuro:  Strength and sensation are intact. Psych: Normal affect.  Assessment & Plan    1. Chronic systolic and diastolic heart failure - Volume management per nephrology. Euvolemic on exam. NYHA II. GDMT includes Coreg and Hydralazine, refills provided. Lasix 40mg  daily per nephrology. No ACE/ARB/MRA/ARNI due to ESRD. Low sodium diet and fluid restriction <2L encouraged.   2. PAD -known aortic atherosclerosis by CT. Demand ischemia during recent admission and newly reduced LVEF - plan for Lexiscan Myoview to rule out coronary disease.   3. HTN - BP well controlled. Continue present antihypertensive regimen.   4. ESRD on HD- Volume management per nephrology. Tolerating well. Reports no hypotension with HD.   5. Anemia (likely of chronic disease) - Reports no bleeding. Recent Hb by PCP was 8.9.  Disposition: Follow up in 2 month(s) with Dr. Rockey Situ  or APP   Loel Dubonnet, NP 08/05/2019, 8:40 AM

## 2019-08-05 NOTE — Patient Instructions (Addendum)
Medication Instructions:  No medication changes today.  *If you need a refill on your cardiac medications before your next appointment, please call your pharmacy*   Lab Work: No lab work today.   If you have labs (blood work) drawn today and your tests are completely normal, you will receive your results only by: Marland Kitchen MyChart Message (if you have MyChart) OR . A paper copy in the mail If you have any lab test that is abnormal or we need to change your treatment, we will call you to review the results.   Testing/Procedures:  [place]  Your caregiver has ordered a Stress Test with nuclear imaging. The purpose of this test is to evaluate the blood supply to your heart muscle. This procedure is referred to as a "Non-Invasive Stress Test." This is because other than having an IV started in your vein, nothing is inserted or "invades" your body. Cardiac stress tests are done to find areas of poor blood flow to the heart by determining the extent of coronary artery disease (CAD). Some patients exercise on a treadmill, which naturally increases the blood flow to your heart, while others who are  unable to walk on a treadmill due to physical limitations have a pharmacologic/chemical stress agent called Lexiscan . This medicine will mimic walking on a treadmill by temporarily increasing your coronary blood flow.   Please note: these test may take anywhere between 2-4 hours to complete  PLEASE REPORT TO [facility] MEDICAL MALL ENTRANCE  THE VOLUNTEERS AT THE FIRST DESK WILL DIRECT YOU WHERE TO GO  Date of Procedure:_____________________________________  Arrival Time for Procedure:______________________________  Instructions regarding medication:   ____ : Hold diabetes medication morning of procedure  ____:  Hold betablocker(s) night before procedure and morning of procedure  ____:  Hold other medications as    PLEASE NOTIFY THE OFFICE AT LEAST 24 HOURS IN ADVANCE IF YOU ARE UNABLE TO KEEP YOUR APPOINTMENT.  [phone] AND  PLEASE NOTIFY NUCLEAR MEDICINE AT [facility] AT LEAST 24 HOURS IN ADVANCE IF YOU ARE UNABLE TO KEEP YOUR APPOINTMENT. [phone]  How to prepare for your Myoview test:  1. Do not eat or drink after midnight 2. No caffeine for 24 hours prior to test 3. No smoking 24 hours prior to test. 4. Your medication may be taken with water.  If your doctor stopped a medication because of this test, do not take that medication. 5. Ladies, please do not wear dresses.  Skirts or pants are appropriate. Please wear a short sleeve shirt. 6. No perfume, cologne or lotion. 7. Wear comfortable walking shoes.    Follow-Up: At [facility], you and your health needs are our priority.  As part of our continuing mission to provide you with exceptional heart care, we have created designated Provider Care Teams.  These Care Teams include your primary Cardiologist (physician) and Advanced Practice Providers (APPs -  Physician Assistants and Nurse Practitioners) who all work together to provide you with the care you need, when you need it.  We recommend signing up for the patient portal called "MyChart".  Sign up information is provided on this After Visit Summary.  MyChart is used to connect with patients for Virtual Visits (Telemedicine).  Patients are able to view lab/test results, encounter notes, upcoming appointments, etc.  Non-urgent messages can be sent to your provider as well.   To learn more about what you can do with MyChart, go to [email or web address].    Your next appointment:   2  month(s)  The format  for your next appointment:   In Person  Provider:   You may see Ida Rogue, MD or one of the following Advanced Practice Providers on your designated Care Team:    Murray Hodgkins, NP  Christell Faith, PA-C  Marrianne Mood, PA-C  Laurann Montana, NP     [facility] cardaca Cardiac Nuclear Scan Dannielle Karvonen cardaca es una prueba que mide el flujo de sangre al corazn cuando una persona est en reposo y cuando ejercita. La prueba puede detectar si:  No llega suficiente sangre a una regin del corazn.  El msculo del corazn no funciona normalmente. Puede ser [employer] le hagan esta prueba si:  Wilma Flavin enfermedad cardaca.  Ha tenido resultados de laboratorio anormales.  Ha tenido una ciruga cardaca o un procedimiento de baln para abrir las arterias obstruidas (angioplastia).  Siente dolor en el pecho.  Le falta el aire. Para este estudio, se inyecta un tinte radiactivo Futures trader) en el torrente sanguneo. Despus de que el marcador se haya dirigido al corazn, se [place] un dispositivo de obtencin de imgenes para medir la cantidad de Geneticist, molecular que es absorbida por las diferentes zonas del corazn o que se distribuye en ellas. [place], este procedimiento se realiza en un hospital y [place] [facility] y Dentist. Informe al mdico acerca de lo siguiente:  Cualquier alergia que tenga.  Todos los [employer], incluidos vitaminas, hierbas, gotas oftlmicas, cremas y medicamentos de venta libre.  Cualquier problema previo que usted o algn miembro de su familia haya tenido con los anestsicos.  Cualquier trastorno de la sangre que tenga.  Cirugas a las que se haya sometido.  Cualquier afeccin mdica que tenga.  Si est embarazada o podra estarlo. Cules son los riesgos? En general, se trata de un procedimiento seguro. Sin embargo, pueden ocurrir complicaciones, por ejemplo:  Dolor intenso en el pecho e infarto de miocardio. Esto es solo un  riesgo si se realiza la parte de la prueba de esfuerzo del procedimiento.  Latidos cardacos rpidos.  Sensacin de Technical brewer. Esta suele desaparecer rpidamente.  Una reaccin alrgica al [employer]. Qu ocurre antes del procedimiento?  Consulte a su mdico si debe cambiar o suspender los medicamentos que toma habitualmente. Esto es muy importante si toma medicamentos para la diabetes o anticoagulantes.  Siga las indicaciones del mdico respecto de las restricciones en las comidas o las bebidas.  [place] del procedimiento. Qu ocurre durante el procedimiento?  Le colocarn una va intravenosa en una vena.  El mdico le inyectar una pequea cantidad de Geneticist, molecular radioactivo a travs de la va i.v.  Usted esperar durante 20 a 19minutos mientras el marcador viaja por el torrente sanguneo.  Se supervisar la actividad del corazn con un electrocardiograma (ECG).  Se recostar en una camilla.  Se tomarn imgenes del corazn durante un lapso de 15 a 2minutos.  Tambin pueden hacerle Art therapist. Para esta prueba, se podr hacer una de las siguientes opciones: ? Deber ejercitarse sobre una cinta caminadora o una bicicleta fija. Mientras hace ejercicio, se controlar la actividad del corazn con un ECG, y se le tomar la presin arterial. ? Le administrarn medicamentos que aumentarn el flujo de sangre a partes del corazn. Esto se realiza si no puede hacer ejercicio.  Cuando el corazn reciba el flujo mximo de [place], se volver a Environmental consultant a travs de la va i.v.  Despus de entre 20 y 26minutos, regresar a la  camilla y se tomarn ms imgenes del corazn.  Dependiendo del tipo de marcador [place], puede ser necesario repetir el procedimiento entre 3 y 4 horas ms tarde.  Cuando el procedimiento haya terminado, se retirar la va i.v. El procedimiento puede variar segn el mdico y el hospital. Sander Nephew ocurre despus del  procedimiento?  Puede retomar su rutina normal, incluidos la dieta, las actividades y [employer], a menos que el mdico le indique lo contrario.  A menos que su mdico le indique lo contrario, puede aumentar el consumo de lquidos. Esto ayudar a eliminar el tinte de contraste del cuerpo. Beba suficiente lquido como para Theatre manager la orina de color amarillo plido.  Consulte a su mdico o pregunte en el departamento donde se realiza el anlisis acerca de lo siguiente: ? Cundo estarn disponibles mis resultados? ? Cmo obtendr mis resultados? Resumen  Una gammagrafa cardaca mide el flujo de sangre al corazn cuando una persona est en reposo y cuando ejercita.  Infrmele al mdico si est embarazada.  Antes del procedimiento, hable con el mdico acerca de cambiar o suspender los medicamentos que toma habitualmente. Esto es muy importante si toma medicamentos para la diabetes o anticoagulantes.  Despus del procedimiento, a menos que su mdico le indique lo contrario, aumente el consumo de lquidos. Esto ayudar a eliminar el tinte de contraste del cuerpo.  Despus del procedimiento, puede retomar su rutina normal, incluidos la dieta, las actividades y [employer], a menos que el mdico le indique lo contrario. Esta informacin no tiene Marine scientist el consejo del mdico. Asegrese de hacerle al mdico cualquier pregunta que tenga. Document Revised: 08/03/2017 Document Reviewed: 08/03/2017 Elsevier Patient Education  [place].

## 2019-08-06 MED ORDER — HYDRALAZINE HCL 10 MG PO TABS: 10.0000 mg | ORAL_TABLET | Freq: Three times a day (TID) | ORAL | 1 refills | Status: AC

## 2019-08-17 ENCOUNTER — Encounter: Payer: Medicare Other | Attending: Family

## 2019-08-17 DIAGNOSIS — Z7189 Other specified counseling: Secondary | ICD-10-CM

## 2019-08-17 DIAGNOSIS — Z515 Encounter for palliative care: Secondary | ICD-10-CM

## 2019-10-14 ENCOUNTER — Ambulatory Visit: Payer: Medicare Other | Admitting: Family

## 2019-11-02 ENCOUNTER — Other Ambulatory Visit
Admission: RE | Admit: 2019-11-02 | Discharge: 2019-11-02 | Disposition: A | Discharge status: Home / Self Care | Payer: Medicare Other | Source: Ambulatory Visit | Attending: Vascular Surgery | Admitting: Vascular Surgery

## 2019-11-02 ENCOUNTER — Other Ambulatory Visit: Payer: Self-pay

## 2019-11-02 ENCOUNTER — Other Ambulatory Visit (INDEPENDENT_AMBULATORY_CARE_PROVIDER_SITE_OTHER): Payer: Self-pay | Admitting: Nurse Practitioner

## 2019-11-02 DIAGNOSIS — Z20822 Contact with and (suspected) exposure to covid-19: Secondary | ICD-10-CM | POA: Insufficient documentation

## 2019-11-02 DIAGNOSIS — Z01812 Encounter for preprocedural laboratory examination: Secondary | ICD-10-CM | POA: Diagnosis present

## 2019-11-02 LAB — SARS CORONAVIRUS 2 (TAT 6-24 HRS): SARS Coronavirus 2: NEGATIVE

## 2019-11-03 ENCOUNTER — Ambulatory Visit
Admission: RE | Admit: 2019-11-03 | Discharge: 2019-11-03 | Disposition: A | Discharge status: Home / Self Care | Payer: Medicare Other | Attending: Vascular Surgery | Admitting: Vascular Surgery

## 2019-11-03 ENCOUNTER — Encounter: Payer: Self-pay | Admitting: Vascular Surgery

## 2019-11-03 ENCOUNTER — Encounter
Admission: RE | Disposition: A | Discharge status: Home / Self Care | Payer: Self-pay | Source: Home / Self Care | Attending: Vascular Surgery

## 2019-11-03 DIAGNOSIS — E1122 Type 2 diabetes mellitus with diabetic chronic kidney disease: Secondary | ICD-10-CM | POA: Diagnosis not present

## 2019-11-03 DIAGNOSIS — Z4901 Encounter for fitting and adjustment of extracorporeal dialysis catheter: Secondary | ICD-10-CM | POA: Insufficient documentation

## 2019-11-03 DIAGNOSIS — I2510 Atherosclerotic heart disease of native coronary artery without angina pectoris: Secondary | ICD-10-CM | POA: Insufficient documentation

## 2019-11-03 DIAGNOSIS — N186 End stage renal disease: Secondary | ICD-10-CM | POA: Diagnosis not present

## 2019-11-03 DIAGNOSIS — I120 Hypertensive chronic kidney disease with stage 5 chronic kidney disease or end stage renal disease: Secondary | ICD-10-CM | POA: Diagnosis not present

## 2019-11-03 DIAGNOSIS — N185 Chronic kidney disease, stage 5: Secondary | ICD-10-CM | POA: Diagnosis not present

## 2019-11-03 HISTORY — PX: DIALYSIS/PERMA CATHETER INSERTION: CATH118288

## 2019-11-03 LAB — GLUCOSE, CAPILLARY
Glucose-Capillary: 76 mg/dL (ref 70–99)
Glucose-Capillary: 78 mg/dL (ref 70–99)

## 2019-11-03 LAB — POTASSIUM (ARMC VASCULAR LAB ONLY): Potassium (ARMC vascular lab): 3.8 (ref 3.5–5.1)

## 2019-11-03 SURGERY — DIALYSIS/PERMA CATHETER INSERTION
Anesthesia: Moderate Sedation

## 2019-11-03 MED ORDER — FAMOTIDINE 20 MG PO TABS: 40.0000 mg | ORAL_TABLET | Freq: Once | ORAL | Status: DC | PRN

## 2019-11-03 MED ORDER — FENTANYL CITRATE (PF) 100 MCG/2ML IJ SOLN
INTRAMUSCULAR | Status: AC
  Filled 2019-11-03: qty 2

## 2019-11-03 MED ORDER — HYDROMORPHONE HCL 1 MG/ML IJ SOLN: 1.0000 mg | Freq: Once | INTRAMUSCULAR | Status: DC | PRN

## 2019-11-03 MED ORDER — MIDAZOLAM HCL 2 MG/ML PO SYRP: 8.0000 mg | ORAL_SOLUTION | Freq: Once | ORAL | Status: DC | PRN

## 2019-11-03 MED ORDER — MIDAZOLAM HCL 2 MG/2ML IJ SOLN
INTRAMUSCULAR | Status: DC | PRN
  Administered 2019-11-03: 2 mg via INTRAVENOUS

## 2019-11-03 MED ORDER — CEFAZOLIN SODIUM-DEXTROSE 1-4 GM/50ML-% IV SOLN
1.0000 g | Freq: Once | INTRAVENOUS | Status: AC
  Administered 2019-11-03: 1 g via INTRAVENOUS

## 2019-11-03 MED ORDER — FENTANYL CITRATE (PF) 100 MCG/2ML IJ SOLN
INTRAMUSCULAR | Status: DC | PRN
  Administered 2019-11-03: 50 ug via INTRAVENOUS

## 2019-11-03 MED ORDER — METHYLPREDNISOLONE SODIUM SUCC 125 MG IJ SOLR: 125.0000 mg | Freq: Once | INTRAMUSCULAR | Status: DC | PRN

## 2019-11-03 MED ORDER — ONDANSETRON HCL 4 MG/2ML IJ SOLN: 4.0000 mg | Freq: Four times a day (QID) | INTRAMUSCULAR | Status: DC | PRN

## 2019-11-03 MED ORDER — MIDAZOLAM HCL 5 MG/5ML IJ SOLN
INTRAMUSCULAR | Status: AC
  Filled 2019-11-03: qty 5

## 2019-11-03 MED ORDER — DIPHENHYDRAMINE HCL 50 MG/ML IJ SOLN: 50.0000 mg | Freq: Once | INTRAMUSCULAR | Status: DC | PRN

## 2019-11-03 MED ORDER — SODIUM CHLORIDE 0.9 % IV SOLN: INTRAVENOUS | Status: DC

## 2019-11-03 SURGICAL SUPPLY — 7 items
BIOPATCH RED 1 DISK 7.0 (GAUZE/BANDAGES/DRESSINGS) ×2 IMPLANT
BIOPATCH RED 1IN DISK 7.0MM (GAUZE/BANDAGES/DRESSINGS) ×1
CATH PALINDROME-SP 14.5FX23 (CATHETERS) ×3 IMPLANT
GUIDEWIRE SUPER STIFF .035X180 (WIRE) ×3 IMPLANT
PACK ANGIOGRAPHY (CUSTOM PROCEDURE TRAY) ×3 IMPLANT
SUT MNCRL AB 4-0 PS2 18 (SUTURE) ×3 IMPLANT
SUT PROLENE 0 CT 1 30 (SUTURE) ×3 IMPLANT

## 2019-11-03 NOTE — Op Note (Signed)
OPERATIVE NOTE    PRE-OPERATIVE DIAGNOSIS: 1. ESRD 2. Non-functional permcath  POST-OPERATIVE DIAGNOSIS: same as above  PROCEDURE: 1. Fluoroscopic guidance for placement of catheter 2. Placement of a 23 cm tip to cuff tunneled hemodialysis catheter via the left internal jugular vein and removal of previous catheter  SURGEON: Leotis Pain, MD  ANESTHESIA:  Local with moderate conscious sedation for 25 minutes using 2 mg of Versed and 50 mcg of Fentanyl  ESTIMATED BLOOD LOSS: 3 cc  FINDING(S): none  SPECIMEN(S):  None  INDICATIONS:   Patient is a 79 y.o.female who presents with non-functional dialysis catheter and ESRD.  The patient needs long term dialysis access for their ESRD, and a Permcath is necessary.  Risks and benefits are discussed and informed consent is obtained.    DESCRIPTION: After obtaining full informed written consent, the patient was brought back to the vascular suite. The patient received moderate conscious sedation during a face-to-face encounter with me present throughout the entire procedure and supervising the RN monitoring the vital signs, pulse oximetry, telemetry, and mental status throughout the entire procedure. The patient's existing catheter, left neck and chest were sterilely prepped and draped in a sterile surgical field was created.  The existing catheter was dissected free from the fibrous sheath securing the cuff with hemostats and blunt dissection.  A wire was placed. The existing catheter was then removed and the wire used to keep venous access. I selected a 23 cm tip to cuff tunneled dialysis catheter.  Using fluoroscopic guidance the catheter tips were parked in the right atrium. The appropriate distal connectors were placed. It withdrew blood well and flushed easily with heparinized saline and a concentrated heparin solution was then placed. It was secured to the chest wall with 2 Prolene sutures. A 4-0 Monocryl pursestring suture was placed around the  exit site. Sterile dressings were placed. The patient tolerated the procedure well and was taken to the recovery room in stable condition.  COMPLICATIONS: None  CONDITION: Stable  Leotis Pain 11/03/2019 11:13 AM   This note was created with Dragon Medical transcription system. Any errors in dictation are purely unintentional.

## 2019-11-03 NOTE — H&P (Signed)
[facility] Admission History & Physical  MRN : [id number]  Robin Rogers is a 79 y.o. ([birth year]) female who presents with chief complaint of scheduled PermCath exchange.  History of Present Illness:  I am asked to evaluate the patient by the dialysis center. The patient was sent here because they were unable to achieve adequate dialysis yesterday. Furthermore the Center states they were unable to aspirate either lumen of the catheter yesterday.  This problem has been getting worse for about one week. The patient is unaware of any other change.   Patient denies pain or tenderness overlying the access.  There is no pain with dialysis.  Patient denies fevers or shaking chills while on dialysis.    The patient presents for a scheduled PermCath exchange.  Current Facility-Administered Medications  Medication Dose Route Frequency Provider Last Rate Last Admin   0.9 %  sodium chloride infusion   Intravenous Continuous Kris Hartmann, NP 75 mL/hr at 11/03/19 0955 New Bag at 11/03/19 0955   ceFAZolin (ANCEF) IVPB 1 g/50 mL premix  1 g Intravenous Once Kris Hartmann, NP       diphenhydrAMINE (BENADRYL) injection 50 mg  50 mg Intravenous Once PRN Kris Hartmann, NP       famotidine (PEPCID) tablet 40 mg  40 mg Oral Once PRN Kris Hartmann, NP       HYDROmorphone (DILAUDID) injection 1 mg  1 mg Intravenous Once PRN Kris Hartmann, NP       methylPREDNISolone sodium succinate (SOLU-MEDROL) 125 mg/2 mL injection 125 mg  125 mg Intravenous Once PRN Kris Hartmann, NP       midazolam (VERSED) 2 MG/ML syrup 8 mg  8 mg Oral Once PRN Kris Hartmann, NP       ondansetron [facility]) injection 4 mg  4 mg Intravenous Q6H PRN Kris Hartmann, NP       Past Surgical History:  Procedure Laterality Date   CHOLECYSTECTOMY     DIALYSIS/PERMA CATHETER INSERTION N/A 07/25/2019   Procedure: DIALYSIS/PERMA CATHETER INSERTION;  Surgeon: Algernon Huxley, MD;  Location: [place]  CV LAB;  Service: Cardiovascular;  Laterality: N/A;   ESOPHAGOGASTRODUODENOSCOPY N/A 09/24/2017   Procedure: ESOPHAGOGASTRODUODENOSCOPY (EGD);  Surgeon: Toledo, Benay Pike, MD;  Location: [facility] ENDOSCOPY;  Service: Gastroenterology;  Laterality: N/A;   Social History Social History   Tobacco Use   Smoking status: Never Smoker   Smokeless tobacco: Never Used  Scientific laboratory technician Use: Never used  Substance Use Topics   Alcohol use: No   Drug use: Never   Family History No family history on file.   No family history of bleeding or clotting disorders, autoimmune disease or porphyria  No Known Allergies  REVIEW OF SYSTEMS (Negative unless checked)  Constitutional: [] Weight loss  [] Fever  [] Chills Cardiac: [] Chest pain   [] Chest pressure   [] Palpitations   [] Shortness of breath when laying flat   [] Shortness of breath at rest   [x] Shortness of breath with exertion. Vascular:  [] Pain in legs with walking   [] Pain in legs at rest   [] Pain in legs when laying flat   [] Claudication   [] Pain in feet when walking  [] Pain in feet at rest  [] Pain in feet when laying flat   [] History of DVT   [] Phlebitis   [] Swelling in legs   [] Varicose veins   [] Non-healing ulcers Pulmonary:   [] Uses home oxygen   [] Productive cough   [] Hemoptysis   []   Wheeze  [] COPD   [] Asthma Neurologic:  [] Dizziness  [] Blackouts   [] Seizures   [] History of stroke   [] History of TIA  [] Aphasia   [] Temporary blindness   [] Dysphagia   [] Weakness or numbness in arms   [] Weakness or numbness in legs Musculoskeletal:  [] Arthritis   [] Joint swelling   [] Joint pain   [] Low back pain Hematologic:  [] Easy bruising  [] Easy bleeding   [] Hypercoagulable state   [] Anemic  [] Hepatitis Gastrointestinal:  [] Blood in stool   [] Vomiting blood  [] Gastroesophageal reflux/heartburn   [] Difficulty swallowing. Genitourinary:  [x] Chronic kidney disease   [] Difficult urination  [] Frequent urination  [] Burning with urination   [] Blood in urine Skin:   [] Rashes   [] Ulcers   [] Wounds Psychological:  [] History of anxiety   []  History of major depression.  Physical Examination  Vitals:   11/03/19 0934  BP: (!) 163/59  Pulse: 69  Resp: 18  Temp: 97.8 F (36.6 C)  TempSrc: Oral  SpO2: 97%  Weight: 63.5 kg  Height: 5\' 2"  (1.575 m)   Body mass index is 25.61 kg/m. Gen: WD/WN, NAD Head: [place]/AT, No temporalis wasting. Prominent temp pulse not noted. Ear/Nose/Throat: Hearing grossly intact, nares w/o erythema or drainage, oropharynx w/o Erythema/Exudate,  Eyes: Conjunctiva clear, sclera non-icteric Neck: Trachea midline.  No JVD.  Pulmonary:  Good air movement, respirations not labored, no use of accessory muscles.  Cardiac: RRR, normal S1, S2. Vascular:  Tunneled catheter without tenderness or drainage Vessel Right Left  Radial Palpable Palpable  Gastrointestinal: soft, non-tender/non-distended. No guarding/reflex.  Musculoskeletal: M/S 5/5 throughout.  Extremities without ischemic changes.  No deformity or atrophy.  Neurologic: Sensation grossly intact in extremities.  Symmetrical.  Speech is fluent. Motor exam as listed above. Psychiatric: Judgment intact, Mood & affect appropriate for pt's clinical situation. Dermatologic: No rashes or ulcers noted.  No cellulitis or open wounds. Lymph : No Cervical, Axillary, or Inguinal lymphadenopathy.  CBC Lab Results  Component Value Date   WBC 5.0 07/27/2019   HGB 8.3 (L) 07/27/2019   HCT 23.6 (L) 07/27/2019   MCV 92.5 07/27/2019   PLT 152 07/27/2019   BMET    Component Value Date/Time   NA 133 (L) 07/27/2019 0540   NA 140 05/19/2013 0757   K 3.8 07/27/2019 0540   K 5.2 (H) 05/19/2013 0757   CL 97 (L) 07/27/2019 0540   CL 111 (H) 05/19/2013 0757   CO2 29 07/27/2019 0540   CO2 22 05/19/2013 0757   GLUCOSE 84 07/27/2019 0540   GLUCOSE 109 (H) 05/19/2013 0757   BUN 17 07/27/2019 0540   BUN 35 (H) 05/19/2013 0757   CREATININE 2.80 (H) 07/27/2019 0540   CREATININE 1.57 (H)  05/19/2013 0757   CALCIUM 7.3 (L) 07/27/2019 0540   CALCIUM 7.8 (L) 05/15/2019 0456   GFRNONAA 16 (L) 07/27/2019 0540   GFRNONAA 33 (L) 05/19/2013 0757   GFRAA 18 (L) 07/27/2019 0540   GFRAA 38 (L) 05/19/2013 0757   CrCl cannot be calculated (Patient's most recent lab result is older than the maximum 21 days allowed.).  COAG No results found for: INR, PROTIME  Radiology No results found.  Assessment/Plan 1.  Complication dialysis device with thrombosis AV access:  Patient's right tunneled catheter is thrombosed. The patient will undergo exchange of the catheter same venous access using interventional techniques.  The risks and benefits were described to the patient.  All questions were answered.  The patient agrees to proceed with intervention.  2.  End-stage renal  disease requiring hemodialysis:  Patient will continue dialysis therapy without further interruption if a successful exchange is not achieved then new site will be found for tunneled catheter placement. Dialysis has already been arranged since the patient missed their previous session 3.  Hypertension:  Patient will continue medical management; nephrology is following no changes in oral medications. 4. Diabetes mellitus:  Glucose will be monitored and oral medications been held this morning once the patient has undergone the patient's procedure po intake will be reinitiated and again Accu-Cheks will be used to assess the blood glucose level and treat as needed. The patient will be restarted on the patient's usual hypoglycemic regime 5.  Coronary artery disease:  EKG will be monitored. Nitrates will be used if needed. The patient's oral cardiac medications will be continued.  Discussed with Dr. Mayme Genta, PA-C  11/03/2019 10:23 AM

## 2019-12-19 ENCOUNTER — Other Ambulatory Visit (INDEPENDENT_AMBULATORY_CARE_PROVIDER_SITE_OTHER): Payer: Self-pay | Admitting: Nurse Practitioner

## 2019-12-19 DIAGNOSIS — N186 End stage renal disease: Secondary | ICD-10-CM

## 2019-12-21 ENCOUNTER — Encounter (INDEPENDENT_AMBULATORY_CARE_PROVIDER_SITE_OTHER): Payer: Self-pay | Admitting: Nurse Practitioner

## 2019-12-21 ENCOUNTER — Encounter (INDEPENDENT_AMBULATORY_CARE_PROVIDER_SITE_OTHER): Payer: Medicare Other

## 2019-12-21 ENCOUNTER — Other Ambulatory Visit (INDEPENDENT_AMBULATORY_CARE_PROVIDER_SITE_OTHER): Payer: Medicare Other

## 2020-01-09 ENCOUNTER — Ambulatory Visit (INDEPENDENT_AMBULATORY_CARE_PROVIDER_SITE_OTHER): Payer: Medicare Other

## 2020-01-09 ENCOUNTER — Other Ambulatory Visit: Payer: Self-pay

## 2020-01-09 ENCOUNTER — Encounter (INDEPENDENT_AMBULATORY_CARE_PROVIDER_SITE_OTHER): Payer: Self-pay | Admitting: Nurse Practitioner

## 2020-01-09 DIAGNOSIS — N186 End stage renal disease: Secondary | ICD-10-CM

## 2020-01-11 ENCOUNTER — Telehealth (INDEPENDENT_AMBULATORY_CARE_PROVIDER_SITE_OTHER): Payer: Self-pay | Admitting: Nurse Practitioner

## 2020-01-11 NOTE — Telephone Encounter (Signed)
lvm with interpreter to callback to schedule follow up visit

## 2020-02-20 ENCOUNTER — Other Ambulatory Visit: Payer: Self-pay

## 2020-02-20 ENCOUNTER — Ambulatory Visit (INDEPENDENT_AMBULATORY_CARE_PROVIDER_SITE_OTHER): Payer: Medicare Other | Admitting: Vascular Surgery

## 2020-02-20 VITALS — BP 213/74 | HR 67 | Ht 64.0 in | Wt 143.0 lb

## 2020-02-20 DIAGNOSIS — E1122 Type 2 diabetes mellitus with diabetic chronic kidney disease: Secondary | ICD-10-CM | POA: Diagnosis not present

## 2020-02-20 DIAGNOSIS — N186 End stage renal disease: Secondary | ICD-10-CM

## 2020-02-20 DIAGNOSIS — I10 Essential (primary) hypertension: Secondary | ICD-10-CM

## 2020-02-20 DIAGNOSIS — I129 Hypertensive chronic kidney disease with stage 1 through stage 4 chronic kidney disease, or unspecified chronic kidney disease: Secondary | ICD-10-CM

## 2020-02-20 DIAGNOSIS — IMO0001 Reserved for inherently not codable concepts without codable children: Secondary | ICD-10-CM

## 2020-02-20 NOTE — Progress Notes (Signed)
MRN : [id number]  Robin Rogers is a 80 y.o. (06-25-1940) female who presents with chief complaint of  Chief Complaint  Patient presents with  . New Patient (Initial Visit)    Ref Chang vein mapping and consult  .  History of Present Illness:  The patient is seen for evaluation of dialysis access.  The patient has a history of multiple failed accesses.  There have not been any previous accesses.    Current access is via a left IJ catheter which is functioning well.  There have not been any episodes of catheter infection.  The patient denies amaurosis fugax or recent TIA symptoms. There are no recent neurological changes noted. The patient denies claudication symptoms or rest pain symptoms. The patient denies history of DVT, PE or superficial thrombophlebitis. The patient denies recent episodes of angina or shortness of breath.    Current Meds  Medication Sig  . amLODipine (NORVASC) 10 MG tablet Take by mouth.  Marland Kitchen aspirin EC 81 MG EC tablet Take 1 tablet (81 mg total) by mouth daily.  . carboxymethylcellulose (REFRESH PLUS) 0.5 % SOLN Apply to eye.  . carvedilol (COREG) 6.25 MG tablet Take 1 tablet (6.25 mg total) by mouth 2 (two) times daily with a meal.  . ferrous sulfate 325 (65 FE) MG tablet Take 325 mg by mouth 2 (two) times daily with a meal.  . fluticasone (FLONASE) 50 MCG/ACT nasal spray   . furosemide (LASIX) 40 MG tablet Take 1 tablet (40 mg total) by mouth daily.  . hydrALAZINE (APRESOLINE) 10 MG tablet Take 1 tablet (10 mg total) by mouth 3 (three) times daily.  Marland Kitchen lactulose (CHRONULAC) 10 GM/15ML solution TAKE 15MLS BY MOUTH THREE TIMES DAILY  . lovastatin (MEVACOR) 40 MG tablet Take 1 tablet by mouth at bedtime.   . melatonin 1 MG TABS tablet Take 2 mg by mouth at bedtime.  Marland Kitchen omeprazole (PRILOSEC) 40 MG capsule Take 40 mg by mouth daily.  . ondansetron (ZOFRAN-ODT) 4 MG disintegrating tablet Take 4 mg by mouth every 8 (eight) hours as needed.  . polyethylene glycol  powder (GLYCOLAX/MIRALAX) 17 GM/SCOOP powder Take by mouth.  . sevelamer carbonate (RENVELA) 800 MG tablet Take 800 mg by mouth 3 (three) times daily with meals.  . sodium bicarbonate 650 MG tablet Take 650 mg by mouth 2 (two) times daily.  . traZODone (DESYREL) 50 MG tablet Take 50 mg by mouth at bedtime.  . Vitamin D, Ergocalciferol, (DRISDOL) 1.25 MG (50000 UNIT) CAPS capsule Take 50,000 Units by mouth every 7 (seven) days. Cyril Loosen)    Past Medical History:  Diagnosis Date  . Anemia of chronic disease   . Blindness   . Chronic combined systolic (congestive) and diastolic (congestive) heart failure ([place])    a. 09/2017 Echo: EF 45-50%, Gr2 DD; b. 05/2019 Echo: EF 40-45%, Gr III DD (restrictive), nl RV fxn, mod elev PASP. Mod dil LA. Mild MR.  . CKD (chronic kidney disease), stage IV ([place])   . Diet-controlled diabetes mellitus ([place])   . Hypertension     Past Surgical History:  Procedure Laterality Date  . CHOLECYSTECTOMY    . DIALYSIS/PERMA CATHETER INSERTION N/A 07/25/2019   Procedure: DIALYSIS/PERMA CATHETER INSERTION;  Surgeon: Algernon Huxley, MD;  Location: [facility];  Service: Cardiovascular;  Laterality: N/A;  . DIALYSIS/PERMA CATHETER INSERTION N/A 11/03/2019   Procedure: DIALYSIS/PERMA CATHETER INSERTION;  Surgeon: Algernon Huxley, MD;  Location: [facility];  Service: Cardiovascular;  Laterality: N/A;  . ESOPHAGOGASTRODUODENOSCOPY N/A 09/24/2017   Procedure: ESOPHAGOGASTRODUODENOSCOPY (EGD);  Surgeon: Toledo, Benay Pike, MD;  Location: [facility];  Service: Gastroenterology;  Laterality: N/A;    Social History Social History   Tobacco Use  . Smoking status: Never Smoker  . Smokeless tobacco: Never Used  Vaping Use  . Vaping Use: Never used  Substance Use Topics  . Alcohol use: No  . Drug use: Never    Family History No family history of bleeding/clotting disorders, porphyria or autoimmune disease   No Known Allergies   REVIEW OF SYSTEMS  (Negative unless checked)  Constitutional: '[]'$ Weight loss  '[]'$ Fever  '[]'$ Chills Cardiac: '[]'$ Chest pain   '[]'$ Chest pressure   '[]'$ Palpitations   '[]'$ Shortness of breath when laying flat   '[]'$ Shortness of breath with exertion. Vascular:  '[]'$ Pain in legs with walking   '[]'$ Pain in legs at rest  '[]'$ History of DVT   '[]'$ Phlebitis   '[]'$ Swelling in legs   '[]'$ Varicose veins   '[]'$ Non-healing ulcers Pulmonary:   '[]'$ Uses home oxygen   '[]'$ Productive cough   '[]'$ Hemoptysis   '[]'$ Wheeze  '[]'$ COPD   '[]'$ Asthma Neurologic:  '[]'$ Dizziness   '[]'$ Seizures   '[]'$ History of stroke   '[]'$ History of TIA  '[]'$ Aphasia   '[]'$ Vissual changes   '[]'$ Weakness or numbness in arm   '[]'$ Weakness or numbness in leg Musculoskeletal:   '[]'$ Joint swelling   '[]'$ Joint pain   '[]'$ Low back pain Hematologic:  '[]'$ Easy bruising  '[]'$ Easy bleeding   '[]'$ Hypercoagulable state   '[]'$ Anemic Gastrointestinal:  '[]'$ Diarrhea   '[]'$ Vomiting  '[]'$ Gastroesophageal reflux/heartburn   '[]'$ Difficulty swallowing. Genitourinary:  '[x]'$ Chronic kidney disease   '[]'$ Difficult urination  '[]'$ Frequent urination   '[]'$ Blood in urine Skin:  '[]'$ Rashes   '[]'$ Ulcers  Psychological:  '[]'$ History of anxiety   '[]'$  History of major depression.  Physical Examination  Vitals:   02/20/20 1328  BP: (!) 213/74  Pulse: 67  Weight: 143 lb (64.9 kg)  Height: '5\' 4"'$  (1.626 m)   Body mass index is 24.55 kg/m. Gen: WD/WN, NAD Head: [place]/AT, No temporalis wasting.  Ear/Nose/Throat: Hearing grossly intact, nares w/o erythema or drainage, poor dentition Eyes: PER, EOMI, sclera nonicteric.  Neck: Supple, no masses.  No bruit or JVD.  Pulmonary:  Good air movement, clear to auscultation bilaterally, no use of accessory muscles.  Cardiac: RRR, normal S1, S2, no Murmurs. Vascular: palpable cephalic vein in the antecubital fosa Vessel Right Left  Radial Palpable Palpable  Ulnar Palpable Palpable  Brachial Palpable Palpable  Gastrointestinal: soft, non-distended. No guarding/no peritoneal signs.  Musculoskeletal: M/S 5/5 throughout.  No deformity or  atrophy.  Neurologic: CN 2-12 intact. Pain and light touch intact in extremities.  Symmetrical.  Speech is fluent. Motor exam as listed above. Psychiatric: Judgment intact, Mood & affect appropriate for pt's clinical situation. Dermatologic: No rashes or ulcers noted.  No changes consistent with cellulitis.  CBC Lab Results  Component Value Date   WBC 5.0 07/27/2019   HGB 8.3 (L) 07/27/2019   HCT 23.6 (L) 07/27/2019   MCV 92.5 07/27/2019   PLT 152 07/27/2019    BMET    Component Value Date/Time   NA 133 (L) 07/27/2019 0540   NA 140 05/19/2013 0757   K 3.8 07/27/2019 0540   K 5.2 (H) 05/19/2013 0757   CL 97 (L) 07/27/2019 0540   CL 111 (H) 05/19/2013 0757   CO2 29 07/27/2019 0540   CO2 22 05/19/2013 0757   GLUCOSE 84 07/27/2019 0540   GLUCOSE 109 (H) 05/19/2013 0757   BUN 17  07/27/2019 0540   BUN 35 (H) 05/19/2013 0757   CREATININE 2.80 (H) 07/27/2019 0540   CREATININE 1.57 (H) 05/19/2013 0757   CALCIUM 7.3 (L) 07/27/2019 0540   CALCIUM 7.8 (L) 05/15/2019 0456   GFRNONAA 16 (L) 07/27/2019 0540   GFRNONAA 33 (L) 05/19/2013 0757   GFRAA 18 (L) 07/27/2019 0540   GFRAA 38 (L) 05/19/2013 0757   CrCl cannot be calculated (Patient's most recent lab result is older than the maximum 21 days allowed.).  COAG No results found for: INR, PROTIME  Radiology No results found.   Assessment/Plan 1. ESRD (end stage renal disease) ([place]) Recommend:  At this time the patient does not have appropriate extremity access for dialysis  Patient should have a left brachial cephalic fistula created (I have also discussed an AV graft if the vein is not adequate).  The risks, benefits and alternative therapies were reviewed in detail with the patient.  All questions were answered.  The patient agrees to proceed with surgery.    2. Primary hypertension Continue antihypertensive medications as already ordered, these medications have been reviewed and there are no changes at this  time.   3. 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  Hgb A1C to be monitored as already arranged by primary service     [name], MD  02/20/2020 1:31 PM

## 2020-02-26 ENCOUNTER — Encounter (INDEPENDENT_AMBULATORY_CARE_PROVIDER_SITE_OTHER): Payer: Self-pay | Admitting: Vascular Surgery

## 2020-03-09 ENCOUNTER — Telehealth (INDEPENDENT_AMBULATORY_CARE_PROVIDER_SITE_OTHER): Payer: Self-pay

## 2020-03-09 NOTE — Telephone Encounter (Signed)
Using [employer] I attempted to contact the patient to schedule the left brachial cephalic AV fistula and a message was left to return the call with an interpreter.

## 2020-03-13 NOTE — Telephone Encounter (Signed)
The patient's daughter [name] contacted our office to schedule the left brachial cephalic AV fistula with Dr. Delana Meyer at the [facility]. Patient is scheduled on 04/[facility]/20 at the [facility], pre-op phone call on 03/30/20 between 8-1 pm and covid testing on 04/04/20 between 8-1 pm at the [facility]. Pre-surgical instructions will be mailed to the patient as requested by the daughter.

## 2020-03-28 ENCOUNTER — Other Ambulatory Visit (INDEPENDENT_AMBULATORY_CARE_PROVIDER_SITE_OTHER): Payer: Self-pay | Admitting: Nurse Practitioner

## 2020-03-30 ENCOUNTER — Other Ambulatory Visit
Admission: RE | Admit: 2020-03-30 | Discharge: 2020-03-30 | Disposition: A | Discharge status: Home / Self Care | Payer: Medicare Other | Source: Ambulatory Visit | Attending: Vascular Surgery | Admitting: Vascular Surgery

## 2020-03-30 NOTE — Pre-Procedure Instructions (Signed)
Attempted to contact patient using [facility] interpreter service (interpreter [phone]). Message left to expect call again on Monday.

## 2020-04-02 ENCOUNTER — Inpatient Hospital Stay: Admission: RE | Admit: 2020-04-02 | Payer: Medicare Other | Source: Ambulatory Visit

## 2020-04-02 NOTE — Pre-Procedure Instructions (Signed)
Phone call made to patient by interpreter Herb Grays. Message left for patient to come for appt [facility] 2020 - to see nurse, have labs drawn and get covid test. Call back number [phone] given.

## 2020-04-04 ENCOUNTER — Other Ambulatory Visit
Admission: RE | Admit: 2020-04-04 | Discharge: 2020-04-04 | Disposition: A | Discharge status: Home / Self Care | Payer: Medicare Other | Source: Ambulatory Visit | Attending: Vascular Surgery | Admitting: Vascular Surgery

## 2020-04-04 ENCOUNTER — Other Ambulatory Visit: Payer: Self-pay

## 2020-04-04 DIAGNOSIS — Z20822 Contact with and (suspected) exposure to covid-19: Secondary | ICD-10-CM | POA: Diagnosis not present

## 2020-04-04 DIAGNOSIS — Z01818 Encounter for other preprocedural examination: Secondary | ICD-10-CM | POA: Diagnosis present

## 2020-04-04 LAB — CBC WITH DIFFERENTIAL/PLATELET
Abs Immature Granulocytes: 0.01 10*3/uL (ref 0.00–0.07)
Basophils Absolute: 0.1 10*3/uL (ref 0.0–0.1)
Basophils Relative: 1 %
Eosinophils Absolute: 0.2 10*3/uL (ref 0.0–0.5)
Eosinophils Relative: 3 %
HCT: 42.5 % (ref 36.0–46.0)
Hemoglobin: 14 g/dL (ref 12.0–15.0)
Immature Granulocytes: 0 %
Lymphocytes Relative: 34 %
Lymphs Abs: 2 10*3/uL (ref 0.7–4.0)
MCH: 33.3 pg (ref 26.0–34.0)
MCHC: 32.9 g/dL (ref 30.0–36.0)
MCV: 101 fL — ABNORMAL HIGH (ref 80.0–100.0)
Monocytes Absolute: 0.6 10*3/uL (ref 0.1–1.0)
Monocytes Relative: 10 %
Neutro Abs: 3 10*3/uL (ref 1.7–7.7)
Neutrophils Relative %: 52 %
Platelets: 234 10*3/uL (ref 150–400)
RBC: 4.21 MIL/uL (ref 3.87–5.11)
RDW: 14 % (ref 11.5–15.5)
WBC: 5.8 10*3/uL (ref 4.0–10.5)
nRBC: 0 % (ref 0.0–0.2)

## 2020-04-04 LAB — TYPE AND SCREEN
ABO/RH(D): O POS
Antibody Screen: NEGATIVE

## 2020-04-04 LAB — BASIC METABOLIC PANEL
Anion gap: 13 (ref 5–15)
BUN: 49 mg/dL — ABNORMAL HIGH (ref 8–23)
CO2: 22 mmol/L (ref 22–32)
Calcium: 8.1 mg/dL — ABNORMAL LOW (ref 8.9–10.3)
Chloride: 100 mmol/L (ref 98–111)
Creatinine, Ser: 5.74 mg/dL — ABNORMAL HIGH (ref 0.44–1.00)
GFR, Estimated: 7 mL/min — ABNORMAL LOW (ref >60)
Glucose, Bld: 169 mg/dL — ABNORMAL HIGH (ref 70–99)
Potassium: 4.9 mmol/L (ref 3.5–5.1)
Sodium: 135 mmol/L (ref 135–145)

## 2020-04-04 LAB — SARS CORONAVIRUS 2 (TAT 6-24 HRS): SARS Coronavirus 2: NEGATIVE

## 2020-04-04 LAB — APTT: aPTT: 32 seconds (ref 24–36)

## 2020-04-04 LAB — PROTIME-INR
INR: 1.1 (ref 0.8–1.2)
Prothrombin Time: 13.5 seconds (ref 11.4–15.2)

## 2020-04-04 NOTE — Patient Instructions (Addendum)
Your procedure is scheduled on: Friday April 06, 2020. Su procedimiento est programado para: Viernes 1ro de Abril del 2022. Report to Day Surgery inside [facility] 2nd floor (stop by admissions desk first before getting on elevator) Presntese a: [facility] 2nd floor (registrese primero antes de subir al elevador) To find out your arrival time please call [phone] between 1PM - 3PM on Thursday April 05, 2020. Para saber su hora de llegada por favor llame al [phone] Lyndal Pulley la 1PM - 3PM el da: Rudi Coco [place] 2022.  Remember: Instructions that are not followed completely may result in serious medical risk, up to and including death,  or upon the discretion of your surgeon and anesthesiologist your surgery may need to be rescheduled.  Recuerde: Las instrucciones que no se siguen completamente Heritage manager en un riesgo de salud grave, incluyendo hasta  la [place] o a discrecin de su cirujano y Environmental health practitioner, su ciruga se puede posponer.   __X_ 1.Do not eat food after midnight the night before your procedure. No    gum chewing or hard candies. You may drink clear liquids up to 2 hours     before you are scheduled to arrive for your surgery- DO not drink clear     Liquids within 2 hours of the start of your surgery.     Clear Liquids include:    water, apple juice without pulp, clear carbohydrate drink such as    Clearfast of [place], Black Coffee or Tea (Do not add anything to coffee or tea).      No coma nada despus de la medianoche de la noche anterior a su    procedimiento. No coma chicles ni caramelos duros. Puede tomar    lquidos claros hasta 2 horas antes de su hora programada de llegada al     hospital para su procedimiento. No tome lquidos claros durante el     transcurso de las 2 horas de su llegada programada al hospital para su     procedimiento, ya que esto puede llevar a que su procedimiento se    retrase o tenga que  volver a Health and safety inspector.  Los lquidos claros incluyen:          - Agua o jugo de [place] sin pulpa          - Bebidas claras con carbohidratos como ClearFast o Gatorade          - Caf negro o t claro (sin leche, sin cremas, no agregue nada al caf ni al t)  No tome nada que no est en esta lista.  Los pacientes con diabetes tipo 1 y tipo 2 solo deben Agricultural engineer.  Llame a la clnica de [facility] o a la unidad de Same Day Surgery si  tiene alguna pregunta sobre estas instrucciones.              _X__ 2.Do Not Smoke or use e-cigarettes For 24 Hours Prior to Your Surgery.    Do not use any chewable tobacco products for at least 6   hours prior to surgery.    No fume ni use cigarrillos electrnicos durante las 24 horas previas    a su [place].  No use ningn producto de tabaco masticable durante   al menos 6 horas antes de la [place].     __X_ 3. No alcohol for 24 hours before or after surgery.    No tome alcohol durante las 24 horas antes ni  despus de la ciruga.   ____4. On the morning of surgery brush your teeth with toothpaste and water, you                may rinse your mouth with mouthwash if you wish.  Do not swallow any toothpaste of mouthwash.   En la maana de la [place], cepllese los dientes con pasta de dientes y [place],                [place] enjuagarse la boca con enjuague bucal si lo desea. No ingiera ninguna pasta de dientes o enjuague bucal.   ____ 5. Notify your doctor if there is any change in your medical condition (cold,fever, infections).    Informe a su mdico si hay algn cambio en su condicin mdica  (resfriado, fiebre, infecciones).   Do not wear jewelry, make-up, hairpins, clips or nail polish.  No use joyas, maquillajes, pinzas/ganchos para el cabello ni esmalte de uas.  Do not wear lotions, powders, or perfumes. You may wear deodorant.  No use lociones, polvos o perfumes.  Puede usar desodorante.    Do not shave 48 hours prior to surgery. Men may shave face  and neck.  No se afeite 48 horas antes de la [place].  Los hombres pueden [employer] cara  y el cuello.   Do not bring valuables to the hospital.   No lleve objetos [facility] is not responsible for any belongings or valuables.  [place] no se hace responsable de ningn tipo de pertenencias u objetos de Geographical information systems officer.               Contacts, dentures or bridgework may not be worn into surgery.  Los lentes de [place], las dentaduras postizas o puentes no se pueden usar en la [place].   Leave your suitcase in the car. After surgery it may be brought to your room.  Deje su maleta en el auto.  Despus de la ciruga podr traerla a su habitacin.   For patients admitted to the hospital, discharge time is determined by your  treatment team.  Para los pacientes que sean ingresados al hospital, el tiempo en el cual se le  dar de alta es determinado por su equipo de [place].   Patients discharged the day of surgery will not be allowed to drive home. A los pacientes que se les da de alta el mismo da de la ciruga no se les permitir conducir a [place] representative.   Please read over the following fact sheets that you were given: Por favor [place] informacin que le dieron:      __X__ Take these medicines the morning of surgery with A SIP OF WATER:          [employer] medicinas la maana de la ciruga con UN SORBO DE AGUA:    1. carvedilol (COREG) 6.25 MG   2. metoprolol tartrate (LOPRESSOR) 25 MG  3. omeprazole (PRILOSEC) 40 MG      3. sevelamer carbonate (RENVELA) 800 MG  5. hydrALAZINE (APRESOLINE) 10 MG   ____ Fleet Enema (as directed)          Enema de [place] (segn lo indicado)    __X__ Use CHG Soap as directed          Utilice el jabn de CHG segn lo indicado  ____ Use inhalers on the day of surgery          Use los inhaladores  el da de la ciruga  ____ Stop metformin 2 days prior to surgery          Deje de tomar el metformin 2 das antes de  la ciruga    ____ Take 1/2 of usual insulin dose the night before surgery and none on the morning of surgery           Tome la mitad de la dosis habitual de insulina la noche antes de la [place] y no tome nada en la maana de la             ciruga  ____ Stop Coumadin/Plavix/aspirin           Deje de tomar el Coumadin/Plavix/aspirina el da:  __X__ Stop Anti-inflammatories such as Ibuprofen, Aleve, Advil, naproxen, and or BC powders.           Deje de tomar antiinflamatorios como  Ibuprofen, Aleve, Advil, naproxen, y polvos de BC powders.    __X__ Stop supplements until after surgery            Deje de tomar suplementos hasta despus de la ciruga  __X__ Do not start any herbal supplements before your procedure.          No empiece a tomar suplementos de hierbas antes de su cirugia.

## 2020-04-05 MED ORDER — CHLORHEXIDINE GLUCONATE CLOTH 2 % EX PADS
6.0000 | MEDICATED_PAD | Freq: Once | CUTANEOUS | Status: AC
  Administered 2020-04-06: 6 via TOPICAL

## 2020-04-05 MED ORDER — SODIUM CHLORIDE 0.9 % IV SOLN: INTRAVENOUS | Status: DC

## 2020-04-05 MED ORDER — CEFAZOLIN SODIUM-DEXTROSE 1-4 GM/50ML-% IV SOLN
1.0000 g | INTRAVENOUS | Status: AC
  Administered 2020-04-06: 2 g via INTRAVENOUS

## 2020-04-05 MED ORDER — ORAL CARE MOUTH RINSE: 15.0000 mL | Freq: Once | OROMUCOSAL | Status: AC

## 2020-04-05 MED ORDER — CHLORHEXIDINE GLUCONATE 0.12 % MT SOLN
15.0000 mL | Freq: Once | OROMUCOSAL | Status: AC
  Administered 2020-04-06: 15 mL via OROMUCOSAL

## 2020-04-06 ENCOUNTER — Ambulatory Visit
Admission: RE | Admit: 2020-04-06 | Discharge: 2020-04-06 | Disposition: A | Discharge status: Home / Self Care | Payer: Medicare Other | Source: Ambulatory Visit | Attending: Vascular Surgery | Admitting: Vascular Surgery

## 2020-04-06 ENCOUNTER — Encounter: Payer: Self-pay | Admitting: Vascular Surgery

## 2020-04-06 ENCOUNTER — Other Ambulatory Visit: Payer: Self-pay

## 2020-04-06 ENCOUNTER — Encounter
Admission: RE | Disposition: A | Discharge status: Home / Self Care | Payer: Self-pay | Source: Ambulatory Visit | Attending: Vascular Surgery

## 2020-04-06 ENCOUNTER — Ambulatory Visit: Payer: Medicare Other | Admitting: Urgent Care

## 2020-04-06 DIAGNOSIS — I132 Hypertensive heart and chronic kidney disease with heart failure and with stage 5 chronic kidney disease, or end stage renal disease: Secondary | ICD-10-CM | POA: Insufficient documentation

## 2020-04-06 DIAGNOSIS — E1122 Type 2 diabetes mellitus with diabetic chronic kidney disease: Secondary | ICD-10-CM | POA: Insufficient documentation

## 2020-04-06 DIAGNOSIS — N185 Chronic kidney disease, stage 5: Secondary | ICD-10-CM | POA: Diagnosis not present

## 2020-04-06 DIAGNOSIS — N186 End stage renal disease: Secondary | ICD-10-CM | POA: Diagnosis not present

## 2020-04-06 DIAGNOSIS — Z7982 Long term (current) use of aspirin: Secondary | ICD-10-CM | POA: Diagnosis not present

## 2020-04-06 DIAGNOSIS — Z79899 Other long term (current) drug therapy: Secondary | ICD-10-CM | POA: Diagnosis not present

## 2020-04-06 DIAGNOSIS — I5042 Chronic combined systolic (congestive) and diastolic (congestive) heart failure: Secondary | ICD-10-CM | POA: Diagnosis not present

## 2020-04-06 HISTORY — PX: AV FISTULA PLACEMENT: SHX1204

## 2020-04-06 LAB — POCT I-STAT, CHEM 8
BUN: 45 mg/dL — ABNORMAL HIGH (ref 8–23)
Calcium, Ion: 1.1 mmol/L — ABNORMAL LOW (ref 1.15–1.40)
Chloride: 105 mmol/L (ref 98–111)
Creatinine, Ser: 3.8 mg/dL — ABNORMAL HIGH (ref 0.44–1.00)
Glucose, Bld: 95 mg/dL (ref 70–99)
HCT: 45 % (ref 36.0–46.0)
Hemoglobin: 15.3 g/dL — ABNORMAL HIGH (ref 12.0–15.0)
Potassium: 5.3 mmol/L — ABNORMAL HIGH (ref 3.5–5.1)
Sodium: 140 mmol/L (ref 135–145)
TCO2: 24 mmol/L (ref 22–32)

## 2020-04-06 LAB — GLUCOSE, CAPILLARY: Glucose-Capillary: 102 mg/dL — ABNORMAL HIGH (ref 70–99)

## 2020-04-06 SURGERY — ARTERIOVENOUS (AV) FISTULA CREATION
Anesthesia: General | Laterality: Left

## 2020-04-06 MED ORDER — HYDROCODONE-ACETAMINOPHEN 5-325 MG PO TABS: 1.0000 | ORAL_TABLET | Freq: Four times a day (QID) | ORAL | 0 refills | Status: DC | PRN

## 2020-04-06 MED ORDER — ONDANSETRON HCL 4 MG/2ML IJ SOLN
INTRAMUSCULAR | Status: DC | PRN
  Administered 2020-04-06: 4 mg via INTRAVENOUS

## 2020-04-06 MED ORDER — BACITRACIN ZINC 500 UNIT/GM EX OINT: TOPICAL_OINTMENT | CUTANEOUS | 0 refills | Status: DC

## 2020-04-06 MED ORDER — OXYCODONE HCL 5 MG PO TABS: 5.0000 mg | ORAL_TABLET | Freq: Once | ORAL | Status: DC | PRN

## 2020-04-06 MED ORDER — PROPOFOL 10 MG/ML IV BOLUS
INTRAVENOUS | Status: DC | PRN
  Administered 2020-04-06: 100 mg via INTRAVENOUS

## 2020-04-06 MED ORDER — BUPIVACAINE HCL (PF) 0.5 % IJ SOLN
INTRAMUSCULAR | Status: AC
  Filled 2020-04-06: qty 30

## 2020-04-06 MED ORDER — CHLORHEXIDINE GLUCONATE 0.12 % MT SOLN
OROMUCOSAL | Status: AC
  Filled 2020-04-06: qty 15

## 2020-04-06 MED ORDER — CEFAZOLIN SODIUM-DEXTROSE 2-4 GM/100ML-% IV SOLN
INTRAVENOUS | Status: AC
  Filled 2020-04-06: qty 100

## 2020-04-06 MED ORDER — LIDOCAINE HCL (PF) 2 % IJ SOLN
INTRAMUSCULAR | Status: AC
  Filled 2020-04-06: qty 5

## 2020-04-06 MED ORDER — FENTANYL CITRATE (PF) 100 MCG/2ML IJ SOLN
INTRAMUSCULAR | Status: AC
  Administered 2020-04-06: 35 ug via INTRAVENOUS
  Filled 2020-04-06: qty 2

## 2020-04-06 MED ORDER — EPHEDRINE 5 MG/ML INJ
INTRAVENOUS | Status: AC
  Filled 2020-04-06: qty 10

## 2020-04-06 MED ORDER — FENTANYL CITRATE (PF) 100 MCG/2ML IJ SOLN
INTRAMUSCULAR | Status: DC | PRN
  Administered 2020-04-06 (×2): 50 ug via INTRAVENOUS

## 2020-04-06 MED ORDER — SUGAMMADEX SODIUM 200 MG/2ML IV SOLN
INTRAVENOUS | Status: DC | PRN
  Administered 2020-04-06 (×2): 100 mg via INTRAVENOUS

## 2020-04-06 MED ORDER — ONDANSETRON HCL 4 MG/2ML IJ SOLN: 4.0000 mg | Freq: Four times a day (QID) | INTRAMUSCULAR | Status: DC | PRN

## 2020-04-06 MED ORDER — FENTANYL CITRATE (PF) 100 MCG/2ML IJ SOLN
25.0000 ug | INTRAMUSCULAR | Status: DC | PRN
  Administered 2020-04-06: 25 ug via INTRAVENOUS
  Administered 2020-04-06: 50 ug via INTRAVENOUS

## 2020-04-06 MED ORDER — HEPARIN SODIUM (PORCINE) 5000 UNIT/ML IJ SOLN
INTRAMUSCULAR | Status: AC
  Filled 2020-04-06: qty 1

## 2020-04-06 MED ORDER — ROCURONIUM BROMIDE 100 MG/10ML IV SOLN
INTRAVENOUS | Status: DC | PRN
  Administered 2020-04-06: 40 mg via INTRAVENOUS

## 2020-04-06 MED ORDER — ONDANSETRON HCL 4 MG/2ML IJ SOLN
INTRAMUSCULAR | Status: AC
  Filled 2020-04-06: qty 2

## 2020-04-06 MED ORDER — DEXAMETHASONE SODIUM PHOSPHATE 10 MG/ML IJ SOLN
INTRAMUSCULAR | Status: AC
  Filled 2020-04-06: qty 1

## 2020-04-06 MED ORDER — OXYCODONE HCL 5 MG/5ML PO SOLN
5.0000 mg | Freq: Once | ORAL | Status: DC | PRN
Start: 2020-04-06 — End: 2020-04-06

## 2020-04-06 MED ORDER — ROCURONIUM BROMIDE 10 MG/ML (PF) SYRINGE
PREFILLED_SYRINGE | INTRAVENOUS | Status: AC
  Filled 2020-04-06: qty 10

## 2020-04-06 MED ORDER — BACITRACIN ZINC 500 UNIT/GM EX OINT
TOPICAL_OINTMENT | CUTANEOUS | Status: AC
  Filled 2020-04-06: qty 28.35

## 2020-04-06 MED ORDER — PROPOFOL 10 MG/ML IV BOLUS
INTRAVENOUS | Status: AC
  Filled 2020-04-06: qty 20

## 2020-04-06 MED ORDER — HYDROMORPHONE HCL 1 MG/ML IJ SOLN: 1.0000 mg | Freq: Once | INTRAMUSCULAR | Status: DC | PRN

## 2020-04-06 MED ORDER — SUCCINYLCHOLINE CHLORIDE 200 MG/10ML IV SOSY
PREFILLED_SYRINGE | INTRAVENOUS | Status: AC
  Filled 2020-04-06: qty 10

## 2020-04-06 MED ORDER — SODIUM CHLORIDE 0.9 % IV SOLN
INTRAVENOUS | Status: DC | PRN
  Administered 2020-04-06: 25 mL via INTRAMUSCULAR

## 2020-04-06 MED ORDER — BUPIVACAINE LIPOSOME 1.3 % IJ SUSP
INTRAMUSCULAR | Status: DC | PRN
  Administered 2020-04-06: 13 mL

## 2020-04-06 MED ORDER — EPHEDRINE SULFATE 50 MG/ML IJ SOLN
INTRAMUSCULAR | Status: DC | PRN
  Administered 2020-04-06: 5 mg via INTRAVENOUS

## 2020-04-06 MED ORDER — LIDOCAINE HCL (CARDIAC) PF 100 MG/5ML IV SOSY
PREFILLED_SYRINGE | INTRAVENOUS | Status: DC | PRN
  Administered 2020-04-06: 80 mg via INTRAVENOUS

## 2020-04-06 MED ORDER — BUPIVACAINE LIPOSOME 1.3 % IJ SUSP
INTRAMUSCULAR | Status: AC
  Filled 2020-04-06: qty 20

## 2020-04-06 MED ORDER — DEXAMETHASONE SODIUM PHOSPHATE 10 MG/ML IJ SOLN
INTRAMUSCULAR | Status: DC | PRN
  Administered 2020-04-06: 5 mg via INTRAVENOUS

## 2020-04-06 MED ORDER — FENTANYL CITRATE (PF) 100 MCG/2ML IJ SOLN
INTRAMUSCULAR | Status: AC
  Filled 2020-04-06: qty 2

## 2020-04-06 SURGICAL SUPPLY — 62 items
ADH SKN CLS APL DERMABOND .7 (GAUZE/BANDAGES/DRESSINGS) ×1
APL PRP STRL LF DISP 70% ISPRP (MISCELLANEOUS) ×1
APPLIER CLIP 11 MED OPEN (CLIP)
APPLIER CLIP 9.375 SM OPEN (CLIP)
APR CLP MED 11 20 MLT OPN (CLIP)
APR CLP SM 9.3 20 MLT OPN (CLIP)
BAG COUNTER SPONGE EZ (MISCELLANEOUS) ×2 IMPLANT
BAG DECANTER FOR FLEXI CONT (MISCELLANEOUS) ×2 IMPLANT
BAG SPNG 4X4 CLR HAZ (MISCELLANEOUS) ×1
BLADE SURG SZ11 CARB STEEL (BLADE) ×2 IMPLANT
BOOT SUTURE AID YELLOW STND (SUTURE) ×2 IMPLANT
BRUSH SCRUB EZ  4% CHG (MISCELLANEOUS) ×1
BRUSH SCRUB EZ 4% CHG (MISCELLANEOUS) ×1 IMPLANT
CANISTER SUCT 1200ML W/VALVE (MISCELLANEOUS) IMPLANT
CHLORAPREP W/TINT 26 (MISCELLANEOUS) ×2 IMPLANT
CLIP APPLIE 11 MED OPEN (CLIP) IMPLANT
CLIP APPLIE 9.375 SM OPEN (CLIP) IMPLANT
COVER WAND RF STERILE (DRAPES) ×2 IMPLANT
DERMABOND ADVANCED (GAUZE/BANDAGES/DRESSINGS) ×1
DERMABOND ADVANCED .7 DNX12 (GAUZE/BANDAGES/DRESSINGS) ×1 IMPLANT
DRESSING SURGICEL FIBRLLR 1X2 (HEMOSTASIS) ×1 IMPLANT
DRSG SURGICEL FIBRILLAR 1X2 (HEMOSTASIS) ×2
ELECT CAUTERY BLADE 6.4 (BLADE) ×2 IMPLANT
ELECT REM PT RETURN 9FT ADLT (ELECTROSURGICAL) ×2
ELECTRODE REM PT RTRN 9FT ADLT (ELECTROSURGICAL) ×1 IMPLANT
GLOVE SURG ENC MOIS LTX SZ7 (GLOVE) ×2 IMPLANT
GLOVE SURG SYN 8.0 (GLOVE) ×8 IMPLANT
GLOVE SURG UNDER LTX SZ7.5 (GLOVE) ×2 IMPLANT
GOWN STRL REUS W/ TWL LRG LVL3 (GOWN DISPOSABLE) ×1 IMPLANT
GOWN STRL REUS W/ TWL XL LVL3 (GOWN DISPOSABLE) ×2 IMPLANT
GOWN STRL REUS W/TWL LRG LVL3 (GOWN DISPOSABLE) ×2
GOWN STRL REUS W/TWL XL LVL3 (GOWN DISPOSABLE) ×4
IV NS 500ML (IV SOLUTION) ×2
IV NS 500ML BAXH (IV SOLUTION) ×1 IMPLANT
KIT TURNOVER KIT A (KITS) ×2 IMPLANT
LABEL OR SOLS (LABEL) ×2 IMPLANT
LOOP RED MAXI  1X406MM (MISCELLANEOUS) ×1
LOOP VESSEL MAXI 1X406 RED (MISCELLANEOUS) ×1 IMPLANT
LOOP VESSEL MINI 0.8X406 BLUE (MISCELLANEOUS) ×2 IMPLANT
LOOPS BLUE MINI 0.8X406MM (MISCELLANEOUS) ×2
MANIFOLD NEPTUNE II (INSTRUMENTS) ×2 IMPLANT
NEEDLE FILTER BLUNT 18X 1/2SAF (NEEDLE) ×1
NEEDLE FILTER BLUNT 18X1 1/2 (NEEDLE) ×1 IMPLANT
NS IRRIG 500ML POUR BTL (IV SOLUTION) ×2 IMPLANT
PACK EXTREMITY ARMC (MISCELLANEOUS) ×2 IMPLANT
PAD PREP 24X41 OB/GYN DISP (PERSONAL CARE ITEMS) IMPLANT
STOCKINETTE STRL 4IN 9604848 (GAUZE/BANDAGES/DRESSINGS) ×2 IMPLANT
SUT GTX CV-6 30 (SUTURE) ×4 IMPLANT
SUT MNCRL+ 5-0 UNDYED PC-3 (SUTURE) ×1 IMPLANT
SUT MONOCRYL 5-0 (SUTURE) ×1
SUT PROLENE 6 0 BV (SUTURE) ×4 IMPLANT
SUT SILK 2 0 (SUTURE) ×2
SUT SILK 2 0 SH (SUTURE) ×2 IMPLANT
SUT SILK 2-0 18XBRD TIE 12 (SUTURE) ×1 IMPLANT
SUT SILK 3 0 (SUTURE) ×2
SUT SILK 3-0 18XBRD TIE 12 (SUTURE) ×1 IMPLANT
SUT SILK 4 0 (SUTURE) ×2
SUT SILK 4-0 18XBRD TIE 12 (SUTURE) ×1 IMPLANT
SUT VIC AB 3-0 SH 27 (SUTURE) ×4
SUT VIC AB 3-0 SH 27X BRD (SUTURE) ×2 IMPLANT
SYR 20ML LL LF (SYRINGE) ×2 IMPLANT
SYR 3ML LL SCALE MARK (SYRINGE) ×2 IMPLANT

## 2020-04-06 NOTE — H&P (Signed)
$'@LOGO'P$ @   MRN : [id number]  Robin Rogers is a 80 y.o. (1940-04-07) female who presents with chief complaint of need dialysis access.  History of Present Illness:    The patient is seen for evaluation of dialysis access.      Current access is via a left IJ catheter which is functioning well.  There have not been any episodes of catheter infection.  The patient denies amaurosis fugax or recent TIA symptoms. There are no recent neurological changes noted. The patient denies claudication symptoms or rest pain symptoms. The patient denies history of DVT, PE or superficial thrombophlebitis. The patient denies recent episodes of angina or shortness of breath.    Current Meds  Medication Sig  . amLODipine (NORVASC) 10 MG tablet Take by mouth.  Marland Kitchen aspirin EC 81 MG EC tablet Take 1 tablet (81 mg total) by mouth daily.  . carboxymethylcellulose (REFRESH PLUS) 0.5 % SOLN Apply to eye.  . carvedilol (COREG) 6.25 MG tablet Take 1 tablet (6.25 mg total) by mouth 2 (two) times daily with a meal.  . ferrous sulfate 325 (65 FE) MG tablet Take 325 mg by mouth 2 (two) times daily with a meal.  . fluticasone (FLONASE) 50 MCG/ACT nasal spray   . furosemide (LASIX) 40 MG tablet Take 1 tablet (40 mg total) by mouth daily.  . hydrALAZINE (APRESOLINE) 10 MG tablet Take 1 tablet (10 mg total) by mouth 3 (three) times daily.  Marland Kitchen lovastatin (MEVACOR) 40 MG tablet Take 1 tablet by mouth at bedtime.   . melatonin 1 MG TABS tablet Take 2 mg by mouth at bedtime.  . metoprolol tartrate (LOPRESSOR) 25 MG tablet Take 25 mg by mouth 2 (two) times daily.  Marland Kitchen omeprazole (PRILOSEC) 40 MG capsule Take 40 mg by mouth daily.  . polyethylene glycol powder (GLYCOLAX/MIRALAX) 17 GM/SCOOP powder Take by mouth.  . sevelamer carbonate (RENVELA) 800 MG tablet Take 800 mg by mouth 3 (three) times daily with meals.  . sodium bicarbonate 650 MG tablet Take 650 mg by mouth 2 (two) times daily.  . traZODone (DESYREL) 50 MG tablet Take  50 mg by mouth at bedtime.  . Vitamin D, Ergocalciferol, (DRISDOL) 1.25 MG (50000 UNIT) CAPS capsule Take 50,000 Units by mouth every 7 (seven) days. Cyril Loosen)    Past Medical History:  Diagnosis Date  . Anemia of chronic disease   . Blindness   . Chronic combined systolic (congestive) and diastolic (congestive) heart failure ([place])    a. 09/2017 Echo: EF 45-50%, Gr2 DD; b. 05/2019 Echo: EF 40-45%, Gr III DD (restrictive), nl RV fxn, mod elev PASP. Mod dil LA. Mild MR.  . CKD (chronic kidney disease), stage IV ([place])   . Diet-controlled diabetes mellitus ([place])   . Hypertension     Past Surgical History:  Procedure Laterality Date  . CHOLECYSTECTOMY    . DIALYSIS/PERMA CATHETER INSERTION N/A 07/25/2019   Procedure: DIALYSIS/PERMA CATHETER INSERTION;  Surgeon: Algernon Huxley, MD;  Location: [facility];  Service: Cardiovascular;  Laterality: N/A;  . DIALYSIS/PERMA CATHETER INSERTION N/A 11/03/2019   Procedure: DIALYSIS/PERMA CATHETER INSERTION;  Surgeon: Algernon Huxley, MD;  Location: [facility];  Service: Cardiovascular;  Laterality: N/A;  . ESOPHAGOGASTRODUODENOSCOPY N/A 09/24/2017   Procedure: ESOPHAGOGASTRODUODENOSCOPY (EGD);  Surgeon: Toledo, Benay Pike, MD;  Location: [facility];  Service: Gastroenterology;  Laterality: N/A;    Social History Social History   Tobacco Use  . Smoking status: Never Smoker  . Smokeless tobacco: Never  Used  Vaping Use  . Vaping Use: Never used  Substance Use Topics  . Alcohol use: No  . Drug use: Never    Family History History reviewed. No pertinent family history.  No Known Allergies   REVIEW OF SYSTEMS (Negative unless checked)  Constitutional: '[]'$ Weight loss  '[]'$ Fever  '[]'$ Chills Cardiac: '[]'$ Chest pain   '[]'$ Chest pressure   '[]'$ Palpitations   '[]'$ Shortness of breath when laying flat   '[x]'$ Shortness of breath with exertion. Vascular:  '[]'$ Pain in legs with walking   '[]'$ Pain in legs at rest  '[]'$ History of DVT   '[]'$ Phlebitis   '[]'$ Swelling  in legs   '[]'$ Varicose veins   '[]'$ Non-healing ulcers Pulmonary:   '[]'$ Uses home oxygen   '[]'$ Productive cough   '[]'$ Hemoptysis   '[]'$ Wheeze  '[]'$ COPD   '[]'$ Asthma Neurologic:  '[]'$ Dizziness   '[]'$ Seizures   '[]'$ History of stroke   '[]'$ History of TIA  '[]'$ Aphasia   '[]'$ Vissual changes   '[]'$ Weakness or numbness in arm   '[]'$ Weakness or numbness in leg Musculoskeletal:   '[]'$ Joint swelling   '[]'$ Joint pain   '[]'$ Low back pain Hematologic:  '[]'$ Easy bruising  '[]'$ Easy bleeding   '[]'$ Hypercoagulable state   '[]'$ Anemic Gastrointestinal:  '[]'$ Diarrhea   '[]'$ Vomiting  '[]'$ Gastroesophageal reflux/heartburn   '[]'$ Difficulty swallowing. Genitourinary:  '[x]'$ Chronic kidney disease   '[]'$ Difficult urination  '[]'$ Frequent urination   '[]'$ Blood in urine Skin:  '[]'$ Rashes   '[]'$ Ulcers  Psychological:  '[]'$ History of anxiety   '[]'$  History of major depression.  Physical Examination  There were no vitals filed for this visit. There is no height or weight on file to calculate BMI. Gen: WD/WN, NAD Head: [place]/AT, No temporalis wasting.  Ear/Nose/Throat: Hearing grossly intact, nares w/o erythema or drainage Eyes: PER, EOMI, sclera nonicteric.  Neck: Supple, no large masses.   Pulmonary:  Good air movement, no audible wheezing bilaterally, no use of accessory muscles.  Cardiac: RRR, no JVD Vascular: Palpable cephalic vein noted in the left antecubital fossa Vessel Right Left  Radial Palpable Palpable  Ulnar  not palpable  not palpable  Brachial Palpable Palpable  Gastrointestinal: Non-distended. No guarding/no peritoneal signs.  Musculoskeletal: M/S 5/5 throughout.  No deformity or atrophy.  Neurologic: CN 2-12 intact. Symmetrical.  Speech is fluent. Motor exam as listed above. Psychiatric: Judgment intact, Mood & affect appropriate for pt's clinical situation. Dermatologic: No rashes or ulcers noted.  No changes consistent with cellulitis.   CBC Lab Results  Component Value Date   WBC 5.8 04/04/2020   HGB 14.0 04/04/2020   HCT 42.5 04/04/2020   MCV 101.0 (H)  04/04/2020   PLT 234 04/04/2020    BMET    Component Value Date/Time   NA 135 04/04/2020 1407   NA 140 05/19/2013 0757   K 4.9 04/04/2020 1407   K 5.2 (H) 05/19/2013 0757   CL 100 04/04/2020 1407   CL 111 (H) 05/19/2013 0757   CO2 22 04/04/2020 1407   CO2 22 05/19/2013 0757   GLUCOSE 169 (H) 04/04/2020 1407   GLUCOSE 109 (H) 05/19/2013 0757   BUN 49 (H) 04/04/2020 1407   BUN 35 (H) 05/19/2013 0757   CREATININE 5.74 (H) 04/04/2020 1407   CREATININE 1.57 (H) 05/19/2013 0757   CALCIUM 8.1 (L) 04/04/2020 1407   CALCIUM 7.8 (L) 05/15/2019 0456   GFRNONAA 7 (L) 04/04/2020 1407   GFRNONAA 33 (L) 05/19/2013 0757   GFRAA 18 (L) 07/27/2019 0540   GFRAA 38 (L) 05/19/2013 0757   Estimated Creatinine Clearance: 7.3 mL/min (A) (by C-G formula based on SCr  of 5.74 mg/dL (H)).  COAG Lab Results  Component Value Date   INR 1.1 04/04/2020    Radiology No results found.   Assessment/Plan 1. ESRD (end stage renal disease) ([place]) Recommend:  At this time the patient does not have appropriate extremity access for dialysis  Patient should have a left brachial cephalic fistula created (I have also discussed an AV graft if the vein is not adequate).  The risks, benefits and alternative therapies were reviewed in detail with the patient.  All questions were answered.  The patient agrees to proceed with surgery.    2. Primary hypertension Continue antihypertensive medications as already ordered, these medications have been reviewed and there are no changes at this time.   3. 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  Hgb A1C to be monitored as already arranged by primary service   [name], MD  04/06/2020 7:39 AM

## 2020-04-06 NOTE — Interval H&P Note (Signed)
History and Physical Interval Note:  04/06/2020 7:48 AM  Robin Rogers  has presented today for surgery, with the diagnosis of ESRD.  The various methods of treatment have been discussed with the patient and family. After consideration of risks, benefits and other options for treatment, the patient has consented to  Procedure(s): INSERTION OF ARTERIOVENOUS (AV) GORE-TEX GRAFT ARM (BRACHIAL CEPHALIC) (Left) as a surgical intervention.  The patient's history has been reviewed, patient examined, no change in status, stable for surgery.  I have reviewed the patient's chart and labs.  Questions were answered to the patient's satisfaction.     [name]

## 2020-04-06 NOTE — Anesthesia Postprocedure Evaluation (Signed)
Anesthesia Post Note  Patient: Robin Rogers  Procedure(s) Performed: ARTERIOVENOUS (AV) FISTULA CREATION (Left )  Patient location during evaluation: PACU Anesthesia Type: General Level of consciousness: awake and alert Pain management: pain level controlled Vital Signs Assessment: post-procedure vital signs reviewed and stable Respiratory status: spontaneous breathing, nonlabored ventilation, respiratory function stable and patient connected to nasal cannula oxygen Cardiovascular status: blood pressure returned to baseline and stable Postop Assessment: no apparent nausea or vomiting Anesthetic complications: no   No complications documented.   Last Vitals:  Vitals:   04/06/20 1215 04/06/20 1241  BP: (!) 161/58 (!) 157/46  Pulse: 64 70  Resp: 13 20  Temp: 36.6 C (!) 36.2 C  SpO2: 99% 93%    Last Pain:  Vitals:   04/06/20 1241  TempSrc: Temporal  PainSc: 0-No pain                 Martha Clan

## 2020-04-06 NOTE — Anesthesia Procedure Notes (Signed)
Procedure Name: Intubation Date/Time: 04/06/2020 10:01 AM Performed by: Zetta Bills, CRNA Pre-anesthesia Checklist: Patient identified, Emergency Drugs available, Suction available and Patient being monitored Patient Re-evaluated:Patient Re-evaluated prior to induction Oxygen Delivery Method: Circle system utilized Preoxygenation: Pre-oxygenation with 100% oxygen Induction Type: IV induction Ventilation: Mask ventilation without difficulty Laryngoscope Size: Mac and 3 Grade View: Grade I Tube type: Oral Tube size: 7.0 mm Number of attempts: 1 Airway Equipment and Method: Stylet Placement Confirmation: ETT inserted through vocal cords under direct vision Secured at: 20 cm Tube secured with: Tape Dental Injury: Teeth and Oropharynx as per pre-operative assessment

## 2020-04-06 NOTE — Anesthesia Preprocedure Evaluation (Signed)
Anesthesia Evaluation  Patient identified by MRN, date of birth, ID band Patient awake    Reviewed: Allergy & Precautions, H&P , NPO status , Patient's Chart, lab work & pertinent test results  History of Anesthesia Complications Negative for: history of anesthetic complications  Airway Mallampati: III  TM Distance: >3 FB Neck ROM: full    Dental  (+) Chipped, Poor Dentition, Missing   Pulmonary neg pulmonary ROS, neg shortness of breath,    Pulmonary exam normal        Cardiovascular hypertension, +CHF  Normal cardiovascular exam     Neuro/Psych negative neurological ROS  negative psych ROS   GI/Hepatic negative GI ROS, Neg liver ROS,   Endo/Other  negative endocrine ROSdiabetes, Type 2  Renal/GU Renal disease     Musculoskeletal   Abdominal   Peds  Hematology negative hematology ROS (+)   Anesthesia Other Findings Past Medical History: No date: Anemia of chronic disease No date: Blindness No date: Chronic combined systolic (congestive) and diastolic  (congestive) heart failure ([place])     Comment:  a. 09/2017 Echo: EF 45-50%, Gr2 DD; b. 05/2019 Echo: EF               40-45%, Gr III DD (restrictive), nl RV fxn, mod elev               PASP. Mod dil LA. Mild MR. No date: CKD (chronic kidney disease), stage IV (HCC) No date: Diet-controlled diabetes mellitus ([place]) No date: Hypertension  Past Surgical History: No date: CHOLECYSTECTOMY 07/25/2019: DIALYSIS/PERMA CATHETER INSERTION; N/A     Comment:  Procedure: DIALYSIS/PERMA CATHETER INSERTION;  Surgeon:               Algernon Huxley, MD;  Location: [facility];                Service: Cardiovascular;  Laterality: N/A; 11/03/2019: DIALYSIS/PERMA CATHETER INSERTION; N/A     Comment:  Procedure: DIALYSIS/PERMA CATHETER INSERTION;  Surgeon:               Algernon Huxley, MD;  Location: [facility];                Service: Cardiovascular;  Laterality:  N/A; 09/24/2017: ESOPHAGOGASTRODUODENOSCOPY; N/A     Comment:  Procedure: ESOPHAGOGASTRODUODENOSCOPY (EGD);  Surgeon:               Toledo, Benay Pike, MD;  Location: [facility];                Service: Gastroenterology;  Laterality: N/A;  BMI    Body Mass Index: 24.80 kg/m      Reproductive/Obstetrics negative OB ROS                             Anesthesia Physical Anesthesia Plan  ASA: III  Anesthesia Plan: General ETT   Post-op Pain Management:    Induction: Intravenous  PONV Risk Score and Plan: Ondansetron, Dexamethasone, Midazolam and Treatment may vary due to age or medical condition  Airway Management Planned: Oral ETT  Additional Equipment:   Intra-op Plan:   Post-operative Plan: Extubation in OR  Informed Consent: I have reviewed the patients History and Physical, chart, labs and discussed the procedure including the risks, benefits and alternatives for the proposed anesthesia with the patient or authorized representative who has indicated his/her understanding and acceptance.     Dental Advisory Given  Plan  Discussed with: Anesthesiologist, CRNA and Surgeon  Anesthesia Plan Comments: (This procedure has been fully reviewed with the patient and written informed consent has been obtained.  Patient consented for risks of anesthesia including but not limited to:  - adverse reactions to medications - damage to eyes, teeth, lips or other oral mucosa - nerve damage due to positioning  - sore throat or hoarseness - Damage to heart, brain, nerves, lungs, other parts of body or loss of life  Patient voiced understanding.)        Anesthesia Quick Evaluation

## 2020-04-06 NOTE — Transfer of Care (Signed)
Immediate Anesthesia Transfer of Care Note  Patient: Robin Rogers  Procedure(s) Performed: ARTERIOVENOUS (AV) FISTULA CREATION (Left )  Patient Location: PACU  Anesthesia Type:General  Level of Consciousness: awake  Airway & Oxygen Therapy: Patient Spontanous Breathing  Post-op Assessment: Report given to RN  Post vital signs: stable  Last Vitals:  Vitals Value Taken Time  BP 156/49 04/06/20 1134  Temp    Pulse 69 04/06/20 1141  Resp 15 04/06/20 1141  SpO2 99 % 04/06/20 1141  Vitals shown include unvalidated device data.  Last Pain:  Vitals:   04/06/20 0737  TempSrc: Oral  PainSc: 0-No pain      Patients Stated Pain Goal: 0 ([id number])  Complications: No complications documented.

## 2020-04-07 ENCOUNTER — Encounter: Payer: Self-pay | Admitting: Vascular Surgery

## 2020-04-12 NOTE — Op Note (Signed)
     OPERATIVE NOTE   PROCEDURE: left brachial cephalic arteriovenous fistula placement  PRE-OPERATIVE DIAGNOSIS: End Stage Renal Disease  POST-OPERATIVE DIAGNOSIS: End Stage Renal Disease  SURGEON: [name]  ASSISTANT(S): None  ANESTHESIA: general  ESTIMATED BLOOD LOSS: <50 cc  FINDING(S): 3.5 mm vein  SPECIMEN(S):  none  INDICATIONS:   Robin Rogers is a 80 y.o. female who presents with end stage renal disease.  The patient is scheduled for left brachiocephalic arteriovenous fistula placement.  The patient is aware the risks include but are not limited to: bleeding, infection, steal syndrome, nerve damage, ischemic monomelic neuropathy, failure to mature, and need for additional procedures.  The patient is aware of the risks of the procedure and elects to proceed forward.  DESCRIPTION: After full informed written consent was obtained from the patient, the patient was brought back to the operating room and placed supine upon the operating table.  Prior to induction, the patient received IV antibiotics.   After obtaining adequate anesthesia, the patient was then prepped and draped in the standard fashion for a left arm access procedure.   A first assistant was required to provide a safe and appropriate environment for executing the surgery.  The assistant was integral in providing retraction, exposure, running suture providing suction and in the closing process.   A curvilinear incision was then created midway between the radial impulse and the cephalic vein. The cephalic vein was then identified and dissected circumferentially. It was marked with a surgical marker.    Attention was then turned to the brachial artery which was exposed through the same incision and looped proximally and distally. Side branches were controlled with 4-0 silk ties.  The distal segment of the vein was ligated with a  2-0 silk, and the vein was transected.  The proximal segment was  interrogated with serial dilators.  The vein accepted up to a 3.5 mm dilator without any difficulty. Heparinized saline was infused into the vein and clamped it with a small bulldog.  At this point, I reset my exposure of the brachial artery and controlled the artery with vessel loops proximally and distally.  An arteriotomy was then made with a #11 blade, and extended with a Potts scissor.  Heparinized saline was injected proximal and distal into the radial artery.  The vein was then approximated to the artery while the artery was in its native bed and subsequently the vein was beveled using Potts scissors. The vein was then sewn to the artery in an end-to-side configuration with a running stitch of 6-0 Prolene.  Prior to completing this anastomosis Flushing maneuvers were performed and the artery was allowed to forward and back bleed.  There was no evidence of clot from any vessels.  I completed the anastomosis in the usual fashion and then released all vessel loops and clamps.    There was good  thrill in the venous outflow, and there was 1+ palpable radial pulse.  At this point, I irrigated out the surgical wound.  There was no further active bleeding.  The subcutaneous tissue was reapproximated with a running stitch of 3-0 Vicryl.  The skin was then reapproximated with a running subcuticular stitch of 4-0 Vicryl.  The skin was then cleaned, dried, and reinforced with Dermabond.    The patient tolerated this procedure well.   COMPLICATIONS: None  CONDITION: [facility]  Office: [phone]   04/12/2020, 10:03 AM

## 2020-04-16 ENCOUNTER — Telehealth (INDEPENDENT_AMBULATORY_CARE_PROVIDER_SITE_OTHER): Payer: Self-pay

## 2020-04-16 NOTE — Telephone Encounter (Signed)
Elizabeth from [facility] called to make our office aware that the pt didn't have her scheduled 3 visits a weeks last week due to a scheduling issue but going forward the pt will receive all 3 visits as requested.

## 2020-05-02 ENCOUNTER — Telehealth (INDEPENDENT_AMBULATORY_CARE_PROVIDER_SITE_OTHER): Payer: Self-pay

## 2020-05-02 NOTE — Telephone Encounter (Signed)
Robin Rogers from [facility] home health left a voicemail informing the office that she tried to reach out the patient for nursing visit but she did not get an response.

## 2020-05-08 ENCOUNTER — Telehealth (INDEPENDENT_AMBULATORY_CARE_PROVIDER_SITE_OTHER): Payer: Self-pay

## 2020-05-08 NOTE — Telephone Encounter (Signed)
The patient needs a follow up with HDA.  We have not seen the patient post op to evaluate

## 2020-05-08 NOTE — Telephone Encounter (Signed)
Attempted to call the patients daughter as requested with interpreter. LVM for patient to callback to be scheduled.

## 2020-08-28 ENCOUNTER — Other Ambulatory Visit: Payer: Self-pay

## 2020-08-28 ENCOUNTER — Emergency Department: Payer: Medicare Other

## 2020-08-28 ENCOUNTER — Emergency Department
Admission: EM | Admit: 2020-08-28 | Discharge: 2020-08-29 | Disposition: A | Discharge status: Home / Self Care | Payer: Medicare Other | Attending: Emergency Medicine | Admitting: Emergency Medicine

## 2020-08-28 DIAGNOSIS — Z79899 Other long term (current) drug therapy: Secondary | ICD-10-CM | POA: Insufficient documentation

## 2020-08-28 DIAGNOSIS — R1011 Right upper quadrant pain: Secondary | ICD-10-CM | POA: Insufficient documentation

## 2020-08-28 DIAGNOSIS — E1122 Type 2 diabetes mellitus with diabetic chronic kidney disease: Secondary | ICD-10-CM | POA: Insufficient documentation

## 2020-08-28 DIAGNOSIS — Z20822 Contact with and (suspected) exposure to covid-19: Secondary | ICD-10-CM | POA: Insufficient documentation

## 2020-08-28 DIAGNOSIS — R0602 Shortness of breath: Secondary | ICD-10-CM | POA: Diagnosis not present

## 2020-08-28 DIAGNOSIS — Z7982 Long term (current) use of aspirin: Secondary | ICD-10-CM | POA: Insufficient documentation

## 2020-08-28 DIAGNOSIS — R1010 Upper abdominal pain, unspecified: Secondary | ICD-10-CM

## 2020-08-28 DIAGNOSIS — R0781 Pleurodynia: Secondary | ICD-10-CM | POA: Insufficient documentation

## 2020-08-28 DIAGNOSIS — R109 Unspecified abdominal pain: Secondary | ICD-10-CM | POA: Diagnosis present

## 2020-08-28 DIAGNOSIS — N185 Chronic kidney disease, stage 5: Secondary | ICD-10-CM | POA: Diagnosis not present

## 2020-08-28 DIAGNOSIS — I5042 Chronic combined systolic (congestive) and diastolic (congestive) heart failure: Secondary | ICD-10-CM | POA: Insufficient documentation

## 2020-08-28 DIAGNOSIS — I132 Hypertensive heart and chronic kidney disease with heart failure and with stage 5 chronic kidney disease, or end stage renal disease: Secondary | ICD-10-CM | POA: Insufficient documentation

## 2020-08-28 LAB — COMPREHENSIVE METABOLIC PANEL
ALT: 12 U/L (ref 0–44)
AST: 18 U/L (ref 15–41)
Albumin: 3.2 g/dL — ABNORMAL LOW (ref 3.5–5.0)
Alkaline Phosphatase: 163 U/L — ABNORMAL HIGH (ref 38–126)
Anion gap: 12 (ref 5–15)
BUN: 23 mg/dL (ref 8–23)
CO2: 25 mmol/L (ref 22–32)
Calcium: 8.4 mg/dL — ABNORMAL LOW (ref 8.9–10.3)
Chloride: 96 mmol/L — ABNORMAL LOW (ref 98–111)
Creatinine, Ser: 2.53 mg/dL — ABNORMAL HIGH (ref 0.44–1.00)
GFR, Estimated: 19 mL/min — ABNORMAL LOW (ref >60)
Glucose, Bld: 100 mg/dL — ABNORMAL HIGH (ref 70–99)
Potassium: 3.7 mmol/L (ref 3.5–5.1)
Sodium: 133 mmol/L — ABNORMAL LOW (ref 135–145)
Total Bilirubin: 1.3 mg/dL — ABNORMAL HIGH (ref 0.3–1.2)
Total Protein: 7.4 g/dL (ref 6.5–8.1)

## 2020-08-28 LAB — CBC
HCT: 31.4 % — ABNORMAL LOW (ref 36.0–46.0)
Hemoglobin: 10.6 g/dL — ABNORMAL LOW (ref 12.0–15.0)
MCH: 34.1 pg — ABNORMAL HIGH (ref 26.0–34.0)
MCHC: 33.8 g/dL (ref 30.0–36.0)
MCV: 101 fL — ABNORMAL HIGH (ref 80.0–100.0)
Platelets: 221 10*3/uL (ref 150–400)
RBC: 3.11 MIL/uL — ABNORMAL LOW (ref 3.87–5.11)
RDW: 13.3 % (ref 11.5–15.5)
WBC: 6.5 10*3/uL (ref 4.0–10.5)
nRBC: 0 % (ref 0.0–0.2)

## 2020-08-28 LAB — RESP PANEL BY RT-PCR (FLU A&B, COVID) ARPGX2
Influenza A by PCR: NEGATIVE
Influenza B by PCR: NEGATIVE
SARS Coronavirus 2 by RT PCR: NEGATIVE

## 2020-08-28 LAB — TROPONIN I (HIGH SENSITIVITY)
Troponin I (High Sensitivity): 14 ng/L (ref <18)
Troponin I (High Sensitivity): 14 ng/L (ref <18)

## 2020-08-28 LAB — LIPASE, BLOOD: Lipase: 27 U/L (ref 11–51)

## 2020-08-28 MED ORDER — LIDOCAINE 5 % EX PTCH
1.0000 | MEDICATED_PATCH | CUTANEOUS | Status: DC
  Administered 2020-08-28: 1 via TRANSDERMAL
  Filled 2020-08-28: qty 1

## 2020-08-28 MED ORDER — OXYCODONE HCL 5 MG PO TABS
5.0000 mg | ORAL_TABLET | Freq: Once | ORAL | Status: AC
Start: 2020-08-28 — End: 2020-08-28
  Administered 2020-08-28: 5 mg via ORAL
  Filled 2020-08-28: qty 1

## 2020-08-28 MED ORDER — IOHEXOL 350 MG/ML SOLN
100.0000 mL | Freq: Once | INTRAVENOUS | Status: AC | PRN
  Administered 2020-08-28: 100 mL via INTRAVENOUS

## 2020-08-28 NOTE — ED Provider Notes (Signed)
[facility] Emergency Department Provider Note  ____________________________________________   Event Date/Time   First MD Initiated Contact with Patient 08/28/20 2045     (approximate)  I have reviewed the triage vital signs and the nursing notes.   HISTORY  Chief Complaint Abdominal Pain    HPI Robin Rogers is a 80 y.o. female with CKD, hypertension, CHF who comes in with concerns for abdominal pain.  Patient is blind at baseline.  She is on dialysis.  She goes Tuesdays, Thursdays, Saturdays.  She was last dialyzed today.  She comes in with abdominal and back pain.  The pain is in the right upper quadrant and wraps around the back for the past week.  Denies any other symptoms associated with it except for little bit of shortness of breath, pleuritic chest pain.  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              Past Medical History:  Diagnosis Date   Anemia of chronic disease    Blindness    Chronic combined systolic (congestive) and diastolic (congestive) heart failure ([place])    a. 09/2017 Echo: EF 45-50%, Gr2 DD; b. 05/2019 Echo: EF 40-45%, Gr III DD (restrictive), nl RV fxn, mod elev PASP. Mod dil LA. Mild MR.   CKD (chronic kidney disease), stage IV (HCC)    Diet-controlled diabetes mellitus ([place])    Hypertension     Patient Active Problem List   Diagnosis Date Noted   Goals of care, counseling/discussion    Palliative care by specialist    ESRD (end stage renal disease) ([place])    CKD (chronic kidney disease), stage V ([place])    Acute on chronic HFrEF (heart failure with reduced ejection fraction) (HCC)    Chronic anemia    CKD (chronic kidney disease), stage IV ([place]) 05/31/2019   Insomnia 05/31/2019   Generalized weakness 05/31/2019   Anemia due to chronic kidney disease 05/31/2019   Blind in both eyes 05/31/2019   Hypertensive urgency  05/31/2019   Hyponatremia 05/30/2019   ARF (acute renal failure) ([place]) 05/10/2019   Acute on chronic heart failure with reduced ejection fraction and diastolic dysfunction ([place]) 05/10/2019   Chronic combined systolic and diastolic CHF (congestive heart failure) ([place]) 10/09/2017   HTN (hypertension) 10/09/2017   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 ([place]) 10/09/2017   Intractable nausea and vomiting 09/22/2017    Past Surgical History:  Procedure Laterality Date   AV FISTULA PLACEMENT Left 04/06/2020   Procedure: ARTERIOVENOUS (AV) FISTULA CREATION;  Surgeon: Katha Cabal, MD;  Location: [facility];  Service: Vascular;  Laterality: Left;   CHOLECYSTECTOMY     DIALYSIS/PERMA CATHETER INSERTION N/A 07/25/2019   Procedure: DIALYSIS/PERMA CATHETER INSERTION;  Surgeon: Algernon Huxley, MD;  Location: [facility];  Service: Cardiovascular;  Laterality: N/A;   DIALYSIS/PERMA CATHETER INSERTION N/A 11/03/2019   Procedure: DIALYSIS/PERMA CATHETER INSERTION;  Surgeon: Algernon Huxley, MD;  Location: [place] [facility];  Service: Cardiovascular;  Laterality: N/A;   ESOPHAGOGASTRODUODENOSCOPY N/A 09/24/2017   Procedure: ESOPHAGOGASTRODUODENOSCOPY (EGD);  Surgeon: Toledo, Benay Pike, MD;  Location: [facility];  Service: Gastroenterology;  Laterality: N/A;    Prior to Admission medications   Medication Sig Start Date End Date Taking? Authorizing Provider  amLODipine (NORVASC) 10 MG tablet Take by mouth.    [provider]  aspirin EC  81 MG EC tablet Take 1 tablet (81 mg total) by mouth daily. 05/16/19   Swayze, Ava, DO  bacitracin ointment Apply to wound on left cheek twice per day 04/06/20   Schnier, Dolores Lory, MD  carboxymethylcellulose (REFRESH PLUS) 0.5 % SOLN Apply to eye.    [provider]  carvedilol (COREG) 6.25 MG tablet Take 1 tablet (6.25 mg total) by mouth 2 (two) times daily with a meal. 08/05/19   Loel Dubonnet, NP  ferrous  sulfate 325 (65 FE) MG tablet Take 325 mg by mouth 2 (two) times daily with a meal.    [provider]  fluticasone (FLONASE) 50 MCG/ACT nasal spray  05/25/19   [provider]  furosemide (LASIX) 40 MG tablet Take 1 tablet (40 mg total) by mouth daily. 07/28/19   Lorella Nimrod, MD  hydrALAZINE (APRESOLINE) 10 MG tablet Take 1 tablet (10 mg total) by mouth 3 (three) times daily. 08/06/19   Loel Dubonnet, NP  HYDROcodone-acetaminophen (NORCO) 5-325 MG tablet Take 1-2 tablets by mouth every 6 (six) hours as needed for moderate pain or severe pain. 04/06/20   Schnier, Dolores Lory, MD  lactulose ([place]) 10 GM/15ML solution TAKE 15MLS BY MOUTH THREE TIMES DAILY Patient not taking: Reported on 04/04/2020 05/16/19   [provider]  lovastatin (MEVACOR) 40 MG tablet Take 1 tablet by mouth at bedtime.  01/21/17   [provider]  melatonin 1 MG TABS tablet Take 2 mg by mouth at bedtime.    [provider]  metoprolol tartrate (LOPRESSOR) 25 MG tablet Take 25 mg by mouth 2 (two) times daily. 10/25/19   [provider]  omeprazole (PRILOSEC) 40 MG capsule Take 40 mg by mouth daily.    [provider]  ondansetron (ZOFRAN-ODT) 4 MG disintegrating tablet Take 4 mg by mouth every 8 (eight) hours as needed. 05/25/19   [provider]  polyethylene glycol powder (GLYCOLAX/MIRALAX) 17 GM/SCOOP powder Take by mouth. 09/25/17   [provider]  sevelamer carbonate (RENVELA) 800 MG tablet Take 800 mg by mouth 3 (three) times daily with meals.    [provider]  sodium bicarbonate 650 MG tablet Take 650 mg by mouth 2 (two) times daily.    [provider]  traZODone (DESYREL) 50 MG tablet Take 50 mg by mouth at bedtime. 05/25/19   [provider]  Vitamin D, Ergocalciferol, (DRISDOL) 1.25 MG (50000 UNIT) CAPS capsule Take 50,000 Units by mouth every 7 (seven) days. [facility])    [provider]     Allergies Patient has no known allergies.  No family history on file.  Social History Social History   Tobacco Use   Smoking status: Never   Smokeless tobacco: Never  Vaping Use   Vaping Use: Never used  Substance Use Topics   Alcohol use: No   Drug use: Never      Review of Systems Constitutional: No fever/chills Eyes: No visual changes. ENT: No sore throat. Cardiovascular: Denies chest pain. Respiratory: Positive shortness of breath Gastrointestinal: Positive abdominal pain no nausea, no vomiting.  No diarrhea.  No constipation. Genitourinary: Negative for dysuria. Musculoskeletal: Negative for back pain. Skin: Negative for rash. Neurological: Negative for headaches, focal weakness or numbness. All other ROS negative ____________________________________________   PHYSICAL EXAM:  VITAL SIGNS: ED Triage Vitals  Enc Vitals Group     BP 08/28/20 1527 (!) 187/55     Pulse Rate 08/28/20 1527 65     Resp 08/28/20 1527  18     Temp 08/28/20 1527 98.2 F (36.8 C)     Temp Source 08/28/20 1527 Oral     SpO2 08/28/20 1527 97 %     Weight 08/28/20 1528 160 lb (72.6 kg)     Height 08/28/20 1528 '5\' 4"'$  (1.626 m)     Head Circumference --      Peak Flow --      Pain Score 08/28/20 1528 10     Pain Loc --      Pain Edu? --      Excl. in [place]? --     Constitutional: Alert and oriented. Well appearing and in no acute distress. Eyes: Conjunctivae are normal. EOMI. Head: Atraumatic. Nose: No congestion/rhinnorhea. Mouth/Throat: Mucous membranes are moist.   Neck: No stridor. Trachea Midline. FROM Cardiovascular: Normal rate, regular rhythm. Grossly normal heart sounds.  Good peripheral circulation. Respiratory: Normal respiratory effort.  No retractions. Lungs CTAB. Gastrointestinal: Tender RUQ No distention. No abdominal bruits.  Musculoskeletal: No lower extremity tenderness nor edema.  No joint effusions. Neurologic:  Normal speech and language. No gross focal  neurologic deficits are appreciated.  Skin:  Skin is warm, dry and intact. No rash noted. Psychiatric: Mood and affect are normal. Speech and behavior are normal. GU: Deferred   ____________________________________________   LABS (all labs ordered are listed, but only abnormal results are displayed)  Labs Reviewed  COMPREHENSIVE METABOLIC PANEL - Abnormal; Notable for the following components:      Result Value   Sodium 133 (*)    Chloride 96 (*)    Glucose, Bld 100 (*)    Creatinine, Ser 2.53 (*)    Calcium 8.4 (*)    Albumin 3.2 (*)    Alkaline Phosphatase 163 (*)    Total Bilirubin 1.3 (*)    GFR, Estimated 19 (*)    All other components within normal limits  CBC - Abnormal; Notable for the following components:   RBC 3.11 (*)    Hemoglobin 10.6 (*)    HCT 31.4 (*)    MCV 101.0 (*)    MCH 34.1 (*)    All other components within normal limits  LIPASE, BLOOD  URINALYSIS, COMPLETE (UACMP) WITH MICROSCOPIC  TROPONIN I (HIGH SENSITIVITY)   ____________________________________________   ED ECG REPORT I, Vanessa Euclid, the attending physician, personally viewed and interpreted this ECG.  Normal sinus rate of 66, no ST elevation, T wave version in aVL, normal intervals ____________________________________________  RADIOLOGY Robert Bellow, personally viewed and evaluated these images (plain radiographs) as part of my medical decision making, as well as reviewing the written report by the radiologist.  ED MD interpretation:  no PNA   Official radiology report(s): DG Chest 2 View  Result Date: 08/28/2020 CLINICAL DATA:  Shortness of breath EXAM: CHEST - 2 VIEW COMPARISON:  07/26/2019 FINDINGS: Left dialysis catheter in place with tip in the right atrium. Cardiomegaly. No overt edema or confluent opacities. Pleural calcifications at the right lung base. IMPRESSION: Cardiomegaly.  No active disease. Electronically Signed   By: Rolm Baptise M.D.   On: 08/28/2020 21:48     ____________________________________________   PROCEDURES  Procedure(s) performed (including Critical Care):  .1-3 Lead EKG Interpretation  Date/Time: 08/29/2020 12:32 AM Performed by: Vanessa Sophia, MD Authorized by: Vanessa Oketo, MD     Interpretation: normal     ECG rate:  60s   ECG rate assessment: normal     Rhythm: sinus rhythm  Ectopy: none     Conduction: normal     ____________________________________________   INITIAL IMPRESSION / ASSESSMENT AND PLAN / ED COURSE  KANIJA ROMAIN was evaluated in Emergency Department on 08/28/2020 for the symptoms described in the history of present illness. She was evaluated in the context of the global COVID-19 pandemic, which necessitated consideration that the patient might be at risk for infection with the SARS-CoV-2 virus that causes COVID-19. Institutional protocols and algorithms that pertain to the evaluation of patients at risk for COVID-19 are in a state of rapid change based on information released by regulatory bodies including the CDC and federal and state organizations. These policies and algorithms were followed during the patient's care in the ED.    Patient with ESRD who comes in with concerns for right-sided abdominal pain wrapping around to her back as well as some shortness of breath and pain with breathing.  Labs ordered evaluate for Electra MIs, AKI, retained stone.  Labs are overall reassuring with slightly elevated total bili.  Patient is already had her gallbladder removed so do not feel a repeat ultrasound is necessary.  Will get chest x-ray to look for any of effusion that could be causing patient's discomfort.  This is negative we will need to proceed with CT imaging to rule out PE as well as other acute abdominal pathology  CT scan is below  IMPRESSION: 1. No evidence of significant pulmonary embolus. 2. Diffuse aortic atherosclerosis and diffuse vascular calcifications. 3. Cardiac enlargement. 4.  Dialysis catheter in place with adjacent filling defect, likely mixing but possibly thrombus. 5. 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 6. Bilateral renal parenchymal thinning consistent with chronic medical renal disease.  Given concern for possible thrombus on the dialysis catheter I did discuss the case with Dr. Juleen China who recommended nothing to do tonight and they can make sure that the dialysis catheter works at the next session and if not they can do some interventions for it.  Patient is reportedly feeling better after medications.  I suspect this is most likely musculoskeletal in nature.  No evidence of rash.  Did update granddaughter on the phone that work-up was reassuring they feel comfortable with patient going home.        ____________________________________________   FINAL CLINICAL IMPRESSION(S) / ED DIAGNOSES   Final diagnoses:  Upper abdominal pain      MEDICATIONS GIVEN DURING THIS VISIT:  Medications  lidocaine (LIDODERM) 5 % 1 patch (1 patch Transdermal Patch Applied 08/28/20 2130)  oxyCODONE (Oxy IR/ROXICODONE) immediate release tablet 5 mg (5 mg Oral Given 08/28/20 2129)  iohexol (OMNIPAQUE) 350 MG/ML injection 100 mL (100 mLs Intravenous Contrast Given 08/28/20 2258)     ED Discharge Orders          Ordered    lidocaine (LIDODERM) 5 %  Every 12 hours        08/29/20 0034             Note:  This document was prepared using Dragon voice recognition software and may include unintentional dictation errors.    Vanessa Wallace, MD 08/29/20 [phone]

## 2020-08-28 NOTE — ED Triage Notes (Addendum)
Pt comes into the ED via EMS from home with c/o abd and back pain, pt is spanish speaking and blind.  Pt c/o RUQ pain that radiates around to the back for the past week,. Denies N/V/D or fever  178/99 HR74 97%RA 98.2 temp

## 2020-08-28 NOTE — ED Notes (Signed)
Patient transported to CT 

## 2020-08-28 NOTE — ED Notes (Signed)
Patient transported to X-ray 

## 2020-08-29 LAB — URINALYSIS, COMPLETE (UACMP) WITH MICROSCOPIC
Bacteria, UA: NONE SEEN
Glucose, UA: NEGATIVE mg/dL
Hgb urine dipstick: NEGATIVE
Ketones, ur: NEGATIVE mg/dL
Leukocytes,Ua: NEGATIVE
Nitrite: NEGATIVE
Protein, ur: 30 mg/dL — AB
Specific Gravity, Urine: 1.028 (ref 1.005–1.030)
pH: 5 (ref 5.0–8.0)

## 2020-08-29 MED ORDER — LIDOCAINE 5 % EX PTCH: 1.0000 | MEDICATED_PATCH | Freq: Two times a day (BID) | CUTANEOUS | 0 refills | Status: AC

## 2020-08-29 NOTE — Discharge Instructions (Addendum)
Take Tylenol 1 g every 8 hours and the lidocaine patches to help with pain.  Your CT as below is otherwise reassuring.  Return to the ER for develop worsening pain, shortness of breath, fevers or any other concerns   IMPRESSION: 1. No evidence of significant pulmonary embolus. 2. Diffuse aortic atherosclerosis and diffuse vascular calcifications. 3. Cardiac enlargement. 4. Dialysis catheter in place with adjacent filling defect, likely mixing but possibly thrombus. 5. 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 6. Bilateral renal parenchymal thinning consistent with chronic medical renal disease.

## 2020-08-30 LAB — URINE CULTURE

## 2020-08-31 ENCOUNTER — Other Ambulatory Visit (INDEPENDENT_AMBULATORY_CARE_PROVIDER_SITE_OTHER): Payer: Self-pay | Admitting: Vascular Surgery

## 2020-08-31 DIAGNOSIS — N186 End stage renal disease: Secondary | ICD-10-CM

## 2020-08-31 DIAGNOSIS — T829XXS Unspecified complication of cardiac and vascular prosthetic device, implant and graft, sequela: Secondary | ICD-10-CM

## 2020-09-03 ENCOUNTER — Encounter (INDEPENDENT_AMBULATORY_CARE_PROVIDER_SITE_OTHER): Payer: Medicare Other

## 2020-09-03 ENCOUNTER — Ambulatory Visit (INDEPENDENT_AMBULATORY_CARE_PROVIDER_SITE_OTHER): Payer: Medicare Other | Admitting: Vascular Surgery

## 2020-09-03 ENCOUNTER — Other Ambulatory Visit: Payer: Self-pay

## 2020-09-03 ENCOUNTER — Ambulatory Visit (INDEPENDENT_AMBULATORY_CARE_PROVIDER_SITE_OTHER): Payer: Medicare Other

## 2020-09-03 DIAGNOSIS — N186 End stage renal disease: Secondary | ICD-10-CM | POA: Diagnosis not present

## 2020-09-03 DIAGNOSIS — T829XXS Unspecified complication of cardiac and vascular prosthetic device, implant and graft, sequela: Secondary | ICD-10-CM

## 2020-09-17 ENCOUNTER — Telehealth (INDEPENDENT_AMBULATORY_CARE_PROVIDER_SITE_OTHER): Payer: Self-pay | Admitting: Vascular Surgery

## 2020-09-17 NOTE — Telephone Encounter (Signed)
[place] tech made me aware that patient had not been seen since fistula creation and ultrasound and will need to be seen by provider. I called daughter to setup follow--up appt. Daughter did not want to setup appt right away and informed me that she would callback to setup appt.   Patient had US done 09/03/20 (GS was out of clinic and wasn't able to see her to give [place] results)   I asked GS if he could call with results for daughter states its hard getting patient to appts. GS advised me that they typically like to see fistula in person to determine if it is ok to use as well as go over [place] results. I made daughter aware.    This note is for documentation purposes only.

## 2020-09-21 ENCOUNTER — Emergency Department
Admission: EM | Admit: 2020-09-21 | Discharge: 2020-09-21 | Disposition: A | Discharge status: Home / Self Care | Payer: Medicare Other | Attending: Emergency Medicine | Admitting: Emergency Medicine

## 2020-09-21 ENCOUNTER — Encounter: Payer: Self-pay | Admitting: Emergency Medicine

## 2020-09-21 ENCOUNTER — Emergency Department: Payer: Medicare Other

## 2020-09-21 ENCOUNTER — Other Ambulatory Visit: Payer: Self-pay

## 2020-09-21 DIAGNOSIS — R531 Weakness: Secondary | ICD-10-CM | POA: Insufficient documentation

## 2020-09-21 DIAGNOSIS — E1122 Type 2 diabetes mellitus with diabetic chronic kidney disease: Secondary | ICD-10-CM | POA: Insufficient documentation

## 2020-09-21 DIAGNOSIS — I132 Hypertensive heart and chronic kidney disease with heart failure and with stage 5 chronic kidney disease, or end stage renal disease: Secondary | ICD-10-CM | POA: Insufficient documentation

## 2020-09-21 DIAGNOSIS — R42 Dizziness and giddiness: Secondary | ICD-10-CM | POA: Insufficient documentation

## 2020-09-21 DIAGNOSIS — Z7982 Long term (current) use of aspirin: Secondary | ICD-10-CM | POA: Diagnosis not present

## 2020-09-21 DIAGNOSIS — N186 End stage renal disease: Secondary | ICD-10-CM | POA: Insufficient documentation

## 2020-09-21 DIAGNOSIS — E871 Hypo-osmolality and hyponatremia: Secondary | ICD-10-CM | POA: Insufficient documentation

## 2020-09-21 DIAGNOSIS — D631 Anemia in chronic kidney disease: Secondary | ICD-10-CM | POA: Diagnosis not present

## 2020-09-21 DIAGNOSIS — Z20822 Contact with and (suspected) exposure to covid-19: Secondary | ICD-10-CM | POA: Diagnosis not present

## 2020-09-21 DIAGNOSIS — Z992 Dependence on renal dialysis: Secondary | ICD-10-CM | POA: Diagnosis not present

## 2020-09-21 DIAGNOSIS — I5042 Chronic combined systolic (congestive) and diastolic (congestive) heart failure: Secondary | ICD-10-CM | POA: Diagnosis not present

## 2020-09-21 DIAGNOSIS — Z79899 Other long term (current) drug therapy: Secondary | ICD-10-CM | POA: Insufficient documentation

## 2020-09-21 LAB — BASIC METABOLIC PANEL
Anion gap: 9 (ref 5–15)
BUN: 24 mg/dL — ABNORMAL HIGH (ref 8–23)
CO2: 28 mmol/L (ref 22–32)
Calcium: 7.8 mg/dL — ABNORMAL LOW (ref 8.9–10.3)
Chloride: 92 mmol/L — ABNORMAL LOW (ref 98–111)
Creatinine, Ser: 3.76 mg/dL — ABNORMAL HIGH (ref 0.44–1.00)
GFR, Estimated: 12 mL/min — ABNORMAL LOW (ref >60)
Glucose, Bld: 183 mg/dL — ABNORMAL HIGH (ref 70–99)
Potassium: 3.9 mmol/L (ref 3.5–5.1)
Sodium: 129 mmol/L — ABNORMAL LOW (ref 135–145)

## 2020-09-21 LAB — CBG MONITORING, ED: Glucose-Capillary: 152 mg/dL — ABNORMAL HIGH (ref 70–99)

## 2020-09-21 LAB — CBC
HCT: 31.7 % — ABNORMAL LOW (ref 36.0–46.0)
Hemoglobin: 10.5 g/dL — ABNORMAL LOW (ref 12.0–15.0)
MCH: 35.7 pg — ABNORMAL HIGH (ref 26.0–34.0)
MCHC: 33.1 g/dL (ref 30.0–36.0)
MCV: 107.8 fL — ABNORMAL HIGH (ref 80.0–100.0)
Platelets: 182 10*3/uL (ref 150–400)
RBC: 2.94 MIL/uL — ABNORMAL LOW (ref 3.87–5.11)
RDW: 14.2 % (ref 11.5–15.5)
WBC: 4 10*3/uL (ref 4.0–10.5)
nRBC: 0 % (ref 0.0–0.2)

## 2020-09-21 LAB — RESP PANEL BY RT-PCR (FLU A&B, COVID) ARPGX2
Influenza A by PCR: NEGATIVE
Influenza B by PCR: NEGATIVE
SARS Coronavirus 2 by RT PCR: NEGATIVE

## 2020-09-21 LAB — TROPONIN I (HIGH SENSITIVITY): Troponin I (High Sensitivity): 13 ng/L (ref <18)

## 2020-09-21 MED ORDER — MECLIZINE HCL 25 MG PO TABS
12.5000 mg | ORAL_TABLET | Freq: Once | ORAL | Status: AC
  Administered 2020-09-21: 12.5 mg via ORAL
  Filled 2020-09-21: qty 1

## 2020-09-21 MED ORDER — SODIUM CHLORIDE 0.9 % IV BOLUS
500.0000 mL | Freq: Once | INTRAVENOUS | Status: AC
  Administered 2020-09-21: 500 mL via INTRAVENOUS

## 2020-09-21 NOTE — ED Notes (Signed)
Patient c/o pain at left shoulder and left rib cage. Dr. Cherylann Banas aware.

## 2020-09-21 NOTE — ED Notes (Signed)
Patient states weakness is to bilateral legs only.

## 2020-09-21 NOTE — ED Notes (Signed)
Patient's son states the patient does not need to void. Dr. Tamala Julian aware. Spanish interpreter called to clarify.

## 2020-09-21 NOTE — ED Provider Notes (Signed)
[facility] Emergency Department Provider Note ____________________________________________   Event Date/Time   First MD Initiated Contact with Patient 09/21/20 1159     (approximate)  I have reviewed the triage vital signs and the nursing notes.   HISTORY  Chief Complaint No chief complaint on file.  History of present illness and review of systems obtained via in-person Spanish interpreter  HPI Robin Rogers is a 80 y.o. female with PMH as noted below including CHF, ESRD on dialysis, diabetes, and hypertension who presents with generalized weakness over the last 3 days, gradual onset, persistent course, and associated with dizziness when she tries to stand up.  The patient states that yesterday when she got up she got very dizzy and felt like the room was spinning.  This caused her to fall and hit the left side of her head and upper body.  She reports some pain and bruising to this area but denies any other acute pain at this time.  She did not have LOC when she fell yesterday.  She denies associated chest pain, difficulty breathing, nausea or vomiting, urinary symptoms, or fever.   Past Medical History:  Diagnosis Date   Anemia of chronic disease    Blindness    Chronic combined systolic (congestive) and diastolic (congestive) heart failure ([place])    a. 09/2017 Echo: EF 45-50%, Gr2 DD; b. 05/2019 Echo: EF 40-45%, Gr III DD (restrictive), nl RV fxn, mod elev PASP. Mod dil LA. Mild MR.   CKD (chronic kidney disease), stage IV (HCC)    Diet-controlled diabetes mellitus ([place])    Hypertension     Patient Active Problem List   Diagnosis Date Noted   Goals of care, counseling/discussion    Palliative care by specialist    ESRD (end stage renal disease) ([place])    CKD (chronic kidney disease), stage V ([place])    Acute on chronic HFrEF (heart failure with reduced ejection fraction) (HCC)    Chronic anemia    CKD (chronic kidney disease), stage IV ([place])  05/31/2019   Insomnia 05/31/2019   Generalized weakness 05/31/2019   Anemia due to chronic kidney disease 05/31/2019   Blind in both eyes 05/31/2019   Hypertensive urgency 05/31/2019   Hyponatremia 05/30/2019   ARF (acute renal failure) ([place]) 05/10/2019   Acute on chronic heart failure with reduced ejection fraction and diastolic dysfunction ([place]) 05/10/2019   Chronic combined systolic and diastolic CHF (congestive heart failure) ([place]) 10/09/2017   HTN (hypertension) 10/09/2017   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 ([place]) 10/09/2017   Intractable nausea and vomiting 09/22/2017    Past Surgical History:  Procedure Laterality Date   AV FISTULA PLACEMENT Left 04/06/2020   Procedure: ARTERIOVENOUS (AV) FISTULA CREATION;  Surgeon: Katha Cabal, MD;  Location: [facility] ORS;  Service: Vascular;  Laterality: Left;   CHOLECYSTECTOMY     DIALYSIS/PERMA CATHETER INSERTION N/A 07/25/2019   Procedure: DIALYSIS/PERMA CATHETER INSERTION;  Surgeon: Algernon Huxley, MD;  Location: [place] [facility];  Service: Cardiovascular;  Laterality: N/A;   DIALYSIS/PERMA CATHETER INSERTION N/A 11/03/2019   Procedure: DIALYSIS/PERMA CATHETER INSERTION;  Surgeon: Algernon Huxley, MD;  Location: [place] [facility];  Service: Cardiovascular;  Laterality: N/A;   ESOPHAGOGASTRODUODENOSCOPY N/A 09/24/2017   Procedure: ESOPHAGOGASTRODUODENOSCOPY (EGD);  Surgeon: Toledo, Benay Pike, MD;  Location: [facility] ENDOSCOPY;  Service: Gastroenterology;  Laterality: N/A;    Prior to Admission medications   Medication Sig Start Date End Date Taking? Authorizing Provider  amLODipine (NORVASC) 10 MG tablet Take by mouth.    [provider]  aspirin EC 81 MG EC tablet Take 1 tablet (81 mg total) by mouth daily. 05/16/19   Swayze, Ava, DO  bacitracin ointment Apply to wound on left cheek twice per day 04/06/20   Schnier, Dolores Lory, MD  carboxymethylcellulose (REFRESH PLUS) 0.5 % SOLN Apply to eye.     [provider]  carvedilol (COREG) 6.25 MG tablet Take 1 tablet (6.25 mg total) by mouth 2 (two) times daily with a meal. 08/05/19   Loel Dubonnet, NP  ferrous sulfate 325 (65 FE) MG tablet Take 325 mg by mouth 2 (two) times daily with a meal.    [provider]  fluticasone (FLONASE) 50 MCG/ACT nasal spray  05/25/19   [provider]  furosemide (LASIX) 40 MG tablet Take 1 tablet (40 mg total) by mouth daily. 07/28/19   Lorella Nimrod, MD  hydrALAZINE (APRESOLINE) 10 MG tablet Take 1 tablet (10 mg total) by mouth 3 (three) times daily. 08/06/19   Loel Dubonnet, NP  HYDROcodone-acetaminophen (NORCO) 5-325 MG tablet Take 1-2 tablets by mouth every 6 (six) hours as needed for moderate pain or severe pain. 04/06/20   Schnier, Dolores Lory, MD  lactulose (Retsof) 10 GM/15ML solution TAKE 15MLS BY MOUTH THREE TIMES DAILY Patient not taking: Reported on 04/04/2020 05/16/19   [provider]  lovastatin (MEVACOR) 40 MG tablet Take 1 tablet by mouth at bedtime.  01/21/17   [provider]  melatonin 1 MG TABS tablet Take 2 mg by mouth at bedtime.    [provider]  metoprolol tartrate (LOPRESSOR) 25 MG tablet Take 25 mg by mouth 2 (two) times daily. 10/25/19   [provider]  omeprazole (PRILOSEC) 40 MG capsule Take 40 mg by mouth daily.    [provider]  ondansetron (ZOFRAN-ODT) 4 MG disintegrating tablet Take 4 mg by mouth every 8 (eight) hours as needed. 05/25/19   [provider]  polyethylene glycol powder (GLYCOLAX/MIRALAX) 17 GM/SCOOP powder Take by mouth. 09/25/17   [provider]  sevelamer carbonate (RENVELA) 800 MG tablet Take 800 mg by mouth 3 (three) times daily with meals.    [provider]  sodium bicarbonate 650 MG tablet Take 650 mg by mouth 2 (two) times daily.    [provider]  traZODone (DESYREL) 50 MG tablet Take 50 mg by mouth at bedtime. 05/25/19   [provider]   Vitamin D, Ergocalciferol, (DRISDOL) 1.25 MG (50000 UNIT) CAPS capsule Take 50,000 Units by mouth every 7 (seven) days. [facility])    [provider]    Allergies Patient has no known allergies.  No family history on file.  Social History Social History   Tobacco Use   Smoking status: Never   Smokeless tobacco: Never  Vaping Use   Vaping Use: Never used  Substance Use Topics   Alcohol use: No   Drug use: Never    Review of Systems  Constitutional: No fever/chills.  Positive for generalized weakness. Eyes: No redness. ENT: No sore throat. Cardiovascular: Denies chest pain. Respiratory: Denies shortness of breath. Gastrointestinal: No vomiting or diarrhea.  Genitourinary: Negative for dysuria.  Musculoskeletal: Negative for back pain. Skin: Negative for rash. Neurological: Negative for headaches, focal weakness or numbness.   ____________________________________________   PHYSICAL EXAM:  VITAL SIGNS: ED Triage Vitals  Enc Vitals Group     BP 09/21/20 1133 (!) 164/49     Pulse  Rate 09/21/20 1133 70     Resp 09/21/20 1133 16     Temp 09/21/20 1133 98.2 F (36.8 C)     Temp Source 09/21/20 1133 Oral     SpO2 09/21/20 1133 98 %     Weight 09/21/20 1124 159 lb 13.3 oz (72.5 kg)     Height 09/21/20 1124 '5\' 4"'$  (1.626 m)     Head Circumference --      Peak Flow --      Pain Score 09/21/20 1124 0     Pain Loc --      Pain Edu? --      Excl. in [place]? --     Constitutional: Alert and oriented.  Relatively well appearing and in no acute distress. 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 Head: Ecchymosis to left cheek and mandible area. Nose: No congestion/rhinnorhea. Mouth/Throat: Mucous membranes are moist.   Neck: Normal range of motion.  No midline cervical spinal tenderness. Cardiovascular: Normal rate, regular rhythm. Grossly normal heart sounds.  Good peripheral circulation. Respiratory:  Normal respiratory effort.  No retractions. Lungs CTAB. Gastrointestinal: Soft and nontender. No distention.  Genitourinary: No flank tenderness. Musculoskeletal: No lower extremity edema.  Extremities warm and well perfused.  Neurologic:  Normal speech and language.  Motor intact in all extremities.  Normal coordination. Skin:  Skin is warm and dry. No rash noted. Psychiatric: Mood and affect are normal. Speech and behavior are normal.  ____________________________________________   LABS (all labs ordered are listed, but only abnormal results are displayed)  Labs Reviewed  BASIC METABOLIC PANEL - Abnormal; Notable for the following components:      Result Value   Sodium 129 (*)    Chloride 92 (*)    Glucose, Bld 183 (*)    BUN 24 (*)    Creatinine, Ser 3.76 (*)    Calcium 7.8 (*)    GFR, Estimated 12 (*)    All other components within normal limits  CBC - Abnormal; Notable for the following components:   RBC 2.94 (*)    Hemoglobin 10.5 (*)    HCT 31.7 (*)    MCV 107.8 (*)    MCH 35.7 (*)    All other components within normal limits  CBG MONITORING, ED - Abnormal; Notable for the following components:   Glucose-Capillary 152 (*)    All other components within normal limits  RESP PANEL BY RT-PCR (FLU A&B, COVID) [id number]  URINALYSIS, COMPLETE (UACMP) WITH MICROSCOPIC  TROPONIN I (HIGH SENSITIVITY)   ____________________________________________  EKG  ED ECG REPORT I, Arta Silence, the attending physician, personally viewed and interpreted this ECG.  Date: 09/21/2020 EKG Time: 1141 Rate: 75 Rhythm: normal sinus rhythm QRS Axis: normal Intervals: normal ST/T Wave abnormalities: normal Narrative Interpretation: no evidence of acute ischemia  ____________________________________________  RADIOLOGY  CT head: Left superficial facial contusion with no ICH or other intracranial abnormality. CT cervical spine: No acute fracture XR L shoulder: No acute fracture XR  L ribs/chest: No acute fracture  ____________________________________________   PROCEDURES  Procedure(s) performed: No  Procedures  Critical Care performed: No ____________________________________________   INITIAL IMPRESSION / ASSESSMENT AND PLAN / ED COURSE  Pertinent labs & imaging results that were available during my care of the patient were reviewed by me and considered in my medical decision making (see chart for details).   80 year old female with PMH as noted above including ESRD on dialysis, hypertension, and DM presents with  generalized weakness over the last 3 days associated with vertigo type dizziness and a fall yesterday.  I reviewed the past medical records in [place].  The patient was last admitted in July after presenting with multiple falls, weakness, and decreased appetite.  At that time she had hyponatremia to 120, was uremic, and was started on dialysis.  She has been continue dialysis T/TH/S and had her last dialysis yesterday, which she states was uneventful.  On exam the patient is overall well-appearing.  Her vital signs are normal except for mild hypertension.  Neurologic exam is nonfocal.  She has ecchymosis to the left side of her face but no other traumatic findings.  Differential is broad but includes dehydration/hypovolemia, recurrent hyponatremia or other electrolyte abnormality, uremia or worsening ESRD (although I doubt this given she had dialysis yesterday), other metabolic cause, ACS or other cardiac etiology, UTI, COVID-19, or other infectious cause.  Do not suspect acute stroke given the lack of focal neurologic findings.  We will obtain lab work-up, CT of the head and cervical spine, x-rays to the left shoulder and ribs where she is reporting pain, give a small fluid bolus and meclizine for symptomatic treatment of the dizziness.  ----------------------------------------- 3:47 PM on 09/21/2020 -----------------------------------------  Imaging is  negative for acute findings.  Lab work-up is significant for hyponatremia.  We are still awaiting a urinalysis as well as trial of ambulation after meclizine.  I anticipate admission.  I have signed the patient out to the oncoming ED physician Dr. Tamala Julian.  ____________________________________________   FINAL CLINICAL IMPRESSION(S) / ED DIAGNOSES  Final diagnoses:  Dizziness  Generalized weakness  Hyponatremia      NEW MEDICATIONS STARTED DURING THIS VISIT:  New Prescriptions   No medications on file     Note:  This document was prepared using Dragon voice recognition software and may include unintentional dictation errors.    Arta Silence, MD 09/21/20 [phone]

## 2020-09-21 NOTE — ED Notes (Signed)
This nt was instructed to assist with ambulation of this pt. This NT went into room to assist pt, and pt's son stated he did not want her getting out of bed and walking. Education was given by RN about why this would be beneficial, pt's son refused.

## 2020-09-21 NOTE — ED Notes (Signed)
Patient is returning from CT scan.

## 2020-09-21 NOTE — ED Notes (Signed)
Interpreter paged. Son is at bedside, who states the patient is at her baseline for orientation.

## 2020-09-21 NOTE — ED Triage Notes (Signed)
[facility] from [facility] for ED evaluation for weakness. Per notes from clinic, patient has had a 3 day history of weakness, unable to walk and stand on her own.  Prior to this, patient able to walk in her home in the kitchen, living room and hallways independently.  Patient had a fall this morning and hit the side of her head, cheek and neck  Fall happened as she was going to dialysis.  Bruising seen to left cheek.  Denies LOC.  States patient fell when getting out of bed and legs were to weak to support patient's weight.  States hit head on the wall.  Patient denies any pain from fall. Dialysis on T-TH- Sat

## 2020-09-21 NOTE — ED Notes (Signed)
E signature pad not working 

## 2020-09-21 NOTE — ED Provider Notes (Signed)
Patient received in signout from Dr. Cherylann Banas pending ambulation trial after normal saline resuscitation for treatment of mild hyponatremia.  I reassessed the patient, accompanied by Spanish interpreter, she reports feeling better.  I assisted her to a standing position and she reports minimal dizziness and feeling much better than presentation.  She takes a few steps and ambulates around the room, and son reports that she looks better.  They expressed comfort going home and patient's son is requesting a work note.  We will discharge with return precautions.   Vladimir Crofts, MD 09/21/20 [phone]

## 2020-09-21 NOTE — ED Notes (Signed)
Patient is at imaging.

## 2020-09-21 NOTE — ED Notes (Signed)
Patient ambulated with steady gait with assist due to visual impairment. Patient usually walks with a cane.

## 2020-09-21 NOTE — ED Notes (Signed)
Patient denies need to void at this  time.

## 2020-11-14 NOTE — Progress Notes (Signed)
MRN : [id number]  Robin Rogers is a 80 y.o. (04-10-1940) female who presents with chief complaint of check access.  History of Present Illness:   The patient returns to the office for follow up regarding problem with the dialysis access. Currently the patient is maintained via a left brachial axillary AV graft 04/06/2020.    The patient notes they are having a very hard time with cannulation.  The patient has also been informed that there is increased recirculation.    The patient denies hand pain or other symptoms consistent with steal phenomena.  No significant arm swelling.  The patient denies redness or swelling at the access site. The patient denies fever or chills at home or while on dialysis.      No outpatient medications have been marked as taking for the 11/15/20 encounter (Appointment) with Delana Meyer, Dolores Lory, MD.    Past Medical History:  Diagnosis Date   Anemia of chronic disease    Blindness    Chronic combined systolic (congestive) and diastolic (congestive) heart failure ([place])    a. 09/2017 Echo: EF 45-50%, Gr2 DD; b. 05/2019 Echo: EF 40-45%, Gr III DD (restrictive), nl RV fxn, mod elev PASP. Mod dil LA. Mild MR.   CKD (chronic kidney disease), stage IV (HCC)    Diet-controlled diabetes mellitus ([place])    Hypertension     Past Surgical History:  Procedure Laterality Date   AV FISTULA PLACEMENT Left 04/06/2020   Procedure: ARTERIOVENOUS (AV) FISTULA CREATION;  Surgeon: Katha Cabal, MD;  Location: [facility] ORS;  Service: Vascular;  Laterality: Left;   CHOLECYSTECTOMY     DIALYSIS/PERMA CATHETER INSERTION N/A 07/25/2019   Procedure: DIALYSIS/PERMA CATHETER INSERTION;  Surgeon: Algernon Huxley, MD;  Location: [place] [facility];  Service: Cardiovascular;  Laterality: N/A;   DIALYSIS/PERMA CATHETER INSERTION N/A 11/03/2019   Procedure: DIALYSIS/PERMA CATHETER INSERTION;  Surgeon: Algernon Huxley, MD;  Location: [place] [facility];  Service: Cardiovascular;  Laterality:  N/A;   ESOPHAGOGASTRODUODENOSCOPY N/A 09/24/2017   Procedure: ESOPHAGOGASTRODUODENOSCOPY (EGD);  Surgeon: Toledo, Benay Pike, MD;  Location: [facility];  Service: Gastroenterology;  Laterality: N/A;    Social History Social History   Tobacco Use   Smoking status: Never   Smokeless tobacco: Never  Vaping Use   Vaping Use: Never used  Substance Use Topics   Alcohol use: No   Drug use: Never    Family History No family history on file.  No Known Allergies   REVIEW OF SYSTEMS (Negative unless checked)  Constitutional: [] Weight loss  [] Fever  [] Chills Cardiac: [] Chest pain   [] Chest pressure   [] Palpitations   [] Shortness of breath when laying flat   [] Shortness of breath with exertion. Vascular:  [] Pain in legs with walking   [] Pain in legs at rest  [] History of DVT   [] Phlebitis   [] Swelling in legs   [] Varicose veins   [] Non-healing ulcers Pulmonary:   [] Uses home oxygen   [] Productive cough   [] Hemoptysis   [] Wheeze  [] COPD   [] Asthma Neurologic:  [] Dizziness   [] Seizures   [] History of stroke   [] History of TIA  [] Aphasia   [] Vissual changes   [] Weakness or numbness in arm   [] Weakness or numbness in leg Musculoskeletal:   [] Joint swelling   [] Joint pain   [] Low back pain Hematologic:  [] Easy bruising  [] Easy bleeding   [] Hypercoagulable state   [] Anemic Gastrointestinal:  [] Diarrhea   [] Vomiting  [] Gastroesophageal reflux/heartburn   [] Difficulty swallowing. Genitourinary:  [  x]Chronic kidney disease   [] Difficult urination  [] Frequent urination   [] Blood in urine Skin:  [] Rashes   [] Ulcers  Psychological:  [] History of anxiety   []  History of major depression.  Physical Examination  There were no vitals filed for this visit. There is no height or weight on file to calculate BMI. Gen: WD/WN, NAD Head: [place]/AT, No temporalis wasting.  Ear/Nose/Throat: Hearing grossly intact, nares w/o erythema or drainage Eyes: PER, EOMI, sclera nonicteric.  Neck: Supple, no gross masses  or lesions.  No JVD.  Pulmonary:  Good air movement, no audible wheezing, no use of accessory muscles.  Cardiac: RRR, precordium non-hyperdynamic. Vascular:   left brachial axillary AV graft with poor bruit and poor thrill.  Skin looks healthy Vessel Right Left  Radial Palpable Palpable  Brachial Palpable Palpable  Gastrointestinal: soft, non-distended. No guarding/no peritoneal signs.  Musculoskeletal: M/S 5/5 throughout.  No deformity.  Neurologic: CN 2-12 intact. Pain and light touch intact in extremities.  Symmetrical.  Speech is fluent. Motor exam as listed above. Psychiatric: Judgment intact, Mood & affect appropriate for pt's clinical situation. Dermatologic: No rashes or ulcers noted.  No changes consistent with cellulitis.   CBC Lab Results  Component Value Date   WBC 4.0 09/21/2020   HGB 10.5 (L) 09/21/2020   HCT 31.7 (L) 09/21/2020   MCV 107.8 (H) 09/21/2020   PLT 182 09/21/2020    BMET    Component Value Date/Time   NA 129 (L) 09/21/2020 1137   NA 140 05/19/2013 0757   K 3.9 09/21/2020 1137   K 5.2 (H) 05/19/2013 0757   CL 92 (L) 09/21/2020 1137   CL 111 (H) 05/19/2013 0757   CO2 28 09/21/2020 1137   CO2 22 05/19/2013 0757   GLUCOSE 183 (H) 09/21/2020 1137   GLUCOSE 109 (H) 05/19/2013 0757   BUN 24 (H) 09/21/2020 1137   BUN 35 (H) 05/19/2013 0757   CREATININE 3.76 (H) 09/21/2020 1137   CREATININE 1.57 (H) 05/19/2013 0757   CALCIUM 7.8 (L) 09/21/2020 1137   CALCIUM 7.8 (L) 05/15/2019 0456   GFRNONAA 12 (L) 09/21/2020 1137   GFRNONAA 33 (L) 05/19/2013 0757   GFRAA 18 (L) 07/27/2019 0540   GFRAA 38 (L) 05/19/2013 0757   CrCl cannot be calculated (Patient's most recent lab result is older than the maximum 21 days allowed.).  COAG Lab Results  Component Value Date   INR 1.1 04/04/2020    Radiology No results found.   Assessment/Plan 1. ESRD (end stage renal disease) ([place]) Recommend:  The patient is experiencing increasing problems with their  dialysis access.  Patient should have a fistulagram with the intention for intervention.  The intention for intervention is to restore appropriate flow and prevent thrombosis and possible loss of the access.  As well as improve the quality of dialysis therapy.  The risks, benefits and alternative therapies were reviewed in detail with the patient.  All questions were answered.  The patient agrees to proceed with angio/intervention.      2. Primary hypertension Continue antihypertensive medications as already ordered, these medications have been reviewed and there are no changes at this time.   3. Type 2 diabetes mellitus with stage 4 chronic kidney disease, without long-term current use of insulin (HCC) Continue hypoglycemic medications as already ordered, these medications have been reviewed and there are no changes at this time.  Hgb A1C to be monitored as already arranged by primary service   4. Chronic combined systolic and diastolic  CHF (congestive heart failure) (HCC) Continue cardiac and antihypertensive medications as already ordered and reviewed, no changes at this time.  Continue statin as ordered and reviewed, no changes at this time  Nitrates PRN for chest pain     [name], MD  11/14/2020 1:39 PM

## 2020-11-15 ENCOUNTER — Ambulatory Visit (INDEPENDENT_AMBULATORY_CARE_PROVIDER_SITE_OTHER): Payer: Medicare Other | Admitting: Vascular Surgery

## 2020-11-15 ENCOUNTER — Other Ambulatory Visit: Payer: Self-pay

## 2020-11-15 VITALS — BP 158/74 | HR 90 | Ht <= 58 in | Wt 145.0 lb

## 2020-11-15 DIAGNOSIS — N186 End stage renal disease: Secondary | ICD-10-CM | POA: Diagnosis not present

## 2020-11-15 DIAGNOSIS — I10 Essential (primary) hypertension: Secondary | ICD-10-CM | POA: Diagnosis not present

## 2020-11-15 DIAGNOSIS — I5042 Chronic combined systolic (congestive) and diastolic (congestive) heart failure: Secondary | ICD-10-CM | POA: Diagnosis not present

## 2020-11-15 DIAGNOSIS — N184 Chronic kidney disease, stage 4 (severe): Secondary | ICD-10-CM

## 2020-11-15 DIAGNOSIS — E1122 Type 2 diabetes mellitus with diabetic chronic kidney disease: Secondary | ICD-10-CM

## 2020-11-18 ENCOUNTER — Encounter (INDEPENDENT_AMBULATORY_CARE_PROVIDER_SITE_OTHER): Payer: Self-pay | Admitting: Vascular Surgery

## 2020-11-19 ENCOUNTER — Telehealth (INDEPENDENT_AMBULATORY_CARE_PROVIDER_SITE_OTHER): Payer: Self-pay

## 2020-11-19 NOTE — Telephone Encounter (Signed)
Spoke with the patient's daughter [name] and the patient is scheduled with Dr. Delana Meyer for a left arm fistulagram on 12/04/20 with a 9:00 am arrival time to the [facility]. Pre-procedure instructions were discussed and will be mailed.

## 2020-12-03 ENCOUNTER — Telehealth (INDEPENDENT_AMBULATORY_CARE_PROVIDER_SITE_OTHER): Payer: Self-pay

## 2020-12-03 NOTE — Telephone Encounter (Signed)
Patient's daughter called to reschedule the angio on 12/04/20 with Dr. Delana Meyer. Patient was rescheduled to 12/18/20 with a  11:00 am arrival time to the [facility]. Pre-procedure instructions will be mailed.

## 2020-12-04 DIAGNOSIS — N186 End stage renal disease: Secondary | ICD-10-CM

## 2020-12-12 ENCOUNTER — Telehealth (INDEPENDENT_AMBULATORY_CARE_PROVIDER_SITE_OTHER): Payer: Self-pay

## 2020-12-12 NOTE — Telephone Encounter (Signed)
Patient daughter was made aware with new arrival time to be 1 pm for her schedule procedure on 12/18/20 with Dr Delana Meyer.

## 2020-12-17 NOTE — Telephone Encounter (Signed)
Patient's daughter called to cancel the patient's angio due to the patient having a cough. Daughter stated she would call back to reschedule when she is better. Patient has been canceled.

## 2020-12-18 ENCOUNTER — Encounter: Admission: RE | Payer: Self-pay | Source: Ambulatory Visit

## 2020-12-18 ENCOUNTER — Ambulatory Visit: Admission: RE | Admit: 2020-12-18 | Payer: Medicare Other | Source: Ambulatory Visit | Admitting: Vascular Surgery

## 2020-12-18 SURGERY — A/V FISTULAGRAM
Anesthesia: Moderate Sedation | Laterality: Left

## 2020-12-19 NOTE — Telephone Encounter (Signed)
I received a fax from the patient's nephrologist on 12/18/20 to have the patient get in for a permcath exchange and to check the access. Patient was on the schedule for 12/18/20 and they called on 12/17/20 to cancel the appt stating the patient had a cough. I attempted to contact the patient and a message was left for a return call.

## 2021-05-21 ENCOUNTER — Other Ambulatory Visit (INDEPENDENT_AMBULATORY_CARE_PROVIDER_SITE_OTHER): Payer: Self-pay | Admitting: Nurse Practitioner

## 2021-05-21 DIAGNOSIS — N186 End stage renal disease: Secondary | ICD-10-CM

## 2021-05-23 ENCOUNTER — Ambulatory Visit (INDEPENDENT_AMBULATORY_CARE_PROVIDER_SITE_OTHER): Payer: Medicare Other | Admitting: Vascular Surgery

## 2021-05-23 ENCOUNTER — Encounter (INDEPENDENT_AMBULATORY_CARE_PROVIDER_SITE_OTHER): Payer: Medicare Other

## 2021-06-27 ENCOUNTER — Ambulatory Visit (INDEPENDENT_AMBULATORY_CARE_PROVIDER_SITE_OTHER): Payer: Medicare Other

## 2021-06-27 ENCOUNTER — Encounter (INDEPENDENT_AMBULATORY_CARE_PROVIDER_SITE_OTHER): Payer: Self-pay | Admitting: Vascular Surgery

## 2021-06-27 ENCOUNTER — Ambulatory Visit (INDEPENDENT_AMBULATORY_CARE_PROVIDER_SITE_OTHER): Payer: Medicare Other | Admitting: Vascular Surgery

## 2021-06-27 VITALS — BP 103/60 | HR 83 | Resp 17 | Ht <= 58 in | Wt 162.0 lb

## 2021-06-27 DIAGNOSIS — N184 Chronic kidney disease, stage 4 (severe): Secondary | ICD-10-CM

## 2021-06-27 DIAGNOSIS — I10 Essential (primary) hypertension: Secondary | ICD-10-CM

## 2021-06-27 DIAGNOSIS — I5043 Acute on chronic combined systolic (congestive) and diastolic (congestive) heart failure: Secondary | ICD-10-CM | POA: Diagnosis not present

## 2021-06-27 DIAGNOSIS — N186 End stage renal disease: Secondary | ICD-10-CM

## 2021-06-27 DIAGNOSIS — E1122 Type 2 diabetes mellitus with diabetic chronic kidney disease: Secondary | ICD-10-CM

## 2021-06-27 NOTE — Progress Notes (Incomplete)
MRN : [id number]  Robin Rogers is a 81 y.o. (08-17-1940) female who presents with chief complaint of check access.  History of Present Illness:   The patient returns to the office for follow up regarding problem with the dialysis access. Currently the patient is maintained via a left brachial axillary AV graft 04/06/2020.     The patient notes they are having a very hard time with cannulation.  The patient has also been informed that there is increased recirculation.    The patient denies hand pain or other symptoms consistent with steal phenomena.  No significant arm swelling.   The patient denies redness or swelling at the access site. The patient denies fever or chills at home or while on dialysis.    Current Meds  Medication Sig   amLODipine (NORVASC) 10 MG tablet Take by mouth.   aspirin EC 81 MG EC tablet Take 1 tablet (81 mg total) by mouth daily.   carvedilol (COREG) 6.25 MG tablet Take 1 tablet (6.25 mg total) by mouth 2 (two) times daily with a meal.   ferrous sulfate 325 (65 FE) MG tablet Take 325 mg by mouth 2 (two) times daily with a meal.   furosemide (LASIX) 40 MG tablet Take 1 tablet (40 mg total) by mouth daily.   lovastatin (MEVACOR) 40 MG tablet Take 1 tablet by mouth at bedtime.    metoprolol tartrate (LOPRESSOR) 25 MG tablet Take 25 mg by mouth 2 (two) times daily.   omeprazole (PRILOSEC) 40 MG capsule Take 40 mg by mouth daily.   sevelamer carbonate (RENVELA) 800 MG tablet Take 800 mg by mouth 3 (three) times daily with meals.   sodium bicarbonate 650 MG tablet Take 650 mg by mouth 2 (two) times daily.   Vitamin D, Ergocalciferol, (DRISDOL) 1.25 MG (50000 UNIT) CAPS capsule Take 50,000 Units by mouth every 7 (seven) days. Cyril Loosen)    Past Medical History:  Diagnosis Date   Anemia of chronic disease    Blindness    Chronic combined systolic (congestive) and diastolic (congestive) heart failure ([place])    a. 09/2017 Echo: EF 45-50%, Gr2 DD; b.  05/2019 Echo: EF 40-45%, Gr III DD (restrictive), nl RV fxn, mod elev PASP. Mod dil LA. Mild MR.   CKD (chronic kidney disease), stage IV (HCC)    Diet-controlled diabetes mellitus ([place])    Hypertension     Past Surgical History:  Procedure Laterality Date   AV FISTULA PLACEMENT Left 04/06/2020   Procedure: ARTERIOVENOUS (AV) FISTULA CREATION;  Surgeon: Katha Cabal, MD;  Location: [facility] ORS;  Service: Vascular;  Laterality: Left;   CHOLECYSTECTOMY     DIALYSIS/PERMA CATHETER INSERTION N/A 07/25/2019   Procedure: DIALYSIS/PERMA CATHETER INSERTION;  Surgeon: Algernon Huxley, MD;  Location: [place] [facility];  Service: Cardiovascular;  Laterality: N/A;   DIALYSIS/PERMA CATHETER INSERTION N/A 11/03/2019   Procedure: DIALYSIS/PERMA CATHETER INSERTION;  Surgeon: Algernon Huxley, MD;  Location: [place] [facility];  Service: Cardiovascular;  Laterality: N/A;   ESOPHAGOGASTRODUODENOSCOPY N/A 09/24/2017   Procedure: ESOPHAGOGASTRODUODENOSCOPY (EGD);  Surgeon: Toledo, Benay Pike, MD;  Location: [facility] END[facility];  Service: Gastroenterology;  Laterality: N/A;    Social History Social History   Tobacco Use   Smoking status: Never   Smokeless tobacco: Never  Vaping Use   Vaping Use: Never used  Substance Use Topics   Alcohol use: No   Drug use: Never  Family History No family history on file.  No Known Allergies   REVIEW OF SYSTEMS (Negative unless checked)  Constitutional: [] Weight loss  [] Fever  [] Chills Cardiac: [] Chest pain   [] Chest pressure   [] Palpitations   [] Shortness of breath when laying flat   [] Shortness of breath with exertion. Vascular:  [] Pain in legs with walking   [] Pain in legs at rest  [] History of DVT   [] Phlebitis   [] Swelling in legs   [] Varicose veins   [] Non-healing ulcers Pulmonary:   [] Uses home oxygen   [] Productive cough   [] Hemoptysis   [] Wheeze  [] COPD   [] Asthma Neurologic:  [] Dizziness   [] Seizures   [] History of stroke   [] History of TIA  [] Aphasia    [] Vissual changes   [] Weakness or numbness in arm   [] Weakness or numbness in leg Musculoskeletal:   [] Joint swelling   [] Joint pain   [] Low back pain Hematologic:  [] Easy bruising  [] Easy bleeding   [] Hypercoagulable state   [] Anemic Gastrointestinal:  [] Diarrhea   [] Vomiting  [] Gastroesophageal reflux/heartburn   [] Difficulty swallowing. Genitourinary:  [x] Chronic kidney disease   [] Difficult urination  [] Frequent urination   [] Blood in urine Skin:  [] Rashes   [] Ulcers  Psychological:  [] History of anxiety   []  History of major depression.  Physical Examination  Vitals:   06/27/21 1527  BP: 103/60  Pulse: 83  Resp: 17  Weight: 162 lb (73.5 kg)  Height: 4\' 9"  (1.448 m)   Body mass index is 35.06 kg/m. Gen: WD/WN, NAD Head: [place]/AT, No temporalis wasting.  Ear/Nose/Throat: Hearing grossly intact, nares w/o erythema or drainage Eyes: PER, EOMI, sclera nonicteric.  Neck: Supple, no gross masses or lesions.  No JVD.  Pulmonary:  Good air movement, no audible wheezing, no use of accessory muscles.  Cardiac: RRR, precordium non-hyperdynamic. Vascular:   *** Vessel Right Left  Radial Palpable Palpable  Brachial Palpable Palpable  Gastrointestinal: soft, non-distended. No guarding/no peritoneal signs.  Musculoskeletal: M/S 5/5 throughout.  No deformity.  Neurologic: CN 2-12 intact. Pain and light touch intact in extremities.  Symmetrical.  Speech is fluent. Motor exam as listed above. Psychiatric: Judgment intact, Mood & affect appropriate for pt's clinical situation. Dermatologic: No rashes or ulcers noted.  No changes consistent with cellulitis.   CBC Lab Results  Component Value Date   WBC 4.0 09/21/2020   HGB 10.5 (L) 09/21/2020   HCT 31.7 (L) 09/21/2020   MCV 107.8 (H) 09/21/2020   PLT 182 09/21/2020    BMET    Component Value Date/Time   NA 129 (L) 09/21/2020 1137   NA 140 05/19/2013 0757   K 3.9 09/21/2020 1137   K 5.2 (H) 05/19/2013 0757   CL 92 (L) 09/21/2020  1137   CL 111 (H) 05/19/2013 0757   CO2 28 09/21/2020 1137   CO2 22 05/19/2013 0757   GLUCOSE 183 (H) 09/21/2020 1137   GLUCOSE 109 (H) 05/19/2013 0757   BUN 24 (H) 09/21/2020 1137   BUN 35 (H) 05/19/2013 0757   CREATININE 3.76 (H) 09/21/2020 1137   CREATININE 1.57 (H) 05/19/2013 0757   CALCIUM 7.8 (L) 09/21/2020 1137   CALCIUM 7.8 (L) 05/15/2019 0456   GFRNONAA 12 (L) 09/21/2020 1137   GFRNONAA 33 (L) 05/19/2013 0757   GFRAA 18 (L) 07/27/2019 0540   GFRAA 38 (L) 05/19/2013 0757   CrCl cannot be calculated (Patient's most recent lab result is older than the maximum 21 days allowed.).  COAG Lab Results  Component Value Date  INR 1.1 04/04/2020    Radiology No results found.   Assessment/Plan There are no diagnoses linked to this encounter.   [name], MD  06/27/2021 3:43 PM

## 2021-06-30 ENCOUNTER — Encounter (INDEPENDENT_AMBULATORY_CARE_PROVIDER_SITE_OTHER): Payer: Self-pay | Admitting: Vascular Surgery

## 2021-08-01 ENCOUNTER — Telehealth (INDEPENDENT_AMBULATORY_CARE_PROVIDER_SITE_OTHER): Payer: Self-pay | Admitting: Vascular Surgery

## 2021-08-01 NOTE — Telephone Encounter (Signed)
Patients daughter called in stating that she never got a call to schedule fistulagram. Is wanting to be called ASAP to get scheduled if possible     Please call and advise

## 2021-08-05 NOTE — Telephone Encounter (Signed)
Spoke with the patient's daughter and she is scheduled with Dr. Delana Meyer for a left arm fistulagram on 08/06/21 with a 2:15 pm arrival time to the MM. Pre-procedure instructions were discussed and patient's daughter stated she understood.

## 2021-08-06 ENCOUNTER — Ambulatory Visit: Admit: 2021-08-06 | Payer: Medicare Other | Admitting: Vascular Surgery

## 2021-08-06 ENCOUNTER — Encounter: Payer: Self-pay | Admitting: Emergency Medicine

## 2021-08-06 ENCOUNTER — Encounter
Admission: RE | Disposition: A | Discharge status: Home / Self Care | Payer: Self-pay | Source: Ambulatory Visit | Attending: Vascular Surgery

## 2021-08-06 ENCOUNTER — Ambulatory Visit: Admission: RE | Admit: 2021-08-06 | Payer: Medicare Other | Source: Ambulatory Visit | Admitting: Vascular Surgery

## 2021-08-06 ENCOUNTER — Encounter: Payer: Self-pay | Admitting: Vascular Surgery

## 2021-08-06 ENCOUNTER — Ambulatory Visit (HOSPITAL_BASED_OUTPATIENT_CLINIC_OR_DEPARTMENT_OTHER)
Admission: RE | Admit: 2021-08-06 | Discharge: 2021-08-06 | Disposition: A | Discharge status: Home / Self Care | Payer: Medicare Other | Source: Ambulatory Visit | Attending: Vascular Surgery | Admitting: Vascular Surgery

## 2021-08-06 ENCOUNTER — Other Ambulatory Visit: Payer: Self-pay

## 2021-08-06 ENCOUNTER — Encounter: Admission: RE | Payer: Self-pay | Source: Ambulatory Visit

## 2021-08-06 ENCOUNTER — Emergency Department
Admission: EM | Admit: 2021-08-06 | Discharge: 2021-08-06 | Disposition: A | Discharge status: Home / Self Care | Payer: Medicare Other | Attending: Emergency Medicine | Admitting: Emergency Medicine

## 2021-08-06 DIAGNOSIS — E1122 Type 2 diabetes mellitus with diabetic chronic kidney disease: Secondary | ICD-10-CM | POA: Insufficient documentation

## 2021-08-06 DIAGNOSIS — T82858A Stenosis of vascular prosthetic devices, implants and grafts, initial encounter: Secondary | ICD-10-CM

## 2021-08-06 DIAGNOSIS — I509 Heart failure, unspecified: Secondary | ICD-10-CM

## 2021-08-06 DIAGNOSIS — T82898A Other specified complication of vascular prosthetic devices, implants and grafts, initial encounter: Secondary | ICD-10-CM | POA: Insufficient documentation

## 2021-08-06 DIAGNOSIS — R112 Nausea with vomiting, unspecified: Secondary | ICD-10-CM

## 2021-08-06 DIAGNOSIS — I132 Hypertensive heart and chronic kidney disease with heart failure and with stage 5 chronic kidney disease, or end stage renal disease: Secondary | ICD-10-CM | POA: Insufficient documentation

## 2021-08-06 DIAGNOSIS — S51811A Laceration without foreign body of right forearm, initial encounter: Secondary | ICD-10-CM | POA: Insufficient documentation

## 2021-08-06 DIAGNOSIS — Z992 Dependence on renal dialysis: Secondary | ICD-10-CM | POA: Insufficient documentation

## 2021-08-06 DIAGNOSIS — I129 Hypertensive chronic kidney disease with stage 1 through stage 4 chronic kidney disease, or unspecified chronic kidney disease: Secondary | ICD-10-CM | POA: Insufficient documentation

## 2021-08-06 DIAGNOSIS — Y841 Kidney dialysis as the cause of abnormal reaction of the patient, or of later complication, without mention of misadventure at the time of the procedure: Secondary | ICD-10-CM | POA: Insufficient documentation

## 2021-08-06 DIAGNOSIS — I120 Hypertensive chronic kidney disease with stage 5 chronic kidney disease or end stage renal disease: Secondary | ICD-10-CM

## 2021-08-06 DIAGNOSIS — N186 End stage renal disease: Secondary | ICD-10-CM | POA: Insufficient documentation

## 2021-08-06 DIAGNOSIS — I5042 Chronic combined systolic (congestive) and diastolic (congestive) heart failure: Secondary | ICD-10-CM | POA: Insufficient documentation

## 2021-08-06 DIAGNOSIS — S51819A Laceration without foreign body of unspecified forearm, initial encounter: Secondary | ICD-10-CM

## 2021-08-06 DIAGNOSIS — S59911A Unspecified injury of right forearm, initial encounter: Secondary | ICD-10-CM | POA: Diagnosis present

## 2021-08-06 DIAGNOSIS — N189 Chronic kidney disease, unspecified: Secondary | ICD-10-CM | POA: Insufficient documentation

## 2021-08-06 DIAGNOSIS — W268XXA Contact with other sharp object(s), not elsewhere classified, initial encounter: Secondary | ICD-10-CM | POA: Diagnosis not present

## 2021-08-06 HISTORY — PX: A/V FISTULAGRAM: CATH118298

## 2021-08-06 LAB — POTASSIUM (ARMC VASCULAR LAB ONLY): Potassium (ARMC vascular lab): 5.4 mmol/L — ABNORMAL HIGH (ref 3.5–5.1)

## 2021-08-06 SURGERY — A/V FISTULAGRAM
Anesthesia: Moderate Sedation | Laterality: Left

## 2021-08-06 SURGERY — A/V FISTULAGRAM
Anesthesia: Moderate Sedation

## 2021-08-06 MED ORDER — DIPHENHYDRAMINE HCL 50 MG/ML IJ SOLN: 50.0000 mg | Freq: Once | INTRAMUSCULAR | Status: DC | PRN

## 2021-08-06 MED ORDER — FENTANYL CITRATE (PF) 100 MCG/2ML IJ SOLN
INTRAMUSCULAR | Status: DC | PRN
  Administered 2021-08-06 (×2): 25 ug via INTRAVENOUS

## 2021-08-06 MED ORDER — CEFAZOLIN SODIUM-DEXTROSE 1-4 GM/50ML-% IV SOLN
1.0000 g | INTRAVENOUS | Status: AC
  Administered 2021-08-06: 1 g via INTRAVENOUS

## 2021-08-06 MED ORDER — HEPARIN SODIUM (PORCINE) 1000 UNIT/ML IJ SOLN
INTRAMUSCULAR | Status: AC
  Filled 2021-08-06: qty 10

## 2021-08-06 MED ORDER — MIDAZOLAM HCL 2 MG/2ML IJ SOLN
INTRAMUSCULAR | Status: AC
  Filled 2021-08-06: qty 4

## 2021-08-06 MED ORDER — HYDROMORPHONE HCL 1 MG/ML IJ SOLN: 1.0000 mg | Freq: Once | INTRAMUSCULAR | Status: AC | PRN

## 2021-08-06 MED ORDER — ONDANSETRON HCL 4 MG/2ML IJ SOLN: 4.0000 mg | Freq: Four times a day (QID) | INTRAMUSCULAR | Status: AC | PRN

## 2021-08-06 MED ORDER — HYDROMORPHONE HCL 1 MG/ML IJ SOLN: 1.0000 mg | Freq: Once | INTRAMUSCULAR | Status: DC | PRN

## 2021-08-06 MED ORDER — SODIUM CHLORIDE 0.9 % IV SOLN: INTRAVENOUS | Status: AC

## 2021-08-06 MED ORDER — HYDROCODONE-ACETAMINOPHEN 5-325 MG PO TABS
1.0000 | ORAL_TABLET | Freq: Once | ORAL | Status: AC
  Administered 2021-08-06: 1 via ORAL
  Filled 2021-08-06: qty 1

## 2021-08-06 MED ORDER — LIDOCAINE-EPINEPHRINE-TETRACAINE (LET) SOLUTION
3.0000 mL | Freq: Once | NASAL | Status: AC
  Administered 2021-08-06: 3 mL via TOPICAL
  Filled 2021-08-06: qty 3

## 2021-08-06 MED ORDER — CEFAZOLIN SODIUM-DEXTROSE 2-4 GM/100ML-% IV SOLN: 2.0000 g | INTRAVENOUS | Status: AC

## 2021-08-06 MED ORDER — SODIUM CHLORIDE 0.9 % IV SOLN
INTRAVENOUS | Status: DC
Start: 2021-08-06 — End: 2021-08-06

## 2021-08-06 MED ORDER — METHYLPREDNISOLONE SODIUM SUCC 125 MG IJ SOLR: 125.0000 mg | Freq: Once | INTRAMUSCULAR | Status: DC | PRN

## 2021-08-06 MED ORDER — FAMOTIDINE 20 MG PO TABS: 40.0000 mg | ORAL_TABLET | Freq: Once | ORAL | Status: DC | PRN

## 2021-08-06 MED ORDER — DIPHENHYDRAMINE HCL 50 MG/ML IJ SOLN: 50.0000 mg | Freq: Once | INTRAMUSCULAR | Status: AC | PRN

## 2021-08-06 MED ORDER — MIDAZOLAM HCL 2 MG/2ML IJ SOLN
INTRAMUSCULAR | Status: DC | PRN
  Administered 2021-08-06: 1 mg via INTRAVENOUS
  Administered 2021-08-06: .5 mg via INTRAVENOUS

## 2021-08-06 MED ORDER — FAMOTIDINE 20 MG PO TABS: 40.0000 mg | ORAL_TABLET | Freq: Once | ORAL | Status: AC | PRN

## 2021-08-06 MED ORDER — FENTANYL CITRATE PF 50 MCG/ML IJ SOSY
PREFILLED_SYRINGE | INTRAMUSCULAR | Status: AC
  Filled 2021-08-06: qty 2

## 2021-08-06 MED ORDER — HYDROCODONE-ACETAMINOPHEN 5-325 MG PO TABS: 1.0000 | ORAL_TABLET | Freq: Three times a day (TID) | ORAL | 0 refills | Status: AC | PRN

## 2021-08-06 MED ORDER — HYDROCODONE-ACETAMINOPHEN 5-325 MG PO TABS: 1.0000 | ORAL_TABLET | Freq: Three times a day (TID) | ORAL | 0 refills | Status: DC | PRN

## 2021-08-06 MED ORDER — METHYLPREDNISOLONE SODIUM SUCC 125 MG IJ SOLR: 125.0000 mg | Freq: Once | INTRAMUSCULAR | Status: AC | PRN

## 2021-08-06 MED ORDER — MIDAZOLAM HCL 2 MG/ML PO SYRP: 8.0000 mg | ORAL_SOLUTION | Freq: Once | ORAL | Status: DC | PRN

## 2021-08-06 MED ORDER — ONDANSETRON HCL 4 MG/2ML IJ SOLN: 4.0000 mg | Freq: Four times a day (QID) | INTRAMUSCULAR | Status: DC | PRN

## 2021-08-06 MED ORDER — IODIXANOL 320 MG/ML IV SOLN
INTRAVENOUS | Status: DC | PRN
  Administered 2021-08-06: 25 mL via INTRA_ARTERIAL

## 2021-08-06 MED ORDER — MIDAZOLAM HCL 2 MG/ML PO SYRP: 8.0000 mg | ORAL_SOLUTION | Freq: Once | ORAL | Status: AC | PRN

## 2021-08-06 SURGICAL SUPPLY — 8 items
COVER PROBE U/S 5X48 (MISCELLANEOUS) ×1 IMPLANT
DRAPE BRACHIAL (DRAPES) ×1 IMPLANT
NDL ENTRY 21GA 7CM ECHOTIP (NEEDLE) IMPLANT
NEEDLE ENTRY 21GA 7CM ECHOTIP (NEEDLE) ×2 IMPLANT
PACK ANGIOGRAPHY (CUSTOM PROCEDURE TRAY) ×2 IMPLANT
SET INTRO CAPELLA COAXIAL (SET/KITS/TRAYS/PACK) ×1 IMPLANT
SHEATH BRITE TIP 6FRX5.5 (SHEATH) ×1 IMPLANT
SUT MNCRL AB 4-0 PS2 18 (SUTURE) ×1 IMPLANT

## 2021-08-06 NOTE — Interval H&P Note (Signed)
History and Physical Interval Note:  08/06/2021 3:50 PM  Robin Rogers  has presented today for surgery, with the diagnosis of L Fistulagram  End Stage Renal.  The various methods of treatment have been discussed with the patient and family. After consideration of risks, benefits and other options for treatment, the patient has consented to  Procedure(s): A/V Fistulagram (Left) as a surgical intervention.  The patient's history has been reviewed, patient examined, no change in status, stable for surgery.  I have reviewed the patient's chart and labs.  Questions were answered to the patient's satisfaction.     [name]

## 2021-08-06 NOTE — Op Note (Signed)
OPERATIVE NOTE   PROCEDURE: Contrast injection left arm brachiocephalic fistula  PRE-OPERATIVE DIAGNOSIS: Complication of dialysis access                                                       End Stage Renal Disease  POST-OPERATIVE DIAGNOSIS: same as above   SURGEON: Katha Cabal, M.D.  ANESTHESIA: Conscious sedation was administered under my direct supervision by the interventional radiology RN.  IV Versed plus fentanyl were utilized. Continuous ECG, pulse oximetry and blood pressure was monitored throughout the entire procedure.  Conscious sedation was for a total of 33 minutes.  ESTIMATED BLOOD LOSS: minimal  FINDING(S): There are 3 primary issues first the the vein is very nice in size but it is deep in the area that would need accessing and is tortuous.  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 female who  presents with malfunctioning left arm AV access.  The patient is scheduled for angiography with possible intervention of the AV access to prevent loss of the permanent access.  The patient is aware the risks include but are not limited to: bleeding, infection, thrombosis of the cannulated access, and possible anaphylactic reaction to the contrast.  The patient acknowledges if the access can not be salvaged a tunneled catheter will be needed and will be placed during this procedure.  The patient is aware of the risks of the procedure and elects to proceed with the angiogram and intervention.  DESCRIPTION: After full informed written consent was obtained, the patient was brought back to the Special Procedure suite and placed supine position.  Appropriate cardiopulmonary monitors were placed.  The left arm was  prepped and draped in the standard fashion.  Appropriate timeout is called.   The left arm access was cannulated with a micropuncture needle under ultrasound guidence.  Ultrasound was used to evaluate the left brachiocephalic fistula.  It was echolucent and compressible indicating it is patent .  An ultrasound image was acquired for the permanent record.  A micropuncture needle was used to access the brachiocephalic access under direct ultrasound guidance.  The microwire was then advanced under fluoroscopic guidance without difficulty followed by the micro-sheath.  The J-wire was then advanced and a 6 Fr sheath inserted.  Hand injections were completed to image the access from the arterial anastomosis through the entire access.  The central venous structures were also imaged by hand injections.  Based on the images, no intervention is performed.  There are 3 primary issues first the the vein is very nice in size but it is deep in the area that would need accessing and is tortuous.  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  The sheath was removed and light pressure was applied.  A sterile bandage was applied to the puncture site.    COMPLICATIONS: None  CONDITION:  Unchanged  Katha Cabal, M.D [facility] Office: [phone]  08/06/2021 4:27 PM

## 2021-08-06 NOTE — Telephone Encounter (Signed)
Patient's daughter called back upset because she thought the patient was to be at the hospital at 10:15 am. I stated that I said 2:15 pm and she became irate and hung up.

## 2021-08-06 NOTE — Progress Notes (Signed)
MRN : [id number]  Robin Rogers is a 81 y.o. (1940-12-12) female who presents with chief complaint of check access.  History of Present Illness:   Patient presents to [facility] for treatment of her left arm AV access.  She was seen in the office last month.  Currently the patient is maintained via a left brachial axillary AV graft 04/06/2020.     The patient notes they are having a very hard time with cannulation.  The patient has also been informed that there is increased recirculation.    The patient denies hand pain or other symptoms consistent with steal phenomena.  No significant arm swelling.   The patient denies redness or swelling at the access site. The patient denies fever or chills at home or while on dialysis.   Duplex ultrasound of the left upper arm access shows low volume flow of 756 cc/min this is a significant decrease compared to her last study   No outpatient medications have been marked as taking for the 08/06/21 encounter [facility] Encounter).    Past Medical History:  Diagnosis Date   Anemia of chronic disease    Blindness    Chronic combined systolic (congestive) and diastolic (congestive) heart failure ([place])    a. 09/2017 Echo: EF 45-50%, Gr2 DD; b. 05/2019 Echo: EF 40-45%, Gr III DD (restrictive), nl RV fxn, mod elev PASP. Mod dil LA. Mild MR.   CKD (chronic kidney disease), stage IV (HCC)    Diet-controlled diabetes mellitus ([place])    Hypertension     Past Surgical History:  Procedure Laterality Date   AV FISTULA PLACEMENT Left 04/06/2020   Procedure: ARTERIOVENOUS (AV) FISTULA CREATION;  Surgeon: Katha Cabal, MD;  Location: [facility] ORS;  Service: Vascular;  Laterality: Left;   CHOLECYSTECTOMY     DIALYSIS/PERMA CATHETER INSERTION N/A 07/25/2019   Procedure: DIALYSIS/PERMA CATHETER INSERTION;  Surgeon: Algernon Huxley, MD;  Location: [place] [facility];  Service: Cardiovascular;  Laterality: N/A;    DIALYSIS/PERMA CATHETER INSERTION N/A 11/03/2019   Procedure: DIALYSIS/PERMA CATHETER INSERTION;  Surgeon: Algernon Huxley, MD;  Location: [place] [facility];  Service: Cardiovascular;  Laterality: N/A;   ESOPHAGOGASTRODUODENOSCOPY N/A 09/24/2017   Procedure: ESOPHAGOGASTRODUODENOSCOPY (EGD);  Surgeon: Toledo, Benay Pike, MD;  Location: [facility] ENDOSCOPY;  Service: Gastroenterology;  Laterality: N/A;    Social History Social History   Tobacco Use   Smoking status: Never   Smokeless tobacco: Never  Vaping Use   Vaping Use: Never used  Substance Use Topics   Alcohol use: No   Drug use: Never    Family History History reviewed. No pertinent family history.  No Known Allergies   REVIEW OF SYSTEMS (Negative unless checked)  Constitutional: [] Weight loss  [] Fever  [] Chills Cardiac: [] Chest pain   [] Chest pressure   [] Palpitations   [] Shortness of breath when laying flat   [] Shortness of breath with exertion. Vascular:  [] Pain in legs with walking   [] Pain in legs at rest  [] History of DVT   [] Phlebitis   [] Swelling in legs   [] Varicose veins   [] Non-healing ulcers Pulmonary:   [] Uses home oxygen   [] Productive cough   [] Hemoptysis   [] Wheeze  [] COPD   [] Asthma Neurologic:  [] Dizziness   [] Seizures   [] History of stroke   [] History of TIA  [] Aphasia   [] Vissual changes   []   Weakness or numbness in arm   [] Weakness or numbness in leg Musculoskeletal:   [] Joint swelling   [] Joint pain   [] Low back pain Hematologic:  [] Easy bruising  [] Easy bleeding   [] Hypercoagulable state   [] Anemic Gastrointestinal:  [] Diarrhea   [] Vomiting  [] Gastroesophageal reflux/heartburn   [] Difficulty swallowing. Genitourinary:  [x] Chronic kidney disease   [] Difficult urination  [] Frequent urination   [] Blood in urine Skin:  [] Rashes   [] Ulcers  Psychological:  [] History of anxiety   []  History of major depression.  Physical Examination  Vitals:   08/06/21 1515  BP: (!) 164/58  Pulse: 61  Resp: 14  Temp: 98 F  (36.7 C)  TempSrc: Oral  SpO2: 97%   There is no height or weight on file to calculate BMI. Gen: WD/WN, NAD Head: [place]/AT, No temporalis wasting.  Ear/Nose/Throat: Hearing grossly intact, nares w/o erythema or drainage Eyes: PER, EOMI, sclera nonicteric.  Neck: Supple, no gross masses or lesions.  No JVD.  Pulmonary:  Good air movement, no audible wheezing, no use of accessory muscles.  Cardiac: RRR, precordium non-hyperdynamic. Vascular:   Left arm AV graft with weak thrill and poor bruit Vessel Right Left  Radial Palpable Palpable  Brachial Palpable Palpable  Gastrointestinal: soft, non-distended. No guarding/no peritoneal signs.  Musculoskeletal: M/S 5/5 throughout.  No deformity.  Neurologic: CN 2-12 intact. Pain and light touch intact in extremities.  Symmetrical.  Speech is fluent. Motor exam as listed above. Psychiatric: Judgment intact, Mood & affect appropriate for pt's clinical situation. Dermatologic: No rashes or ulcers noted.  No changes consistent with cellulitis.   CBC Lab Results  Component Value Date   WBC 4.0 09/21/2020   HGB 10.5 (L) 09/21/2020   HCT 31.7 (L) 09/21/2020   MCV 107.8 (H) 09/21/2020   PLT 182 09/21/2020    BMET    Component Value Date/Time   NA 129 (L) 09/21/2020 1137   NA 140 05/19/2013 0757   K 3.9 09/21/2020 1137   K 5.2 (H) 05/19/2013 0757   CL 92 (L) 09/21/2020 1137   CL 111 (H) 05/19/2013 0757   CO2 28 09/21/2020 1137   CO2 22 05/19/2013 0757   GLUCOSE 183 (H) 09/21/2020 1137   GLUCOSE 109 (H) 05/19/2013 0757   BUN 24 (H) 09/21/2020 1137   BUN 35 (H) 05/19/2013 0757   CREATININE 3.76 (H) 09/21/2020 1137   CREATININE 1.57 (H) 05/19/2013 0757   CALCIUM 7.8 (L) 09/21/2020 1137   CALCIUM 7.8 (L) 05/15/2019 0456   GFRNONAA 12 (L) 09/21/2020 1137   GFRNONAA 33 (L) 05/19/2013 0757   GFRAA 18 (L) 07/27/2019 0540   GFRAA 38 (L) 05/19/2013 0757   CrCl cannot be calculated (Patient's most recent lab result is older than the maximum  21 days allowed.).  COAG Lab Results  Component Value Date   INR 1.1 04/04/2020    Radiology No results found.   Assessment/Plan 1.  Complication dialysis device with poor flow in her AV access:  Patient's left arm dialysis access is failing. The patient will undergo angiography using interventional techniques. Potassium will be drawn to ensure that it is an appropriate level prior to performing thrombectomy. 2.  End-stage renal disease requiring hemodialysis:  Patient will continue dialysis therapy without further interruption if a successful intervention is not achieved then catheter will be placed. Dialysis has already been arranged since the patient missed their previous session 3.  Hypertension:  Patient will continue medical management; nephrology is following no changes in oral medications.  4. Diabetes mellitus:  Glucose will be monitored and oral medications been held this morning once the patient has undergone the patient's procedure po intake will be reinitiated and again Accu-Cheks will be used to assess the blood glucose level and treat as needed. The patient will be restarted on the patient's usual hypoglycemic regime 5.  Congestive heart failure:  EKG will be monitored. Nitrates will be used if needed. The patient's oral cardiac medications will be continued.     [name], MD  08/06/2021 3:46 PM

## 2021-08-06 NOTE — ED Provider Notes (Signed)
[facility] Emergency Department Provider Note     Event Date/Time   First MD Initiated Contact with Patient 08/06/21 1130     (approximate)   History   Laceration   HPI  History limited by Spanish [name]) language.  Adult daughter is present at bedside for HPI.  Robin Rogers is a 81 y.o. female with a history of hypertension, CKD on dialysis TThSa) presents to the ED for evaluation of a right arm laceration.  Patient presents with a skin tear to her right forearm.  She bumped into something while at dialysis, causing a local injury.  She presents with arm wrapped in gauze.  Patient's dialysis port is on the opposite arm.  Denies any other injury at this time.  Physical Exam   Triage Vital Signs: ED Triage Vitals  Enc Vitals Group     BP 08/06/21 1037 (!) 175/53     Pulse Rate 08/06/21 1037 69     Resp 08/06/21 1037 17     Temp 08/06/21 1037 98.2 F (36.8 C)     Temp Source 08/06/21 1037 Oral     SpO2 08/06/21 1037 95 %     Weight 08/06/21 1104 170 lb (77.1 kg)     Height 08/06/21 1104 4\' 11"  (1.499 m)     Head Circumference --      Peak Flow --      Pain Score 08/06/21 1103 10     Pain Loc --      Pain Edu? --      Excl. in [place]? --     Most recent vital signs: Vitals:   08/06/21 1037  BP: (!) 175/53  Pulse: 69  Resp: 17  Temp: 98.2 F (36.8 C)  SpO2: 95%    General Awake, no distress.  CV:  Good peripheral perfusion.  RESP:  Normal effort.  ABD:  No distention.  SKIN:   ED Results / Procedures / Treatments   Labs (all labs ordered are listed, but only abnormal results are displayed) Labs Reviewed - No data to display   EKG   RADIOLOGY    No results found.   PROCEDURES:  Critical Care performed: No  ..Laceration Repair  Date/Time: 08/06/2021 12:13 PM  Performed by: Claybon Jabs, [place] Authorized by: Melvenia Needles, PA-C   Consent:    Consent obtained:  Verbal   Consent given by:   Patient   Risks, benefits, and alternatives were discussed: yes     Risks discussed:  Poor wound healing   Alternatives discussed:  No treatment Universal protocol:    Site/side marked: yes     Patient identity confirmed:  Verbally with patient Anesthesia:    Anesthesia method:  Topical application   Topical anesthetic:  LET Laceration details:    Location:  Shoulder/arm   Shoulder/arm location:  R lower arm   Length (cm):  4   Depth (mm):  5 Pre-procedure details:    Preparation:  Patient was prepped and draped in usual sterile fashion Exploration:    Limited defect created (wound extended): no     Hemostasis achieved with:  LET   Contaminated: no   Treatment:    Area cleansed with:  Saline   Amount of cleaning:  Standard   Irrigation solution:  Sterile saline   Visualized foreign bodies/material removed: no     Debridement:  None   Undermining:  None   Scar revision: no   Skin repair:  Repair method:  Tissue adhesive Approximation:    Approximation:  Close Repair type:    Repair type:  Simple Post-procedure details:    Dressing:  Non-adherent dressing   Procedure completion:  Tolerated well, no immediate complications    MEDICATIONS ORDERED IN ED: Medications  HYDROcodone-acetaminophen (NORCO/VICODIN) 5-325 MG per tablet 1 tablet (has no administration in time range)  lidocaine-EPINEPHrine-tetracaine (LET) solution (3 mLs Topical Given 08/06/21 1218)     IMPRESSION / MDM / ASSESSMENT AND PLAN / ED COURSE  I reviewed the triage vital signs and the nursing notes.                              Differential diagnosis includes, but is not limited to, skin tear, skin laceration, abrasion, contusion  Patient's presentation is most consistent with acute, uncomplicated illness.   Patient's diagnosis is consistent with skin tear. Patient will be discharged home with wound care instructions and supplies. Patient is to follow up with her primary provider as needed or  otherwise directed. Patient is given ED precautions to return to the ED for any worsening or new symptoms.     FINAL CLINICAL IMPRESSION(S) / ED DIAGNOSES   Final diagnoses:  Skin tear of forearm without complication, initial encounter     Rx / DC Orders   ED Discharge Orders          Ordered    HYDROcodone-acetaminophen (NORCO) 5-325 MG tablet  3 times daily PRN        08/06/21 1243             Note:  This document was prepared using Dragon voice recognition software and may include unintentional dictation errors.    Melvenia Needles, PA-C 08/06/21 1244    Duffy Bruce, MD 08/07/21 [phone]

## 2021-08-06 NOTE — ED Triage Notes (Signed)
Was at dialysis and got a cut on right forearm.  Has bandage in place.  The shunt is on opposite arm.  Looks like a skin tear and bruise.

## 2021-08-06 NOTE — Discharge Instructions (Signed)
Keep the wound clean, dry, and covered.  Follow-up with primary provider for ongoing symptoms.  Take the pain medicine as needed.

## 2021-08-06 NOTE — H&P (View-Only) (Signed)
MRN : [id number]  Robin Rogers is a 81 y.o. (Nov 30, 1940) female who presents with chief complaint of check access.  History of Present Illness:   Patient presents to [facility] for treatment of her left arm AV access.  She was seen in the office last month.  Currently the patient is maintained via a left brachial axillary AV graft 04/06/2020.     The patient notes they are having a very hard time with cannulation.  The patient has also been informed that there is increased recirculation.    The patient denies hand pain or other symptoms consistent with steal phenomena.  No significant arm swelling.   The patient denies redness or swelling at the access site. The patient denies fever or chills at home or while on dialysis.   Duplex ultrasound of the left upper arm access shows low volume flow of 756 cc/min this is a significant decrease compared to her last study   No outpatient medications have been marked as taking for the 08/06/21 encounter [facility] Encounter).    Past Medical History:  Diagnosis Date   Anemia of chronic disease    Blindness    Chronic combined systolic (congestive) and diastolic (congestive) heart failure ([place])    a. 09/2017 Echo: EF 45-50%, Gr2 DD; b. 05/2019 Echo: EF 40-45%, Gr III DD (restrictive), nl RV fxn, mod elev PASP. Mod dil LA. Mild MR.   CKD (chronic kidney disease), stage IV (HCC)    Diet-controlled diabetes mellitus ([place])    Hypertension     Past Surgical History:  Procedure Laterality Date   AV FISTULA PLACEMENT Left 04/06/2020   Procedure: ARTERIOVENOUS (AV) FISTULA CREATION;  Surgeon: Katha Cabal, MD;  Location: [facility] ORS;  Service: Vascular;  Laterality: Left;   CHOLECYSTECTOMY     DIALYSIS/PERMA CATHETER INSERTION N/A 07/25/2019   Procedure: DIALYSIS/PERMA CATHETER INSERTION;  Surgeon: Algernon Huxley, MD;  Location: [place] [facility];  Service: Cardiovascular;  Laterality: N/A;    DIALYSIS/PERMA CATHETER INSERTION N/A 11/03/2019   Procedure: DIALYSIS/PERMA CATHETER INSERTION;  Surgeon: Algernon Huxley, MD;  Location: [place] [facility];  Service: Cardiovascular;  Laterality: N/A;   ESOPHAGOGASTRODUODENOSCOPY N/A 09/24/2017   Procedure: ESOPHAGOGASTRODUODENOSCOPY (EGD);  Surgeon: Toledo, Benay Pike, MD;  Location: [facility] ENDOSCOPY;  Service: Gastroenterology;  Laterality: N/A;    Social History Social History   Tobacco Use   Smoking status: Never   Smokeless tobacco: Never  Vaping Use   Vaping Use: Never used  Substance Use Topics   Alcohol use: No   Drug use: Never    Family History History reviewed. No pertinent family history.  No Known Allergies   REVIEW OF SYSTEMS (Negative unless checked)  Constitutional: [] Weight loss  [] Fever  [] Chills Cardiac: [] Chest pain   [] Chest pressure   [] Palpitations   [] Shortness of breath when laying flat   [] Shortness of breath with exertion. Vascular:  [] Pain in legs with walking   [] Pain in legs at rest  [] History of DVT   [] Phlebitis   [] Swelling in legs   [] Varicose veins   [] Non-healing ulcers Pulmonary:   [] Uses home oxygen   [] Productive cough   [] Hemoptysis   [] Wheeze  [] COPD   [] Asthma Neurologic:  [] Dizziness   [] Seizures   [] History of stroke   [] History of TIA  [] Aphasia   [] Vissual changes   []   Weakness or numbness in arm   [] Weakness or numbness in leg Musculoskeletal:   [] Joint swelling   [] Joint pain   [] Low back pain Hematologic:  [] Easy bruising  [] Easy bleeding   [] Hypercoagulable state   [] Anemic Gastrointestinal:  [] Diarrhea   [] Vomiting  [] Gastroesophageal reflux/heartburn   [] Difficulty swallowing. Genitourinary:  [x] Chronic kidney disease   [] Difficult urination  [] Frequent urination   [] Blood in urine Skin:  [] Rashes   [] Ulcers  Psychological:  [] History of anxiety   []  History of major depression.  Physical Examination  Vitals:   08/06/21 1515  BP: (!) 164/58  Pulse: 61  Resp: 14  Temp: 98 F  (36.7 C)  TempSrc: Oral  SpO2: 97%   There is no height or weight on file to calculate BMI. Gen: WD/WN, NAD Head: [place]/AT, No temporalis wasting.  Ear/Nose/Throat: Hearing grossly intact, nares w/o erythema or drainage Eyes: PER, EOMI, sclera nonicteric.  Neck: Supple, no gross masses or lesions.  No JVD.  Pulmonary:  Good air movement, no audible wheezing, no use of accessory muscles.  Cardiac: RRR, precordium non-hyperdynamic. Vascular:   Left arm AV graft with weak thrill and poor bruit Vessel Right Left  Radial Palpable Palpable  Brachial Palpable Palpable  Gastrointestinal: soft, non-distended. No guarding/no peritoneal signs.  Musculoskeletal: M/S 5/5 throughout.  No deformity.  Neurologic: CN 2-12 intact. Pain and light touch intact in extremities.  Symmetrical.  Speech is fluent. Motor exam as listed above. Psychiatric: Judgment intact, Mood & affect appropriate for pt's clinical situation. Dermatologic: No rashes or ulcers noted.  No changes consistent with cellulitis.   CBC Lab Results  Component Value Date   WBC 4.0 09/21/2020   HGB 10.5 (L) 09/21/2020   HCT 31.7 (L) 09/21/2020   MCV 107.8 (H) 09/21/2020   PLT 182 09/21/2020    BMET    Component Value Date/Time   NA 129 (L) 09/21/2020 1137   NA 140 05/19/2013 0757   K 3.9 09/21/2020 1137   K 5.2 (H) 05/19/2013 0757   CL 92 (L) 09/21/2020 1137   CL 111 (H) 05/19/2013 0757   CO2 28 09/21/2020 1137   CO2 22 05/19/2013 0757   GLUCOSE 183 (H) 09/21/2020 1137   GLUCOSE 109 (H) 05/19/2013 0757   BUN 24 (H) 09/21/2020 1137   BUN 35 (H) 05/19/2013 0757   CREATININE 3.76 (H) 09/21/2020 1137   CREATININE 1.57 (H) 05/19/2013 0757   CALCIUM 7.8 (L) 09/21/2020 1137   CALCIUM 7.8 (L) 05/15/2019 0456   GFRNONAA 12 (L) 09/21/2020 1137   GFRNONAA 33 (L) 05/19/2013 0757   GFRAA 18 (L) 07/27/2019 0540   GFRAA 38 (L) 05/19/2013 0757   CrCl cannot be calculated (Patient's most recent lab result is older than the maximum  21 days allowed.).  COAG Lab Results  Component Value Date   INR 1.1 04/04/2020    Radiology No results found.   Assessment/Plan 1.  Complication dialysis device with poor flow in her AV access:  Patient's left arm dialysis access is failing. The patient will undergo angiography using interventional techniques. Potassium will be drawn to ensure that it is an appropriate level prior to performing thrombectomy. 2.  End-stage renal disease requiring hemodialysis:  Patient will continue dialysis therapy without further interruption if a successful intervention is not achieved then catheter will be placed. Dialysis has already been arranged since the patient missed their previous session 3.  Hypertension:  Patient will continue medical management; nephrology is following no changes in oral medications.  4. Diabetes mellitus:  Glucose will be monitored and oral medications been held this morning once the patient has undergone the patient's procedure po intake will be reinitiated and again Accu-Cheks will be used to assess the blood glucose level and treat as needed. The patient will be restarted on the patient's usual hypoglycemic regime 5.  Congestive heart failure:  EKG will be monitored. Nitrates will be used if needed. The patient's oral cardiac medications will be continued.     [name], MD  08/06/2021 3:46 PM

## 2021-08-06 NOTE — ED Triage Notes (Signed)
Pt here with a right arm laceration. Pt states she bumped into something and it began to tear her skin off. Pt's right forearm is wrapped in gauze.

## 2021-08-07 ENCOUNTER — Encounter: Payer: Self-pay | Admitting: Vascular Surgery

## 2021-08-29 ENCOUNTER — Ambulatory Visit (INDEPENDENT_AMBULATORY_CARE_PROVIDER_SITE_OTHER): Payer: Medicare Other | Admitting: Vascular Surgery

## 2021-09-02 ENCOUNTER — Ambulatory Visit (INDEPENDENT_AMBULATORY_CARE_PROVIDER_SITE_OTHER): Payer: Medicare Other | Admitting: Vascular Surgery

## 2021-09-02 ENCOUNTER — Encounter (INDEPENDENT_AMBULATORY_CARE_PROVIDER_SITE_OTHER): Payer: Self-pay | Admitting: Vascular Surgery

## 2021-09-02 VITALS — BP 186/67 | HR 73 | Resp 16 | Wt 166.4 lb

## 2021-09-02 DIAGNOSIS — N184 Chronic kidney disease, stage 4 (severe): Secondary | ICD-10-CM

## 2021-09-02 DIAGNOSIS — N186 End stage renal disease: Secondary | ICD-10-CM

## 2021-09-02 DIAGNOSIS — I10 Essential (primary) hypertension: Secondary | ICD-10-CM

## 2021-09-02 DIAGNOSIS — E1122 Type 2 diabetes mellitus with diabetic chronic kidney disease: Secondary | ICD-10-CM

## 2021-09-02 NOTE — Progress Notes (Signed)
MRN : [id number]  Robin Rogers is a 81 y.o. (Apr 04, 1940) female who presents with chief complaint of check access.  History of Present Illness:   The patient returns to the office for followup status post angiogram of the dialysis access 08/06/2021:  There are 3 primary issues; first the the vein is very nice in size but it is deep in the area that would need accessing and is tortuous.  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 The patient denies an increase in arm swelling. At the present time the patient denies hand pain.  No recent shortening of the patient's walking distance or new symptoms consistent with claudication.  No history of rest pain symptoms. No new ulcers or wounds of the lower extremities have occurred.  The patient denies amaurosis fugax or recent TIA symptoms. There are no recent neurological changes noted. There is no history of DVT, PE or superficial thrombophlebitis. No recent episodes of angina or shortness of breath documented.    No outpatient medications have been marked as taking for the 09/02/21 encounter (Appointment) with Delana Meyer, Dolores Lory, MD.    Past Medical History:  Diagnosis Date   Anemia of chronic disease    Blindness    Chronic combined systolic (congestive) and diastolic (congestive) heart failure ([place])    a. 09/2017 Echo: EF 45-50%, Gr2 DD; b. 05/2019 Echo: EF 40-45%, Gr III DD (restrictive), nl RV fxn, mod elev PASP. Mod dil LA. Mild MR.   CKD (chronic kidney disease), stage IV (HCC)    Diet-controlled diabetes mellitus ([place])    Hypertension     Past Surgical History:  Procedure Laterality Date   A/V FISTULAGRAM Left 08/06/2021   Procedure: A/V Fistulagram;  Surgeon: Katha Cabal, MD;   Location: [place] [facility];  Service: Cardiovascular;  Laterality: Left;   AV FISTULA PLACEMENT Left 04/06/2020   Procedure: ARTERIOVENOUS (AV) FISTULA CREATION;  Surgeon: Katha Cabal, MD;  Location: [facility] ORS;  Service: Vascular;  Laterality: Left;   CHOLECYSTECTOMY     DIALYSIS/PERMA CATHETER INSERTION N/A 07/25/2019   Procedure: DIALYSIS/PERMA CATHETER INSERTION;  Surgeon: Algernon Huxley, MD;  Location: [place] [facility];  Service: Cardiovascular;  Laterality: N/A;   DIALYSIS/PERMA CATHETER INSERTION N/A 11/03/2019   Procedure: DIALYSIS/PERMA CATHETER INSERTION;  Surgeon: Algernon Huxley, MD;  Location: [place] [facility];  Service: Cardiovascular;  Laterality: N/A;   ESOPHAGOGASTRODUODENOSCOPY N/A 09/24/2017   Procedure: ESOPHAGOGASTRODUODENOSCOPY (EGD);  Surgeon: Toledo, Benay Pike, MD;  Location: [facility] ENDOSCOPY;  Service: Gastroenterology;  Laterality: N/A;    Social History Social History   Tobacco Use   Smoking status: Never   Smokeless tobacco: Never  Vaping Use   Vaping Use: Never used  Substance Use Topics   Alcohol use: No   Drug use: Never    Family History No family history on file.  No Known Allergies   REVIEW OF SYSTEMS (Negative unless checked)  Constitutional: [] Weight loss  [] Fever  [] Chills Cardiac: [] Chest pain   [] Chest pressure   [] Palpitations   [] Shortness of breath when laying flat   [] Shortness of breath with exertion. Vascular:  [] Pain in legs with walking   []   Pain in legs at rest  [] History of DVT   [] Phlebitis   [] Swelling in legs   [] Varicose veins   [] Non-healing ulcers Pulmonary:   [] Uses home oxygen   [] Productive cough   [] Hemoptysis   [] Wheeze  [] COPD   [] Asthma Neurologic:  [] Dizziness   [] Seizures   [] History of stroke   [] History of TIA  [] Aphasia   [] Vissual changes   [] Weakness or numbness in arm   [] Weakness or numbness in leg Musculoskeletal:   [] Joint swelling   [] Joint pain   [] Low back pain Hematologic:  [] Easy bruising  [] Easy  bleeding   [] Hypercoagulable state   [] Anemic Gastrointestinal:  [] Diarrhea   [] Vomiting  [] Gastroesophageal reflux/heartburn   [] Difficulty swallowing. Genitourinary:  [x] Chronic kidney disease   [] Difficult urination  [] Frequent urination   [] Blood in urine Skin:  [] Rashes   [] Ulcers  Psychological:  [] History of anxiety   []  History of major depression.  Physical Examination  There were no vitals filed for this visit. There is no height or weight on file to calculate BMI. Gen: WD/WN, NAD Head: [place]/AT, No temporalis wasting.  Ear/Nose/Throat: Hearing grossly intact, nares w/o erythema or drainage Eyes: PER, EOMI, sclera nonicteric.  Neck: Supple, no gross masses or lesions.  No JVD.  Pulmonary:  Good air movement, no audible wheezing, no use of accessory muscles.  Cardiac: RRR, precordium non-hyperdynamic. Vascular:   AV fistula with good thrill good bruit in the antecubital fossa Vessel Right Left  Radial Palpable Palpable  Brachial Palpable Palpable  Gastrointestinal: soft, non-distended. No guarding/no peritoneal signs.  Musculoskeletal: M/S 5/5 throughout.  No deformity.  Neurologic: CN 2-12 intact. Pain and light touch intact in extremities.  Symmetrical.  Speech is fluent. Motor exam as listed above. Psychiatric: Judgment intact, Mood & affect appropriate for pt's clinical situation. Dermatologic: No rashes or ulcers noted.  No changes consistent with cellulitis.   CBC Lab Results  Component Value Date   WBC 4.0 09/21/2020   HGB 10.5 (L) 09/21/2020   HCT 31.7 (L) 09/21/2020   MCV 107.8 (H) 09/21/2020   PLT 182 09/21/2020    BMET    Component Value Date/Time   NA 129 (L) 09/21/2020 1137   NA 140 05/19/2013 0757   K 3.9 09/21/2020 1137   K 5.2 (H) 05/19/2013 0757   CL 92 (L) 09/21/2020 1137   CL 111 (H) 05/19/2013 0757   CO2 28 09/21/2020 1137   CO2 22 05/19/2013 0757   GLUCOSE 183 (H) 09/21/2020 1137   GLUCOSE 109 (H) 05/19/2013 0757   BUN 24 (H) 09/21/2020  1137   BUN 35 (H) 05/19/2013 0757   CREATININE 3.76 (H) 09/21/2020 1137   CREATININE 1.57 (H) 05/19/2013 0757   CALCIUM 7.8 (L) 09/21/2020 1137   CALCIUM 7.8 (L) 05/15/2019 0456   GFRNONAA 12 (L) 09/21/2020 1137   GFRNONAA 33 (L) 05/19/2013 0757   GFRAA 18 (L) 07/27/2019 0540   GFRAA 38 (L) 05/19/2013 0757   CrCl cannot be calculated (Patient's most recent lab result is older than the maximum 21 days allowed.).  COAG Lab Results  Component Value Date   INR 1.1 04/04/2020    Radiology PERIPHERAL VASCULAR CATHETERIZATION  Result Date: 08/06/2021 See surgical note for result.    Assessment/Plan 1. ESRD (end stage renal disease) ([place]) Recommend:  The patient is experiencing increasing problems with their dialysis access. There are 3 primary issues; first the the vein is very nice in size but it is deep in the area that  would need accessing and is tortuous.  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  Patient should have a fistulagram with the intention for intervention.  The intention for intervention is to restore appropriate flow proximally and allow for superficialization as a separate surgery.  This will improve the quality of dialysis therapy and allow easier access of her AV fistula.  This will also entail moving the left IJ catheter to the right side or the femoral position which was also discussed with the patient and her granddaughter  The risks, benefits and alternative therapies were reviewed in detail with the patient.  All questions were answered.  The patient agrees to proceed with angio/intervention.    The patient will follow up with me in the office after the procedure.    2. Primary hypertension Continue antihypertensive medications as already ordered, these medications have been reviewed and there are no  changes at this time.   3. Type 2 diabetes mellitus with stage 4 chronic kidney disease, without long-term current use of insulin (HCC) Continue hypoglycemic medications as already ordered, these medications have been reviewed and there are no changes at this time.  Hgb A1C to be monitored as already arranged by primary service     [name], MD  09/02/2021 1:32 PM

## 2021-09-03 ENCOUNTER — Telehealth (INDEPENDENT_AMBULATORY_CARE_PROVIDER_SITE_OTHER): Payer: Self-pay

## 2021-09-03 NOTE — Telephone Encounter (Signed)
Called and spoke with the patient's daughter regarding getting scheduled for a left arm angioplasty and permcath exchange with Dr. Delana Meyer. Patient is scheduled on 09/10/21 with a 9:15 am arrival time to the [facility]. Pre-procedure instructions were discussed and will be mailed to the patient's daughter as requested.

## 2021-09-10 ENCOUNTER — Ambulatory Visit: Admission: RE | Admit: 2021-09-10 | Payer: Medicare Other | Source: Home / Self Care | Admitting: Vascular Surgery

## 2021-09-10 ENCOUNTER — Encounter (INDEPENDENT_AMBULATORY_CARE_PROVIDER_SITE_OTHER): Payer: Self-pay | Admitting: Vascular Surgery

## 2021-09-10 ENCOUNTER — Encounter: Admission: RE | Payer: Self-pay | Source: Home / Self Care

## 2021-09-10 DIAGNOSIS — N186 End stage renal disease: Secondary | ICD-10-CM

## 2021-09-10 SURGERY — A/V SHUNT INTERVENTION
Anesthesia: Moderate Sedation

## 2021-09-27 ENCOUNTER — Encounter: Payer: Self-pay | Admitting: Emergency Medicine

## 2021-09-27 ENCOUNTER — Emergency Department: Payer: Medicare Other

## 2021-09-27 ENCOUNTER — Emergency Department
Admission: EM | Admit: 2021-09-27 | Discharge: 2021-09-27 | Disposition: A | Discharge status: Home / Self Care | Payer: Medicare Other | Attending: Emergency Medicine | Admitting: Emergency Medicine

## 2021-09-27 ENCOUNTER — Other Ambulatory Visit: Payer: Self-pay

## 2021-09-27 DIAGNOSIS — I132 Hypertensive heart and chronic kidney disease with heart failure and with stage 5 chronic kidney disease, or end stage renal disease: Secondary | ICD-10-CM | POA: Insufficient documentation

## 2021-09-27 DIAGNOSIS — I509 Heart failure, unspecified: Secondary | ICD-10-CM | POA: Insufficient documentation

## 2021-09-27 DIAGNOSIS — N186 End stage renal disease: Secondary | ICD-10-CM | POA: Insufficient documentation

## 2021-09-27 DIAGNOSIS — K2990 Gastroduodenitis, unspecified, without bleeding: Secondary | ICD-10-CM | POA: Insufficient documentation

## 2021-09-27 DIAGNOSIS — R1033 Periumbilical pain: Secondary | ICD-10-CM

## 2021-09-27 DIAGNOSIS — E1122 Type 2 diabetes mellitus with diabetic chronic kidney disease: Secondary | ICD-10-CM | POA: Diagnosis not present

## 2021-09-27 DIAGNOSIS — R109 Unspecified abdominal pain: Secondary | ICD-10-CM | POA: Diagnosis present

## 2021-09-27 DIAGNOSIS — Z992 Dependence on renal dialysis: Secondary | ICD-10-CM | POA: Insufficient documentation

## 2021-09-27 DIAGNOSIS — K2970 Gastritis, unspecified, without bleeding: Secondary | ICD-10-CM

## 2021-09-27 LAB — COMPREHENSIVE METABOLIC PANEL
ALT: 17 U/L (ref 0–44)
AST: 29 U/L (ref 15–41)
Albumin: 3.5 g/dL (ref 3.5–5.0)
Alkaline Phosphatase: 83 U/L (ref 38–126)
Anion gap: 9 (ref 5–15)
BUN: 16 mg/dL (ref 8–23)
CO2: 25 mmol/L (ref 22–32)
Calcium: 8.8 mg/dL — ABNORMAL LOW (ref 8.9–10.3)
Chloride: 101 mmol/L (ref 98–111)
Creatinine, Ser: 3.62 mg/dL — ABNORMAL HIGH (ref 0.44–1.00)
GFR, Estimated: 12 mL/min — ABNORMAL LOW (ref >60)
Glucose, Bld: 175 mg/dL — ABNORMAL HIGH (ref 70–99)
Potassium: 4 mmol/L (ref 3.5–5.1)
Sodium: 135 mmol/L (ref 135–145)
Total Bilirubin: 0.6 mg/dL (ref 0.3–1.2)
Total Protein: 7.1 g/dL (ref 6.5–8.1)

## 2021-09-27 LAB — CBC WITH DIFFERENTIAL/PLATELET
Abs Immature Granulocytes: 0.01 10*3/uL (ref 0.00–0.07)
Basophils Absolute: 0.1 10*3/uL (ref 0.0–0.1)
Basophils Relative: 1 %
Eosinophils Absolute: 0.3 10*3/uL (ref 0.0–0.5)
Eosinophils Relative: 7 %
HCT: 29.8 % — ABNORMAL LOW (ref 36.0–46.0)
Hemoglobin: 9.3 g/dL — ABNORMAL LOW (ref 12.0–15.0)
Immature Granulocytes: 0 %
Lymphocytes Relative: 28 %
Lymphs Abs: 1.2 10*3/uL (ref 0.7–4.0)
MCH: 33.7 pg (ref 26.0–34.0)
MCHC: 31.2 g/dL (ref 30.0–36.0)
MCV: 108 fL — ABNORMAL HIGH (ref 80.0–100.0)
Monocytes Absolute: 0.4 10*3/uL (ref 0.1–1.0)
Monocytes Relative: 10 %
Neutro Abs: 2.3 10*3/uL (ref 1.7–7.7)
Neutrophils Relative %: 54 %
Platelets: 161 10*3/uL (ref 150–400)
RBC: 2.76 MIL/uL — ABNORMAL LOW (ref 3.87–5.11)
RDW: 12.5 % (ref 11.5–15.5)
WBC: 4.2 10*3/uL (ref 4.0–10.5)
nRBC: 0 % (ref 0.0–0.2)

## 2021-09-27 LAB — LIPASE, BLOOD: Lipase: 28 U/L (ref 11–51)

## 2021-09-27 MED ORDER — ONDANSETRON 4 MG PO TBDP: 4.0000 mg | ORAL_TABLET | Freq: Three times a day (TID) | ORAL | 0 refills | Status: DC | PRN

## 2021-09-27 MED ORDER — PANTOPRAZOLE SODIUM 20 MG PO TBEC: 20.0000 mg | DELAYED_RELEASE_TABLET | Freq: Every day | ORAL | 1 refills | Status: DC

## 2021-09-27 MED ORDER — ONDANSETRON HCL 4 MG/2ML IJ SOLN
4.0000 mg | Freq: Once | INTRAMUSCULAR | Status: AC
  Administered 2021-09-27: 4 mg via INTRAVENOUS
  Filled 2021-09-27: qty 2

## 2021-09-27 MED ORDER — IOHEXOL 300 MG/ML  SOLN
100.0000 mL | Freq: Once | INTRAMUSCULAR | Status: AC | PRN
  Administered 2021-09-27: 100 mL via INTRAVENOUS

## 2021-09-27 NOTE — ED Notes (Signed)
Pt transported to CT ?

## 2021-09-27 NOTE — ED Triage Notes (Signed)
C/O stomach ache, weakness, decreased appetite, nausea, and constipation.  Last BM:  09/07/2021.  C/O fatigue when walking.  States symptoms have been ongoing x 15 days.  AAOx3.  Skin warm and dry. NAD

## 2021-09-27 NOTE — ED Provider Notes (Signed)
[facility] Provider Note    Event Date/Time   First MD Initiated Contact with Patient 09/27/21 [phone]     (approximate)   History   Chief Complaint Abdominal Pain  HPI  Robin Rogers is a 81 y.o. female with a past medical history of hypertension, diabetes, ESRD on HD (TTS), CHF, and blindness who presents to the ED complaining of abdominal pain.  History is limited as patient is Spanish-speaking only, history obtained with in person interpreter.  Patient reports that for the past 2 weeks she has been dealing with intermittent pain starting around her umbilicus and spreading outwards to affect her entire abdomen.  She states that is present primarily when she goes to eat, reports eating only a couple of tortillas with significant pain and nausea.  She has not vomited but does state that she has been intermittently constipated.  She did have a normal bowel movement yesterday, denies any recent blood in her stool.  She has not had any fevers or flank pain, does continue to make a small amount of urine and denies any dysuria.  She has been receiving dialysis as scheduled, last received hemodialysis yesterday.     Physical Exam   Triage Vital Signs: ED Triage Vitals  Enc Vitals Group     BP 09/27/21 0845 (!) 186/61     Pulse Rate 09/27/21 0845 88     Resp 09/27/21 0845 16     Temp 09/27/21 0845 99.3 F (37.4 C)     Temp Source 09/27/21 0845 Oral     SpO2 09/27/21 0845 92 %     Weight 09/27/21 0840 166 lb 7.2 oz (75.5 kg)     Height 09/27/21 0840 4\' 11"  (1.499 m)     Head Circumference --      Peak Flow --      Pain Score 09/27/21 0840 0     Pain Loc --      Pain Edu? --      Excl. in [place]? --     Most recent vital signs: Vitals:   09/27/21 1025 09/27/21 1050  BP:  (!) 183/59  Pulse: 76 74  Resp: 15 19  Temp:    SpO2: 98% 96%    Constitutional: Alert and oriented. Eyes: Conjunctivae are normal. Head: Atraumatic. Nose: No  congestion/rhinnorhea. Mouth/Throat: Mucous membranes are moist.  Cardiovascular: Normal rate, regular rhythm. Grossly normal heart sounds.  2+ radial pulses bilaterally.  Left upper extremity AV fistula with palpable thrill.  Left IJ TDC in place. Respiratory: Normal respiratory effort.  No retractions. Lungs CTAB. Gastrointestinal: Soft and diffusely tender to palpation, greatest in the bilateral lower quadrants. No distention. Musculoskeletal: No lower extremity tenderness nor edema.  Neurologic:  Normal speech and language. No gross focal neurologic deficits are appreciated.    ED Results / Procedures / Treatments   Labs (all labs ordered are listed, but only abnormal results are displayed) Labs Reviewed  CBC WITH DIFFERENTIAL/PLATELET - Abnormal; Notable for the following components:      Result Value   RBC 2.76 (*)    Hemoglobin 9.3 (*)    HCT 29.8 (*)    MCV 108.0 (*)    All other components within normal limits  COMPREHENSIVE METABOLIC PANEL - Abnormal; Notable for the following components:   Glucose, Bld 175 (*)    Creatinine, Ser 3.62 (*)    Calcium 8.8 (*)    GFR, Estimated 12 (*)    All other components  within normal limits  LIPASE, BLOOD     EKG  ED ECG REPORT I, Blake Divine, the attending physician, personally viewed and interpreted this ECG.   Date: 09/27/2021  EKG Time: 8:54  Rate: 90  Rhythm: normal sinus rhythm  Axis: RAD  Intervals:none  ST&T Change: None  RADIOLOGY CT abdomen/pelvis reviewed and interpreted by me with no inflammatory changes, dilated bowel loops, or focal fluid collections.  PROCEDURES:  Critical Care performed: No  Procedures   MEDICATIONS ORDERED IN ED: Medications  ondansetron (ZOFRAN) injection 4 mg (4 mg Intravenous Given 09/27/21 1024)  iohexol (OMNIPAQUE) 300 MG/ML solution 100 mL (100 mLs Intravenous Contrast Given 09/27/21 0959)     IMPRESSION / MDM / ASSESSMENT AND PLAN / ED COURSE  I reviewed the triage  vital signs and the nursing notes.                              81 y.o. female with past medical history of hypertension, diabetes, CHF, ESRD on HD (TTS) who presents to the ED complaining of 2 weeks of intermittent pain across her abdomen diffusely associated with nausea whenever she goes to eat.  Patient's presentation is most consistent with acute presentation with potential threat to life or bodily function.  Differential diagnosis includes, but is not limited to, SBO, constipation, biliary colic, cholecystitis, hepatitis, pancreatitis, electrolyte abnormality, anemia, peptic ulcer disease, gastritis, diverticulitis, UTI.  Patient nontoxic-appearing and in no acute distress, vital signs are unremarkable.  She does have diffuse tenderness to her abdomen on exam, greatest in the bilateral lower quadrants.  Given location of tenderness, lower suspicion for biliary pathology and we will further assess with CT imaging.  Labs thus far are reassuring and consistent with known ESRD, no significant electrolyte abnormality noted.  Patient with chronic anemia unchanged today, no significant leukocytosis or electrolyte abnormality, LFTs are unremarkable.  CT scan is unremarkable and patient is feeling better on reassessment with no abdominal pain at this time.  She has been unable to provide a urine sample, but continues to deny any urinary symptoms, low suspicion for UTI.  Given she has symptoms primarily when she eats, suspect component of gastritis.  We will start patient on pantoprazole and she was prescribed Zofran to take as needed.  She was counseled to return to the ED for new or worsening symptoms, patient and daughter agree with plan.      FINAL CLINICAL IMPRESSION(S) / ED DIAGNOSES   Final diagnoses:  Periumbilical abdominal pain  Gastritis and gastroduodenitis     Rx / DC Orders   ED Discharge Orders          Ordered    pantoprazole (PROTONIX) 20 MG tablet  Daily        09/27/21  1227    ondansetron (ZOFRAN-ODT) 4 MG disintegrating tablet  Every 8 hours PRN        09/27/21 1227             Note:  This document was prepared using Dragon voice recognition software and may include unintentional dictation errors.   Blake Divine, MD 09/27/21 1229

## 2021-10-28 ENCOUNTER — Emergency Department: Payer: Medicare Other

## 2021-10-28 ENCOUNTER — Emergency Department
Admission: EM | Admit: 2021-10-28 | Discharge: 2021-10-28 | Disposition: A | Discharge status: Home / Self Care | Payer: Medicare Other | Attending: Emergency Medicine | Admitting: Emergency Medicine

## 2021-10-28 ENCOUNTER — Encounter: Payer: Self-pay | Admitting: Emergency Medicine

## 2021-10-28 ENCOUNTER — Other Ambulatory Visit: Payer: Self-pay

## 2021-10-28 DIAGNOSIS — R1030 Lower abdominal pain, unspecified: Secondary | ICD-10-CM

## 2021-10-28 DIAGNOSIS — I509 Heart failure, unspecified: Secondary | ICD-10-CM | POA: Diagnosis not present

## 2021-10-28 DIAGNOSIS — E1122 Type 2 diabetes mellitus with diabetic chronic kidney disease: Secondary | ICD-10-CM | POA: Insufficient documentation

## 2021-10-28 DIAGNOSIS — N186 End stage renal disease: Secondary | ICD-10-CM | POA: Diagnosis not present

## 2021-10-28 DIAGNOSIS — I132 Hypertensive heart and chronic kidney disease with heart failure and with stage 5 chronic kidney disease, or end stage renal disease: Secondary | ICD-10-CM | POA: Insufficient documentation

## 2021-10-28 DIAGNOSIS — K5901 Slow transit constipation: Secondary | ICD-10-CM | POA: Diagnosis not present

## 2021-10-28 DIAGNOSIS — K5900 Constipation, unspecified: Secondary | ICD-10-CM | POA: Diagnosis present

## 2021-10-28 LAB — COMPREHENSIVE METABOLIC PANEL
ALT: 15 U/L (ref 0–44)
AST: 24 U/L (ref 15–41)
Albumin: 3.6 g/dL (ref 3.5–5.0)
Alkaline Phosphatase: 77 U/L (ref 38–126)
Anion gap: 10 (ref 5–15)
BUN: 25 mg/dL — ABNORMAL HIGH (ref 8–23)
CO2: 26 mmol/L (ref 22–32)
Calcium: 8.7 mg/dL — ABNORMAL LOW (ref 8.9–10.3)
Chloride: 95 mmol/L — ABNORMAL LOW (ref 98–111)
Creatinine, Ser: 4.65 mg/dL — ABNORMAL HIGH (ref 0.44–1.00)
GFR, Estimated: 9 mL/min — ABNORMAL LOW (ref >60)
Glucose, Bld: 102 mg/dL — ABNORMAL HIGH (ref 70–99)
Potassium: 4 mmol/L (ref 3.5–5.1)
Sodium: 131 mmol/L — ABNORMAL LOW (ref 135–145)
Total Bilirubin: 0.7 mg/dL (ref 0.3–1.2)
Total Protein: 7.1 g/dL (ref 6.5–8.1)

## 2021-10-28 LAB — CBC
HCT: 30.2 % — ABNORMAL LOW (ref 36.0–46.0)
Hemoglobin: 9.5 g/dL — ABNORMAL LOW (ref 12.0–15.0)
MCH: 33.6 pg (ref 26.0–34.0)
MCHC: 31.5 g/dL (ref 30.0–36.0)
MCV: 106.7 fL — ABNORMAL HIGH (ref 80.0–100.0)
Platelets: 120 10*3/uL — ABNORMAL LOW (ref 150–400)
RBC: 2.83 MIL/uL — ABNORMAL LOW (ref 3.87–5.11)
RDW: 12.5 % (ref 11.5–15.5)
WBC: 3.3 10*3/uL — ABNORMAL LOW (ref 4.0–10.5)
nRBC: 0 % (ref 0.0–0.2)

## 2021-10-28 LAB — LIPASE, BLOOD: Lipase: 25 U/L (ref 11–51)

## 2021-10-28 MED ORDER — POLYETHYLENE GLYCOL 3350 17 G PO PACK: 17.0000 g | PACK | Freq: Every day | ORAL | 0 refills | Status: DC

## 2021-10-28 MED ORDER — DOCUSATE SODIUM 100 MG PO CAPS: 100.0000 mg | ORAL_CAPSULE | Freq: Two times a day (BID) | ORAL | 2 refills | Status: AC

## 2021-10-28 MED ORDER — MAGNESIUM CITRATE PO SOLN: 1.0000 | Freq: Once | ORAL | 1 refills | Status: AC

## 2021-10-28 NOTE — ED Provider Notes (Signed)
[facility] Provider Note    Event Date/Time   First MD Initiated Contact with Patient 10/28/21 1045     (approximate)   History   Abdominal Pain   HPI  Robin Rogers is a 81 y.o. female with a history of CHF, end-stage renal disease, diabetes, hypertension who presents with complaints of constipation and bloating.  She reports has had problems with constipation before.  Was seen this for this month ago had been doing better however has not had a bowel movement the last 4 days, feels bloated and has intermittent cramping.  Spanish interpreter used     Physical Exam   Triage Vital Signs: ED Triage Vitals  Enc Vitals Group     BP 10/28/21 1022 (!) 191/59     Pulse Rate 10/28/21 1022 67     Resp 10/28/21 1022 20     Temp 10/28/21 1022 98.1 F (36.7 C)     Temp Source 10/28/21 1022 Oral     SpO2 10/28/21 1022 97 %     Weight --      Height --      Head Circumference --      Peak Flow --      Pain Score 10/28/21 1025 6     Pain Loc --      Pain Edu? --      Excl. in [place]? --     Most recent vital signs: Vitals:   10/28/21 1022 10/28/21 1226  BP: (!) 191/59 (!) 160/72  Pulse: 67 70  Resp: 20 20  Temp: 98.1 F (36.7 C) 98 F (36.7 C)  SpO2: 97% 97%     General: Awake, no distress.  CV:  Good peripheral perfusion.  Resp:  Normal effort.  Abd:  No distention.  Soft, nontender reassuring exam Other:     ED Results / Procedures / Treatments   Labs (all labs ordered are listed, but only abnormal results are displayed) Labs Reviewed  COMPREHENSIVE METABOLIC PANEL - Abnormal; Notable for the following components:      Result Value   Sodium 131 (*)    Chloride 95 (*)    Glucose, Bld 102 (*)    BUN 25 (*)    Creatinine, Ser 4.65 (*)    Calcium 8.7 (*)    GFR, Estimated 9 (*)    All other components within normal limits  CBC - Abnormal; Notable for the following components:   WBC 3.3 (*)    RBC 2.83 (*)    Hemoglobin 9.5 (*)     HCT 30.2 (*)    MCV 106.7 (*)    Platelets 120 (*)    All other components within normal limits  LIPASE, BLOOD  URINALYSIS, ROUTINE W REFLEX MICROSCOPIC     EKG     RADIOLOGY Abdominal x-ray viewed interpreted by me, normal gas patterns    PROCEDURES:  Critical Care performed:   Procedures   MEDICATIONS ORDERED IN ED: Medications - No data to display   IMPRESSION / MDM / [place] / ED COURSE  I reviewed the triage vital signs and the nursing notes. Patient's presentation is most consistent with acute presentation with potential threat to life or bodily function.  Patient presents with abdominal pain as detailed above.  She has multiple comorbidities.  Her exam is overall reassuring.  Differential includes constipation, SBO, less likely diverticulitis  X-ray is reassuring, no evidence of abnormal gas patterns.  Abdominal exam is not consistent  with diverticulitis.  Notable amount of stool in the right colon on x-ray.  This is likely the cause of her symptoms.  We will start her on MiraLAX, Colace and magnesium citrate      FINAL CLINICAL IMPRESSION(S) / ED DIAGNOSES   Final diagnoses:  Lower abdominal pain  Slow transit constipation     Rx / DC Orders   ED Discharge Orders          Ordered    polyethylene glycol (MIRALAX) 17 g packet  Daily        10/28/21 1209    magnesium citrate SOLN   Once        10/28/21 1209    docusate sodium (COLACE) 100 MG capsule  2 times daily        10/28/21 1209             Note:  This document was prepared using Dragon voice recognition software and may include unintentional dictation errors.   Lavonia Drafts, MD 10/28/21 [phone]

## 2021-10-28 NOTE — ED Triage Notes (Signed)
Pt via POV from home. Pt c/o generalized abd pain, vomiting, and constipation. States that she was seen a month ago and was prescribed medication for constipation and it did not helped. Last BM was Friday. Denies fever. Pt is A&Ox4 and NAD  Spanish interpreter needed.  Per [place] interpreter, pt speaks a different dialect. Pt is also bline

## 2021-11-13 ENCOUNTER — Encounter: Payer: Self-pay | Admitting: Emergency Medicine

## 2021-11-13 ENCOUNTER — Emergency Department
Admission: EM | Admit: 2021-11-13 | Discharge: 2021-11-13 | Disposition: A | Discharge status: Home / Self Care | Payer: Medicare Other | Attending: Emergency Medicine | Admitting: Emergency Medicine

## 2021-11-13 ENCOUNTER — Emergency Department: Payer: Medicare Other

## 2021-11-13 ENCOUNTER — Other Ambulatory Visit: Payer: Self-pay

## 2021-11-13 DIAGNOSIS — R1084 Generalized abdominal pain: Secondary | ICD-10-CM | POA: Insufficient documentation

## 2021-11-13 DIAGNOSIS — R0602 Shortness of breath: Secondary | ICD-10-CM | POA: Diagnosis not present

## 2021-11-13 DIAGNOSIS — I132 Hypertensive heart and chronic kidney disease with heart failure and with stage 5 chronic kidney disease, or end stage renal disease: Secondary | ICD-10-CM | POA: Diagnosis not present

## 2021-11-13 DIAGNOSIS — E1122 Type 2 diabetes mellitus with diabetic chronic kidney disease: Secondary | ICD-10-CM | POA: Insufficient documentation

## 2021-11-13 DIAGNOSIS — N186 End stage renal disease: Secondary | ICD-10-CM | POA: Diagnosis not present

## 2021-11-13 DIAGNOSIS — K5900 Constipation, unspecified: Secondary | ICD-10-CM | POA: Insufficient documentation

## 2021-11-13 DIAGNOSIS — I509 Heart failure, unspecified: Secondary | ICD-10-CM | POA: Insufficient documentation

## 2021-11-13 DIAGNOSIS — R140 Abdominal distension (gaseous): Secondary | ICD-10-CM | POA: Diagnosis not present

## 2021-11-13 DIAGNOSIS — R110 Nausea: Secondary | ICD-10-CM | POA: Insufficient documentation

## 2021-11-13 DIAGNOSIS — R109 Unspecified abdominal pain: Secondary | ICD-10-CM

## 2021-11-13 LAB — COMPREHENSIVE METABOLIC PANEL
ALT: 11 U/L (ref 0–44)
AST: 21 U/L (ref 15–41)
Albumin: 3.4 g/dL — ABNORMAL LOW (ref 3.5–5.0)
Alkaline Phosphatase: 77 U/L (ref 38–126)
Anion gap: 8 (ref 5–15)
BUN: 18 mg/dL (ref 8–23)
CO2: 25 mmol/L (ref 22–32)
Calcium: 8.5 mg/dL — ABNORMAL LOW (ref 8.9–10.3)
Chloride: 100 mmol/L (ref 98–111)
Creatinine, Ser: 3.48 mg/dL — ABNORMAL HIGH (ref 0.44–1.00)
GFR, Estimated: 13 mL/min — ABNORMAL LOW (ref >60)
Glucose, Bld: 162 mg/dL — ABNORMAL HIGH (ref 70–99)
Potassium: 3.3 mmol/L — ABNORMAL LOW (ref 3.5–5.1)
Sodium: 133 mmol/L — ABNORMAL LOW (ref 135–145)
Total Bilirubin: 1.1 mg/dL (ref 0.3–1.2)
Total Protein: 6.9 g/dL (ref 6.5–8.1)

## 2021-11-13 LAB — CBC
HCT: 31.6 % — ABNORMAL LOW (ref 36.0–46.0)
Hemoglobin: 10.1 g/dL — ABNORMAL LOW (ref 12.0–15.0)
MCH: 33.7 pg (ref 26.0–34.0)
MCHC: 32 g/dL (ref 30.0–36.0)
MCV: 105.3 fL — ABNORMAL HIGH (ref 80.0–100.0)
Platelets: 109 10*3/uL — ABNORMAL LOW (ref 150–400)
RBC: 3 MIL/uL — ABNORMAL LOW (ref 3.87–5.11)
RDW: 12.6 % (ref 11.5–15.5)
WBC: 3.7 10*3/uL — ABNORMAL LOW (ref 4.0–10.5)
nRBC: 0 % (ref 0.0–0.2)

## 2021-11-13 LAB — LIPASE, BLOOD: Lipase: 29 U/L (ref 11–51)

## 2021-11-13 MED ORDER — ONDANSETRON HCL 4 MG/2ML IJ SOLN
4.0000 mg | Freq: Once | INTRAMUSCULAR | Status: AC
  Administered 2021-11-13: 4 mg via INTRAVENOUS
  Filled 2021-11-13: qty 2

## 2021-11-13 MED ORDER — MORPHINE SULFATE (PF) 4 MG/ML IV SOLN
4.0000 mg | Freq: Once | INTRAVENOUS | Status: AC
  Administered 2021-11-13: 4 mg via INTRAVENOUS
  Filled 2021-11-13: qty 1

## 2021-11-13 MED ORDER — IOHEXOL 300 MG/ML  SOLN
100.0000 mL | Freq: Once | INTRAMUSCULAR | Status: AC | PRN
  Administered 2021-11-13: 100 mL via INTRAVENOUS

## 2021-11-13 NOTE — ED Notes (Signed)
Provided warm blanket.

## 2021-11-13 NOTE — ED Notes (Signed)
Pt in CT.

## 2021-11-13 NOTE — ED Notes (Signed)
Pt to ED with family member, pt speaks [place] and [place]. Family member speaks [place]. Pt gets Tue Thur Sat dialysis. L arm restricted. To ED for severe abdominal pain, lower central area also LLQ and also LUQ since 4 days, worse today. Pt moaning and grimacing. Had full dialysis session yesterday. Placed on cardiac monitor.

## 2021-11-13 NOTE — ED Provider Notes (Signed)
[facility] Provider Note    Event Date/Time   First MD Initiated Contact with Patient 11/13/21 0820     (approximate)   History   Abdominal Pain Formal translating service was utilized  HPI  Robin Rogers is a 81 y.o. female with past medical history significant for hypertension, ESRD on HD (TTS), CHF, blindness, hypertension and diabetes who presents to the emergency department with abdominal pain.  Patient states that she has been having abdominal pain intermittently for a long time but it has significantly worsened since yesterday.  Last bowel movement was 3 days ago.  Endorses nausea and vomiting.  Went to dialysis yesterday and completed a full session.  Makes only a minimal amount of urine and does not have any burning with urination.  Denies any blood in her stool.  Denies any chest pain.  Endorses some mild shortness of breath.  States that she has been seen a couple of times for similar abdominal pain however it is worse today.      Physical Exam   Triage Vital Signs: ED Triage Vitals  Enc Vitals Group     BP 11/13/21 0809 (!) 149/48     Pulse Rate 11/13/21 0809 78     Resp 11/13/21 0809 18     Temp 11/13/21 0809 98.7 F (37.1 C)     Temp Source 11/13/21 0809 Oral     SpO2 11/13/21 0809 98 %     Weight --      Height --      Head Circumference --      Peak Flow --      Pain Score 11/13/21 0813 9     Pain Loc --      Pain Edu? --      Excl. in [place]? --     Most recent vital signs: Vitals:   11/13/21 0915 11/13/21 1045  BP:    Pulse: 86 75  Resp: 16 16  Temp:    SpO2:      Physical Exam Constitutional:      Appearance: She is well-developed.  HENT:     Head: Atraumatic.  Eyes:     Conjunctiva/sclera: Conjunctivae normal.     Comments: Chronic blindness changes  Cardiovascular:     Rate and Rhythm: Normal rate and regular rhythm.     Comments: Pitting edema to bilateral lower extremities with no unilateral leg  swelling Pulmonary:     Effort: No respiratory distress.     Breath sounds: No wheezing.  Abdominal:     General: There is distension.     Tenderness: There is generalized abdominal tenderness.     Comments: Abdominal wall with edema  Musculoskeletal:        General: Normal range of motion.     Cervical back: Normal range of motion.  Skin:    General: Skin is warm.     Comments: PermCath to the left upper chest wall  Neurological:     Mental Status: She is alert. Mental status is at baseline.          IMPRESSION / MDM / ASSESSMENT AND PLAN / ED COURSE  I reviewed the triage vital signs and the nursing notes.  81 year old female with past medical history significant for ESRD who presents emergency department with abdominal pain and constipation with nausea and vomiting.  Makes minimal amount of urine.  On arrival afebrile, hemodynamically stable.  In obvious distress.  Differential diagnosis including small bowel  obstruction, diverticulitis, SBP, constipation, appendicitis  Patient given IV morphine and ondansetron.  We will do labs and CT scan of abdomen and pelvis to further evaluate for intra-abdominal pathology.  EKG  EKG showed normal sinus rhythm.  Normal intervals.  No chamber enlargement.  QTc 474.  No significant ST elevation or depression  No tachycardic or bradycardic dysrhythmias while on cardiac telemetry  RADIOLOGY I independently reviewed imaging, my interpretation of imaging: CT abdomen and pelvis -no obvious signs of a small bowel obstruction or constipation.  No significant stool burden.   CT scan without obvious etiology of the patient's abdominal pain.  Moderate pericardial effusion noted.  Pelvic floor laxity.     ED Results / Procedures / Treatments   Labs (all labs ordered are listed, but only abnormal results are displayed) Labs interpreted as -   Creatinine is at her baseline.  No significant hyperkalemia.  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  No significantly elevated BUN or signs of GI bleed.  Labs Reviewed  COMPREHENSIVE METABOLIC PANEL - Abnormal; Notable for the following components:      Result Value   Sodium 133 (*)    Potassium 3.3 (*)    Glucose, Bld 162 (*)    Creatinine, Ser 3.48 (*)    Calcium 8.5 (*)    Albumin 3.4 (*)    GFR, Estimated 13 (*)    All other components within normal limits  CBC - Abnormal; Notable for the following components:   WBC 3.7 (*)    RBC 3.00 (*)    Hemoglobin 10.1 (*)    HCT 31.6 (*)    MCV 105.3 (*)    Platelets 109 (*)    All other components within normal limits  LIPASE, BLOOD  URINALYSIS, ROUTINE W REFLEX MICROSCOPIC    Patient was given IV pain medication and IV antiemetics.  Incidental finding of pericardial effusion that is moderate in size.  Patient does not have any symptoms of significant shortness of breath or tamponade physiology most likely secondary to ESRD.  On reevaluation patient states that she is feeling better.  Concern for constipation.  After long discussion with the patient and her daughter, do not feel that she has been doing a good MiraLAX cleanout.  Discussed at length the dose of MiraLAX.  Discussed the need to follow-up with her primary care physician for her ongoing abdominal pain because she likes that he needs further work-up and possible evaluation with gastroenterology for colonoscopy/endoscopy.     PROCEDURES:  Critical Care performed: No  Procedures  Patient's presentation is most consistent with acute presentation with potential threat to life or bodily function.   MEDICATIONS ORDERED IN ED: Medications  morphine (PF) 4 MG/ML injection 4 mg (4 mg Intravenous Given 11/13/21 0901)  ondansetron (ZOFRAN) injection 4 mg (4 mg Intravenous Given 11/13/21 0901)  iohexol (OMNIPAQUE) 300 MG/ML solution 100 mL (100 mLs Intravenous Contrast Given 11/13/21 1003)    FINAL CLINICAL IMPRESSION(S) / ED  DIAGNOSES   Final diagnoses:  Abdominal pain, unspecified abdominal location  Constipation, unspecified constipation type  Nausea     Rx / DC Orders   ED Discharge Orders     None        Note:  This document was prepared using Dragon voice recognition software and may include unintentional dictation errors.   Nathaniel Man, MD 11/13/21 1205

## 2021-11-13 NOTE — ED Triage Notes (Signed)
Patient to ED via POV for left sided abd pain that radiates into back. Patient states she has not had a BM in 3 days. Patient is dialysis- T, TH, S and did receive a full session yesterday. Denies urinary symptoms- but does not produce much urine.

## 2021-11-13 NOTE — ED Notes (Signed)
Performed 12 lead EKG. Sinus rhythm on EKG. Provider saw pt at bedside.

## 2021-11-17 ENCOUNTER — Emergency Department
Admission: EM | Admit: 2021-11-17 | Discharge: 2021-11-17 | Disposition: A | Discharge status: Home / Self Care | Payer: Medicare Other | Attending: Emergency Medicine | Admitting: Emergency Medicine

## 2021-11-17 DIAGNOSIS — I132 Hypertensive heart and chronic kidney disease with heart failure and with stage 5 chronic kidney disease, or end stage renal disease: Secondary | ICD-10-CM | POA: Diagnosis not present

## 2021-11-17 DIAGNOSIS — I509 Heart failure, unspecified: Secondary | ICD-10-CM | POA: Insufficient documentation

## 2021-11-17 DIAGNOSIS — R109 Unspecified abdominal pain: Secondary | ICD-10-CM

## 2021-11-17 DIAGNOSIS — R112 Nausea with vomiting, unspecified: Secondary | ICD-10-CM | POA: Diagnosis not present

## 2021-11-17 DIAGNOSIS — Z992 Dependence on renal dialysis: Secondary | ICD-10-CM | POA: Diagnosis not present

## 2021-11-17 DIAGNOSIS — R1084 Generalized abdominal pain: Secondary | ICD-10-CM | POA: Diagnosis present

## 2021-11-17 DIAGNOSIS — N186 End stage renal disease: Secondary | ICD-10-CM | POA: Insufficient documentation

## 2021-11-17 DIAGNOSIS — E1122 Type 2 diabetes mellitus with diabetic chronic kidney disease: Secondary | ICD-10-CM | POA: Diagnosis not present

## 2021-11-17 LAB — COMPREHENSIVE METABOLIC PANEL
ALT: 12 U/L (ref 0–44)
AST: 22 U/L (ref 15–41)
Albumin: 3.5 g/dL (ref 3.5–5.0)
Alkaline Phosphatase: 77 U/L (ref 38–126)
Anion gap: 8 (ref 5–15)
BUN: 13 mg/dL (ref 8–23)
CO2: 28 mmol/L (ref 22–32)
Calcium: 8.6 mg/dL — ABNORMAL LOW (ref 8.9–10.3)
Chloride: 99 mmol/L (ref 98–111)
Creatinine, Ser: 3.22 mg/dL — ABNORMAL HIGH (ref 0.44–1.00)
GFR, Estimated: 14 mL/min — ABNORMAL LOW (ref >60)
Glucose, Bld: 114 mg/dL — ABNORMAL HIGH (ref 70–99)
Potassium: 3.4 mmol/L — ABNORMAL LOW (ref 3.5–5.1)
Sodium: 135 mmol/L (ref 135–145)
Total Bilirubin: 0.9 mg/dL (ref 0.3–1.2)
Total Protein: 7.2 g/dL (ref 6.5–8.1)

## 2021-11-17 LAB — CBC
HCT: 31.5 % — ABNORMAL LOW (ref 36.0–46.0)
Hemoglobin: 10 g/dL — ABNORMAL LOW (ref 12.0–15.0)
MCH: 33.6 pg (ref 26.0–34.0)
MCHC: 31.7 g/dL (ref 30.0–36.0)
MCV: 105.7 fL — ABNORMAL HIGH (ref 80.0–100.0)
Platelets: 112 10*3/uL — ABNORMAL LOW (ref 150–400)
RBC: 2.98 MIL/uL — ABNORMAL LOW (ref 3.87–5.11)
RDW: 12.7 % (ref 11.5–15.5)
WBC: 3.3 10*3/uL — ABNORMAL LOW (ref 4.0–10.5)
nRBC: 0 % (ref 0.0–0.2)

## 2021-11-17 LAB — LIPASE, BLOOD: Lipase: 26 U/L (ref 11–51)

## 2021-11-17 MED ORDER — ONDANSETRON 4 MG PO TBDP: 4.0000 mg | ORAL_TABLET | Freq: Three times a day (TID) | ORAL | 0 refills | Status: DC | PRN

## 2021-11-17 MED ORDER — ONDANSETRON 4 MG PO TBDP
4.0000 mg | ORAL_TABLET | Freq: Once | ORAL | Status: AC
  Administered 2021-11-17: 4 mg via ORAL
  Filled 2021-11-17: qty 1

## 2021-11-17 MED ORDER — ACETAMINOPHEN 500 MG PO TABS
1000.0000 mg | ORAL_TABLET | Freq: Once | ORAL | Status: AC
  Administered 2021-11-17: 1000 mg via ORAL
  Filled 2021-11-17: qty 2

## 2021-11-17 MED ORDER — ALUM & MAG HYDROXIDE-SIMETH 200-200-20 MG/5ML PO SUSP
30.0000 mL | Freq: Once | ORAL | Status: AC
  Administered 2021-11-17: 30 mL via ORAL
  Filled 2021-11-17: qty 30

## 2021-11-17 MED ORDER — LIDOCAINE 5 % EX PTCH: 1.0000 | MEDICATED_PATCH | Freq: Two times a day (BID) | CUTANEOUS | 0 refills | Status: AC

## 2021-11-17 MED ORDER — PANTOPRAZOLE SODIUM 20 MG PO TBEC: 20.0000 mg | DELAYED_RELEASE_TABLET | Freq: Every day | ORAL | 0 refills | Status: AC

## 2021-11-17 MED ORDER — LIDOCAINE 5 % EX PTCH
1.0000 | MEDICATED_PATCH | Freq: Once | CUTANEOUS | Status: DC
  Administered 2021-11-17: 1 via TRANSDERMAL

## 2021-11-17 MED ORDER — ONDANSETRON 4 MG PO TBDP: 4.0000 mg | ORAL_TABLET | Freq: Three times a day (TID) | ORAL | 0 refills | Status: AC | PRN

## 2021-11-17 NOTE — ED Triage Notes (Signed)
Pt bib daughter c/o abd pain, n/v. Daughter says this is a recurrent issue and has been here several times recently for same

## 2021-11-17 NOTE — ED Provider Notes (Addendum)
[facility] Provider Note    Event Date/Time   First MD Initiated Contact with Patient 11/17/21 [phone]     (approximate)   History   Abdominal Pain   HPI  Robin Rogers is a 81 y.o. female with ESRD on hemodialysis Tuesdays Thursdays Saturdays, CHF, blindness, hypertension, diabetes who comes in with abdominal discomfort.  They report that patient's had weeks now of generalized abdominal discomfort and decreased wanting to eat and occasional nausea and vomiting with the last episode of vomiting this morning.  They report that because of the vomiting they brought her in to be evaluated.  They report that they have been told there is nothing else to be done after being to the ER multiple times.  They left for her having regular bowel movement yesterday and has not taken any laxatives do not feel it is related to constipation.  She reports similar discomfort in her back.  Denies any numbness tingling leg weakness, saddle anesthesia or rectal incontinence.  Patient did just get dialysis on Saturday.  Denies any missed sessions.  She denies any chest pain or shortness of breath.  Review of records patient has been here multiple times for this.  She was seen on 11/8 and was given some IV morphine and Zofran.  CT imaging was reassuring and they were supposed to use some MiraLAX.  She also had a CT scan on 9/22 was reassuring  They declined translator as the daughter is at bedside who prefers to translate Physical Exam   Triage Vital Signs: ED Triage Vitals [11/17/21 0554]  Enc Vitals Group     BP (!) 189/64     Pulse Rate 75     Resp (!) 24     Temp 98.3 F (36.8 C)     Temp Source Oral     SpO2 98 %     Weight      Height      Head Circumference      Peak Flow      Pain Score      Pain Loc      Pain Edu?      Excl. in [place]?     Most recent vital signs: Vitals:   11/17/21 0554  BP: (!) 189/64  Pulse: 75  Resp: (!) 24  Temp: 98.3 F (36.8 C)  SpO2:  98%     General: Awake, no distress.  CV:  Good peripheral perfusion.  Resp:  Normal effort.  Abd:  No distention.  Overall soft and nontender she reports a little bit of mid abdominal discomfort Other:     ED Results / Procedures / Treatments   Labs (all labs ordered are listed, but only abnormal results are displayed) Labs Reviewed  COMPREHENSIVE METABOLIC PANEL - Abnormal; Notable for the following components:      Result Value   Potassium 3.4 (*)    Glucose, Bld 114 (*)    Creatinine, Ser 3.22 (*)    Calcium 8.6 (*)    GFR, Estimated 14 (*)    All other components within normal limits  CBC - Abnormal; Notable for the following components:   WBC 3.3 (*)    RBC 2.98 (*)    Hemoglobin 10.0 (*)    HCT 31.5 (*)    MCV 105.7 (*)    Platelets 112 (*)    All other components within normal limits  LIPASE, BLOOD  URINALYSIS, ROUTINE W REFLEX MICROSCOPIC     EKG  My interpretation of EKG:  Normal rate of 73 without any ST elevation or T wave inversions normal intervals    PROCEDURES:  Critical Care performed: No  Procedures   MEDICATIONS ORDERED IN ED: Medications - No data to display   IMPRESSION / MDM / [place] / ED COURSE  I reviewed the triage vital signs and the nursing notes.   Patient's presentation is most consistent with acute presentation with potential threat to life or bodily function.   Patient comes in with continued abdominal discomfort, back pain, decreased p.o. intake, occasional nausea and vomiting for the past few months.  Discussed with them CT imaging.  They did have a CT scan 4 days ago and they declined CT imaging today given they report that this is the same thing that she has had every time she has been here.  We will trial a GI cocktail, Tylenol, lidocaine patch, Zofran to see if that can help control symptoms and check labs to evaluate for any hyperkalemia or dehydration.  EKG was little bit of artifact related to it and  they are reading as junctional but with a rate in the 70s however it does appear that she probably has P waves.  We discussed doing a repeat EKG but family declined stating that is not having any chest pain or shortness of breath that she just had EKG on 11/8 that showed sinus with that same rate in the 70s which is reassuring and they have declined a repeat EKG at this time  CBC shows low white count similar to 2 weeks ago and 4 days ago.  Her hemoglobin is stable platelets are stable.  Lipase is normal.  CMP shows elevated creatinine but patient has known ESRD.  Her potassium is 3.4 anion gap is normal and BUN is 13 so no indication for emergent dialysis.  Patient feels better after medications.  She is now tolerating p.o.  We discussed abdominal imaging and they have declined at this time.  She has not given a urine but does not believe making urine given she is a dialysis patient so we will hold off.  Patient's daughter is at bedside who states that she did go through her meds and make sure that she is taking all above right ones and that she is aware that already had known prescriptions for some these meds couple months ago liquid to make sure that the duplicates as well as write down to make sure that she is taking these daily and keep a journal of her pain and if the medication seem to be helping or not    FINAL CLINICAL IMPRESSION(S) / ED DIAGNOSES   Final diagnoses:  Abdominal pain, unspecified abdominal location     Rx / DC Orders   ED Discharge Orders          Ordered    ondansetron (ZOFRAN-ODT) 4 MG disintegrating tablet  Every 8 hours PRN        11/17/21 1122    pantoprazole (PROTONIX) 20 MG tablet  Daily        11/17/21 1122    lidocaine (LIDODERM) 5 %  Every 12 hours        11/17/21 1122             Note:  This document was prepared using Dragon voice recognition software and may include unintentional dictation errors.   Vanessa [place], MD 11/17/21 1130     Vanessa [place], MD 11/17/21 1139

## 2021-11-17 NOTE — Discharge Instructions (Addendum)
Patient take Tylenol 1 g every 8 hours, the Protonix every day, use the lidocaine patches and cut them in half and apply to your back and leave on for 12 hours and then take off for 12 hours.  Use the Zofran 30 minutes before eating too high to help with nausea and vomiting.  Up to 3 times a day.  Call the GI number to make a appointment with a GI doctor.  This could be gastroparesis or you may need a colonoscopy.  Return to the ER for worsening symptoms or any other concerns

## 2021-11-17 NOTE — ED Triage Notes (Signed)
  Pt bib daughter c/o abd pain, n/v. Daughter says this is a recurrent issue and has been here several times recently for same. Pt speaks [place]

## 2021-12-01 ENCOUNTER — Other Ambulatory Visit: Payer: Self-pay

## 2021-12-01 ENCOUNTER — Emergency Department
Admission: EM | Admit: 2021-12-01 | Discharge: 2021-12-01 | Disposition: A | Discharge status: Home / Self Care | Payer: Medicare Other | Attending: Emergency Medicine | Admitting: Emergency Medicine

## 2021-12-01 ENCOUNTER — Emergency Department: Payer: Medicare Other

## 2021-12-01 DIAGNOSIS — I509 Heart failure, unspecified: Secondary | ICD-10-CM | POA: Insufficient documentation

## 2021-12-01 DIAGNOSIS — E1122 Type 2 diabetes mellitus with diabetic chronic kidney disease: Secondary | ICD-10-CM | POA: Insufficient documentation

## 2021-12-01 DIAGNOSIS — E876 Hypokalemia: Secondary | ICD-10-CM | POA: Insufficient documentation

## 2021-12-01 DIAGNOSIS — Z992 Dependence on renal dialysis: Secondary | ICD-10-CM | POA: Insufficient documentation

## 2021-12-01 DIAGNOSIS — N186 End stage renal disease: Secondary | ICD-10-CM | POA: Diagnosis not present

## 2021-12-01 DIAGNOSIS — I132 Hypertensive heart and chronic kidney disease with heart failure and with stage 5 chronic kidney disease, or end stage renal disease: Secondary | ICD-10-CM | POA: Diagnosis not present

## 2021-12-01 DIAGNOSIS — R1084 Generalized abdominal pain: Secondary | ICD-10-CM | POA: Insufficient documentation

## 2021-12-01 DIAGNOSIS — K5900 Constipation, unspecified: Secondary | ICD-10-CM | POA: Diagnosis not present

## 2021-12-01 DIAGNOSIS — R109 Unspecified abdominal pain: Secondary | ICD-10-CM | POA: Diagnosis present

## 2021-12-01 LAB — COMPREHENSIVE METABOLIC PANEL
ALT: 9 U/L (ref 0–44)
AST: 22 U/L (ref 15–41)
Albumin: 3.3 g/dL — ABNORMAL LOW (ref 3.5–5.0)
Alkaline Phosphatase: 93 U/L (ref 38–126)
Anion gap: 8 (ref 5–15)
BUN: 13 mg/dL (ref 8–23)
CO2: 27 mmol/L (ref 22–32)
Calcium: 8.2 mg/dL — ABNORMAL LOW (ref 8.9–10.3)
Chloride: 100 mmol/L (ref 98–111)
Creatinine, Ser: 3.32 mg/dL — ABNORMAL HIGH (ref 0.44–1.00)
GFR, Estimated: 14 mL/min — ABNORMAL LOW (ref >60)
Glucose, Bld: 99 mg/dL (ref 70–99)
Potassium: 2.9 mmol/L — ABNORMAL LOW (ref 3.5–5.1)
Sodium: 135 mmol/L (ref 135–145)
Total Bilirubin: 1.2 mg/dL (ref 0.3–1.2)
Total Protein: 6.9 g/dL (ref 6.5–8.1)

## 2021-12-01 LAB — CBC WITH DIFFERENTIAL/PLATELET
Abs Immature Granulocytes: 0.01 10*3/uL (ref 0.00–0.07)
Basophils Absolute: 0 10*3/uL (ref 0.0–0.1)
Basophils Relative: 1 %
Eosinophils Absolute: 0.1 10*3/uL (ref 0.0–0.5)
Eosinophils Relative: 3 %
HCT: 32.8 % — ABNORMAL LOW (ref 36.0–46.0)
Hemoglobin: 10.5 g/dL — ABNORMAL LOW (ref 12.0–15.0)
Immature Granulocytes: 0 %
Lymphocytes Relative: 17 %
Lymphs Abs: 0.6 10*3/uL — ABNORMAL LOW (ref 0.7–4.0)
MCH: 33.9 pg (ref 26.0–34.0)
MCHC: 32 g/dL (ref 30.0–36.0)
MCV: 105.8 fL — ABNORMAL HIGH (ref 80.0–100.0)
Monocytes Absolute: 0.4 10*3/uL (ref 0.1–1.0)
Monocytes Relative: 11 %
Neutro Abs: 2.5 10*3/uL (ref 1.7–7.7)
Neutrophils Relative %: 68 %
Platelets: 148 10*3/uL — ABNORMAL LOW (ref 150–400)
RBC: 3.1 MIL/uL — ABNORMAL LOW (ref 3.87–5.11)
RDW: 14.5 % (ref 11.5–15.5)
WBC: 3.7 10*3/uL — ABNORMAL LOW (ref 4.0–10.5)
nRBC: 0 % (ref 0.0–0.2)

## 2021-12-01 LAB — LACTIC ACID, PLASMA: Lactic Acid, Venous: 0.7 mmol/L (ref 0.5–1.9)

## 2021-12-01 LAB — LIPASE, BLOOD: Lipase: 28 U/L (ref 11–51)

## 2021-12-01 MED ORDER — IOHEXOL 350 MG/ML SOLN
75.0000 mL | Freq: Once | INTRAVENOUS | Status: AC | PRN
  Administered 2021-12-01: 75 mL via INTRAVENOUS

## 2021-12-01 MED ORDER — POLYETHYLENE GLYCOL 3350 17 G PO PACK: 17.0000 g | PACK | Freq: Two times a day (BID) | ORAL | 0 refills | Status: AC

## 2021-12-01 MED ORDER — FAMOTIDINE 20 MG PO TABS
20.0000 mg | ORAL_TABLET | Freq: Once | ORAL | Status: AC
  Administered 2021-12-01: 20 mg via ORAL
  Filled 2021-12-01: qty 1

## 2021-12-01 MED ORDER — ALUM & MAG HYDROXIDE-SIMETH 200-200-20 MG/5ML PO SUSP
30.0000 mL | Freq: Once | ORAL | Status: AC
  Administered 2021-12-01: 30 mL via ORAL
  Filled 2021-12-01: qty 30

## 2021-12-01 MED ORDER — ACETAMINOPHEN 500 MG PO TABS
1000.0000 mg | ORAL_TABLET | Freq: Once | ORAL | Status: AC
  Administered 2021-12-01: 1000 mg via ORAL
  Filled 2021-12-01: qty 2

## 2021-12-01 MED ORDER — LIDOCAINE VISCOUS HCL 2 % MT SOLN
15.0000 mL | Freq: Once | OROMUCOSAL | Status: AC
  Administered 2021-12-01: 15 mL via ORAL
  Filled 2021-12-01: qty 15

## 2021-12-01 MED ORDER — POTASSIUM CHLORIDE CRYS ER 20 MEQ PO TBCR
40.0000 meq | EXTENDED_RELEASE_TABLET | Freq: Once | ORAL | Status: AC
  Administered 2021-12-01: 40 meq via ORAL
  Filled 2021-12-01: qty 2

## 2021-12-01 MED ORDER — ONDANSETRON HCL 4 MG/2ML IJ SOLN
4.0000 mg | Freq: Once | INTRAMUSCULAR | Status: AC
  Administered 2021-12-01: 4 mg via INTRAVENOUS
  Filled 2021-12-01: qty 2

## 2021-12-01 NOTE — ED Notes (Signed)
Family updated as to patient's status and MD at bedside speak with family

## 2021-12-01 NOTE — Discharge Instructions (Addendum)
See GI doctor as scheduled.   Take miralax twice daily.

## 2021-12-01 NOTE — ED Triage Notes (Signed)
Patient here for recurring issues of abd pain.  Has been here numerous times for the same. Patient is blind and speaks several other languages [place] and another. Patient from [place] and \\is  blind.  Patient received fentanyl 50 and zofran 4 via EMS with no improvement of pain. Patient had dialysis yesterday and sat for full treatment.  Patient continues to moan and groan

## 2021-12-01 NOTE — ED Notes (Signed)
Patient continues to moan and groan and demand pain medication.  Advised we are not doing narcotics as it constipates patient more and she already received fentanyl with no relief.  Patient stated "tylenol does not work for me".

## 2021-12-01 NOTE — ED Provider Notes (Signed)
[facility] Provider Note    Event Date/Time   First MD Initiated Contact with Patient 12/01/21 1052     (approximate)   History   Abdominal Pain   HPI  Robin Rogers is a 81 y.o. female   Past medical history of end-stage renal disease on hemodialysis Tuesday Thursday Saturday, CHF, blindness, hypertension and diabetes presents to the emergency department with ongoing abdominal discomfort and constipation for the last several months.  She has been seen in the emergency department multiple times with negative CT scans and symptomatic relief with GI cocktail and laxatives.  She states that her pain and discomfort are unchanged and if he feels bloating throughout her abdomen and had a bowel movement, small, nonbloody, no melena yesterday.  She denies urinary symptoms.  No fever or chills.  No nausea or vomiting.    History was obtained via patient via [place] interpreter in person. I reviewed an external medical note emergency department dated 11/17/2021 when she was evaluated for ongoing generalized abdominal discomfort and reviewed multiple CT scans at that time preceding and elected not to perform additional CT imaging and instead treat symptoms with GI cocktail which helped and she was discharged.      Physical Exam   Triage Vital Signs: ED Triage Vitals  Enc Vitals Group     BP 12/01/21 1058 (!) 170/119     Pulse Rate 12/01/21 1058 72     Resp 12/01/21 1058 (!) 22     Temp 12/01/21 1058 98.7 F (37.1 C)     Temp Source 12/01/21 1058 Oral     SpO2 12/01/21 1058 100 %     Weight 12/01/21 1057 166 lb (75.3 kg)     Height 12/01/21 1057 4\' 11"  (1.499 m)     Head Circumference --      Peak Flow --      Pain Score 12/01/21 1057 10     Pain Loc --      Pain Edu? --      Excl. in [place]? --     Most recent vital signs: Vitals:   12/01/21 1230 12/01/21 1300  BP: (!) 156/61 (!) 172/56  Pulse: 65 67  Resp: (!) 24 (!) 25  Temp:    SpO2: 99% 97%     General: Awake, no distress.  CV:  Good peripheral perfusion.  Resp:  Normal effort.  Abd:  No distention.  No rigidity or guarding though the patient states discomfort everywhere I press.    ED Results / Procedures / Treatments   Labs (all labs ordered are listed, but only abnormal results are displayed) Labs Reviewed  CBC WITH DIFFERENTIAL/PLATELET - Abnormal; Notable for the following components:      Result Value   WBC 3.7 (*)    RBC 3.10 (*)    Hemoglobin 10.5 (*)    HCT 32.8 (*)    MCV 105.8 (*)    Platelets 148 (*)    Lymphs Abs 0.6 (*)    All other components within normal limits  COMPREHENSIVE METABOLIC PANEL - Abnormal; Notable for the following components:   Potassium 2.9 (*)    Creatinine, Ser 3.32 (*)    Calcium 8.2 (*)    Albumin 3.3 (*)    GFR, Estimated 14 (*)    All other components within normal limits  LIPASE, BLOOD  LACTIC ACID, PLASMA     I reviewed labs and they are notable for pro kalemia with a potassium  2.9, ordered for repletion in the emergency department.  Creatinine is 3.3  EKG  ED ECG REPORT I, Lucillie Garfinkel, the attending physician, personally viewed and interpreted this ECG.   Date: 12/01/2021  EKG Time: 1059  Rate: 72  Rhythm: normal sinus rhythm  Axis: nl  Intervals:long QTC 535  ST&T Change: no acute ischemic changes    RADIOLOGY I independently reviewed and interpreted CT of the abdomen pelvis and see no obstructive infectious pathologies.   PROCEDURES:  Critical Care performed: No  Procedures   MEDICATIONS ORDERED IN ED: Medications  potassium chloride SA (KLOR-CON M) CR tablet 40 mEq (has no administration in time range)  alum & mag hydroxide-simeth (MAALOX/MYLANTA) 200-200-20 MG/5ML suspension 30 mL (30 mLs Oral Given 12/01/21 1107)    And  lidocaine (XYLOCAINE) 2 % viscous mouth solution 15 mL (15 mLs Oral Given 12/01/21 1110)  ondansetron (ZOFRAN) injection 4 mg (4 mg Intravenous Given 12/01/21 1107)   acetaminophen (TYLENOL) tablet 1,000 mg (1,000 mg Oral Given 12/01/21 1107)  famotidine (PEPCID) tablet 20 mg (20 mg Oral Given 12/01/21 1107)  iohexol (OMNIPAQUE) 350 MG/ML injection 75 mL (75 mLs Intravenous Contrast Given 12/01/21 1354)    IMPRESSION / MDM / ASSESSMENT AND PLAN / ED COURSE  I reviewed the triage vital signs and the nursing notes.                              Differential diagnosis includes, but is not limited to, constipation, obstruction, intra-abdominal infection, mesenteric ischemia   The patient is on the cardiac monitor to evaluate for evidence of arrhythmia and/or significant heart rate changes.  MDM: Patient with ongoing abdominal bloating pain most likely constipation, CT scans earlier this month for similar symptoms have been negative for acute pathology.  Given ongoing symptoms and no repeat imaging for the last several weeks, will order CT scan today.  Evaluate for intra-abdominal infection or obstruction.  Will give GI cocktail since these helped with symptoms prior.  If negative, will discharge with GI referral and increased bowel regimen.  Fortunately no acute pathology noted on CT scan of the abdomen pelvis and the patient has been stable in the emergency department.  Symptoms have been chronic at this point, unchanged, and lactic acid was normal so I doubt he is enteric ischemia.  Increased bowel regimen and she has follow-up with GI already established, follow-up as scheduled.    Patient's presentation is most consistent with acute presentation with potential threat to life or bodily function.       FINAL CLINICAL IMPRESSION(S) / ED DIAGNOSES   Final diagnoses:  Generalized abdominal pain  Constipation, unspecified constipation type  Hypokalemia     Rx / DC Orders   ED Discharge Orders          Ordered    polyethylene glycol (MIRALAX) 17 g packet  2 times daily        12/01/21 1415             Note:  This document was prepared  using Dragon voice recognition software and may include unintentional dictation errors.    Lucillie Garfinkel, MD 12/01/21 [phone]

## 2021-12-01 NOTE — ED Notes (Signed)
In own words with interpreter patient is complaining of not being able to have a BM and has taken the pills they gave to her but now is out.  Patient reports only have small pellets of stool.  States "I can't take this fullness anymore".

## 2021-12-01 NOTE — ED Notes (Signed)
Daughter reports pain is better.

## 2021-12-04 ENCOUNTER — Telehealth: Payer: Self-pay | Admitting: *Deleted

## 2021-12-04 NOTE — Telephone Encounter (Signed)
Patient's daughter called our office 2 times today and she was transfer to my phone.  She stated that patient needs an appointment. However, after checking patient's chart, it shows patient needs an new patient appointment for chronic abdominal pain.  Please call patient's daughter as patient speak Spanish.

## 2021-12-09 NOTE — Telephone Encounter (Signed)
This call was transfer to me. Patient's daughter called again and left voicemail when I was on the phone with another patient. She needs to be schedule for a new patient's appointment.  Please call patient's daughter back [phone]

## 2022-01-04 ENCOUNTER — Inpatient Hospital Stay
Admission: EM | Admit: 2022-01-04 | Discharge: 2022-02-06 | DRG: 291 | Disposition: E | Payer: Medicare Other | Attending: Internal Medicine | Admitting: Internal Medicine

## 2022-01-04 ENCOUNTER — Emergency Department: Payer: Medicare Other

## 2022-01-04 ENCOUNTER — Other Ambulatory Visit: Payer: Self-pay

## 2022-01-04 DIAGNOSIS — N186 End stage renal disease: Secondary | ICD-10-CM | POA: Diagnosis not present

## 2022-01-04 DIAGNOSIS — G9341 Metabolic encephalopathy: Secondary | ICD-10-CM | POA: Diagnosis present

## 2022-01-04 DIAGNOSIS — E785 Hyperlipidemia, unspecified: Secondary | ICD-10-CM | POA: Diagnosis present

## 2022-01-04 DIAGNOSIS — D631 Anemia in chronic kidney disease: Secondary | ICD-10-CM | POA: Diagnosis present

## 2022-01-04 DIAGNOSIS — E8770 Fluid overload, unspecified: Secondary | ICD-10-CM | POA: Diagnosis not present

## 2022-01-04 DIAGNOSIS — D696 Thrombocytopenia, unspecified: Secondary | ICD-10-CM | POA: Diagnosis present

## 2022-01-04 DIAGNOSIS — Z66 Do not resuscitate: Secondary | ICD-10-CM | POA: Diagnosis present

## 2022-01-04 DIAGNOSIS — I493 Ventricular premature depolarization: Secondary | ICD-10-CM | POA: Diagnosis present

## 2022-01-04 DIAGNOSIS — E11319 Type 2 diabetes mellitus with unspecified diabetic retinopathy without macular edema: Secondary | ICD-10-CM | POA: Diagnosis present

## 2022-01-04 DIAGNOSIS — I509 Heart failure, unspecified: Secondary | ICD-10-CM

## 2022-01-04 DIAGNOSIS — I132 Hypertensive heart and chronic kidney disease with heart failure and with stage 5 chronic kidney disease, or end stage renal disease: Secondary | ICD-10-CM | POA: Diagnosis not present

## 2022-01-04 DIAGNOSIS — I5043 Acute on chronic combined systolic (congestive) and diastolic (congestive) heart failure: Secondary | ICD-10-CM | POA: Diagnosis present

## 2022-01-04 DIAGNOSIS — N2581 Secondary hyperparathyroidism of renal origin: Secondary | ICD-10-CM | POA: Diagnosis present

## 2022-01-04 DIAGNOSIS — D7589 Other specified diseases of blood and blood-forming organs: Secondary | ICD-10-CM | POA: Diagnosis present

## 2022-01-04 DIAGNOSIS — I10 Essential (primary) hypertension: Secondary | ICD-10-CM | POA: Insufficient documentation

## 2022-01-04 DIAGNOSIS — Z1152 Encounter for screening for COVID-19: Secondary | ICD-10-CM

## 2022-01-04 DIAGNOSIS — N185 Chronic kidney disease, stage 5: Secondary | ICD-10-CM

## 2022-01-04 DIAGNOSIS — I959 Hypotension, unspecified: Secondary | ICD-10-CM | POA: Diagnosis not present

## 2022-01-04 DIAGNOSIS — Z79899 Other long term (current) drug therapy: Secondary | ICD-10-CM

## 2022-01-04 DIAGNOSIS — K219 Gastro-esophageal reflux disease without esophagitis: Secondary | ICD-10-CM | POA: Insufficient documentation

## 2022-01-04 DIAGNOSIS — E876 Hypokalemia: Secondary | ICD-10-CM | POA: Diagnosis present

## 2022-01-04 DIAGNOSIS — E1122 Type 2 diabetes mellitus with diabetic chronic kidney disease: Secondary | ICD-10-CM | POA: Diagnosis present

## 2022-01-04 DIAGNOSIS — R531 Weakness: Secondary | ICD-10-CM

## 2022-01-04 DIAGNOSIS — Z992 Dependence on renal dialysis: Secondary | ICD-10-CM

## 2022-01-04 DIAGNOSIS — Z7982 Long term (current) use of aspirin: Secondary | ICD-10-CM

## 2022-01-04 DIAGNOSIS — R0902 Hypoxemia: Principal | ICD-10-CM | POA: Diagnosis present

## 2022-01-04 LAB — COMPREHENSIVE METABOLIC PANEL
ALT: 16 U/L (ref 0–44)
AST: 21 U/L (ref 15–41)
Albumin: 3.2 g/dL — ABNORMAL LOW (ref 3.5–5.0)
Alkaline Phosphatase: 109 U/L (ref 38–126)
Anion gap: 10 (ref 5–15)
BUN: 5 mg/dL — ABNORMAL LOW (ref 8–23)
CO2: 27 mmol/L (ref 22–32)
Calcium: 8.3 mg/dL — ABNORMAL LOW (ref 8.9–10.3)
Chloride: 98 mmol/L (ref 98–111)
Creatinine, Ser: 1.84 mg/dL — ABNORMAL HIGH (ref 0.44–1.00)
GFR, Estimated: 28 mL/min — ABNORMAL LOW (ref >60)
Glucose, Bld: 98 mg/dL (ref 70–99)
Potassium: 2.7 mmol/L — CL (ref 3.5–5.1)
Sodium: 135 mmol/L (ref 135–145)
Total Bilirubin: 1.3 mg/dL — ABNORMAL HIGH (ref 0.3–1.2)
Total Protein: 6.6 g/dL (ref 6.5–8.1)

## 2022-01-04 LAB — LIPASE, BLOOD: Lipase: 28 U/L (ref 11–51)

## 2022-01-04 LAB — CBC
HCT: 37.7 % (ref 36.0–46.0)
Hemoglobin: 11.7 g/dL — ABNORMAL LOW (ref 12.0–15.0)
MCH: 33.7 pg (ref 26.0–34.0)
MCHC: 31 g/dL (ref 30.0–36.0)
MCV: 108.6 fL — ABNORMAL HIGH (ref 80.0–100.0)
Platelets: 117 10*3/uL — ABNORMAL LOW (ref 150–400)
RBC: 3.47 MIL/uL — ABNORMAL LOW (ref 3.87–5.11)
RDW: 15.3 % (ref 11.5–15.5)
WBC: 3.1 10*3/uL — ABNORMAL LOW (ref 4.0–10.5)
nRBC: 0 % (ref 0.0–0.2)

## 2022-01-04 LAB — BRAIN NATRIURETIC PEPTIDE: B Natriuretic Peptide: 3655.6 pg/mL — ABNORMAL HIGH (ref 0.0–100.0)

## 2022-01-04 LAB — TROPONIN I (HIGH SENSITIVITY): Troponin I (High Sensitivity): 43 ng/L — ABNORMAL HIGH (ref <18)

## 2022-01-04 LAB — MAGNESIUM: Magnesium: 1.8 mg/dL (ref 1.7–2.4)

## 2022-01-04 MED ORDER — ALUM & MAG HYDROXIDE-SIMETH 200-200-20 MG/5ML PO SUSP
30.0000 mL | Freq: Once | ORAL | Status: AC
  Administered 2022-01-04: 30 mL via ORAL
  Filled 2022-01-04: qty 30

## 2022-01-04 MED ORDER — ACETAMINOPHEN 325 MG PO TABS
650.0000 mg | ORAL_TABLET | Freq: Four times a day (QID) | ORAL | Status: DC | PRN
  Administered 2022-01-05: 650 mg via ORAL

## 2022-01-04 MED ORDER — ACETAMINOPHEN 650 MG RE SUPP: 650.0000 mg | Freq: Four times a day (QID) | RECTAL | Status: DC | PRN

## 2022-01-04 MED ORDER — LIDOCAINE VISCOUS HCL 2 % MT SOLN
15.0000 mL | Freq: Once | OROMUCOSAL | Status: AC
  Administered 2022-01-04: 15 mL via ORAL
  Filled 2022-01-04: qty 15

## 2022-01-04 MED ORDER — PANTOPRAZOLE SODIUM 40 MG PO TBEC
40.0000 mg | DELAYED_RELEASE_TABLET | Freq: Every day | ORAL | Status: DC
  Administered 2022-01-05 – 2022-01-06 (×2): 40 mg via ORAL
  Filled 2022-01-04 (×2): qty 1

## 2022-01-04 MED ORDER — TRAZODONE HCL 50 MG PO TABS: 25.0000 mg | ORAL_TABLET | Freq: Every evening | ORAL | Status: DC | PRN

## 2022-01-04 MED ORDER — MAGNESIUM HYDROXIDE 400 MG/5ML PO SUSP: 30.0000 mL | Freq: Every day | ORAL | Status: DC | PRN

## 2022-01-04 MED ORDER — POTASSIUM CHLORIDE 20 MEQ PO PACK
40.0000 meq | PACK | Freq: Two times a day (BID) | ORAL | Status: DC
  Administered 2022-01-04 – 2022-01-06 (×5): 40 meq via ORAL
  Filled 2022-01-04 (×5): qty 2

## 2022-01-04 MED ORDER — PRAVASTATIN SODIUM 40 MG PO TABS
40.0000 mg | ORAL_TABLET | Freq: Every day | ORAL | Status: DC
  Administered 2022-01-05 – 2022-01-06 (×2): 40 mg via ORAL
  Filled 2022-01-04: qty 2
  Filled 2022-01-04: qty 1

## 2022-01-04 MED ORDER — LABETALOL HCL 5 MG/ML IV SOLN: 20.0000 mg | INTRAVENOUS | Status: DC | PRN

## 2022-01-04 MED ORDER — POTASSIUM CHLORIDE 10 MEQ/100ML IV SOLN
10.0000 meq | Freq: Once | INTRAVENOUS | Status: AC
  Administered 2022-01-04: 10 meq via INTRAVENOUS
  Filled 2022-01-04: qty 100

## 2022-01-04 MED ORDER — ONDANSETRON HCL 4 MG PO TABS: 4.0000 mg | ORAL_TABLET | Freq: Four times a day (QID) | ORAL | Status: DC | PRN

## 2022-01-04 MED ORDER — FUROSEMIDE 10 MG/ML IJ SOLN
40.0000 mg | Freq: Two times a day (BID) | INTRAMUSCULAR | Status: DC
  Administered 2022-01-05 – 2022-01-06 (×3): 40 mg via INTRAVENOUS
  Filled 2022-01-04 (×3): qty 4

## 2022-01-04 MED ORDER — POLYETHYLENE GLYCOL 3350 17 G PO PACK
17.0000 g | PACK | Freq: Two times a day (BID) | ORAL | Status: DC
  Administered 2022-01-05 – 2022-01-06 (×3): 17 g via ORAL
  Filled 2022-01-04 (×4): qty 1

## 2022-01-04 MED ORDER — ONDANSETRON HCL 4 MG/2ML IJ SOLN: 4.0000 mg | Freq: Four times a day (QID) | INTRAMUSCULAR | Status: DC | PRN

## 2022-01-04 MED ORDER — ASPIRIN 81 MG PO TBEC
81.0000 mg | DELAYED_RELEASE_TABLET | Freq: Every day | ORAL | Status: DC
  Administered 2022-01-05 – 2022-01-06 (×2): 81 mg via ORAL
  Filled 2022-01-04 (×2): qty 1

## 2022-01-04 MED ORDER — ENOXAPARIN SODIUM 30 MG/0.3ML IJ SOSY
30.0000 mg | PREFILLED_SYRINGE | INTRAMUSCULAR | Status: DC
  Administered 2022-01-05 – 2022-01-06 (×2): 30 mg via SUBCUTANEOUS
  Filled 2022-01-04 (×2): qty 0.3

## 2022-01-04 MED ORDER — DOCUSATE SODIUM 100 MG PO CAPS
100.0000 mg | ORAL_CAPSULE | Freq: Two times a day (BID) | ORAL | Status: DC
  Administered 2022-01-05 – 2022-01-06 (×4): 100 mg via ORAL
  Filled 2022-01-04 (×4): qty 1

## 2022-01-04 MED ORDER — CARVEDILOL 6.25 MG PO TABS
6.2500 mg | ORAL_TABLET | Freq: Two times a day (BID) | ORAL | Status: DC
  Administered 2022-01-05 – 2022-01-06 (×4): 6.25 mg via ORAL
  Filled 2022-01-04 (×4): qty 1

## 2022-01-04 MED ORDER — FUROSEMIDE 10 MG/ML IJ SOLN
20.0000 mg | Freq: Once | INTRAMUSCULAR | Status: AC
  Administered 2022-01-05: 20 mg via INTRAVENOUS
  Filled 2022-01-04: qty 4

## 2022-01-04 MED ORDER — PANTOPRAZOLE SODIUM 40 MG PO TBEC
40.0000 mg | DELAYED_RELEASE_TABLET | Freq: Once | ORAL | Status: AC
  Administered 2022-01-04: 40 mg via ORAL
  Filled 2022-01-04: qty 1

## 2022-01-04 MED ORDER — IOHEXOL 350 MG/ML SOLN
80.0000 mL | Freq: Once | INTRAVENOUS | Status: AC | PRN
  Administered 2022-01-04: 80 mL via INTRAVENOUS

## 2022-01-04 MED ORDER — ONDANSETRON 4 MG PO TBDP: 2.0000 mg | ORAL_TABLET | Freq: Three times a day (TID) | ORAL | Status: DC | PRN

## 2022-01-04 NOTE — H&P (Incomplete)
[place]   PATIENT NAME: Robin Rogers    MR#:  [id number]  DATE OF BIRTH:  1940/09/15  DATE OF ADMISSION:  12/23/2021  PRIMARY CARE PHYSICIAN: [facility] [place]   Patient is coming from: ***  REQUESTING/REFERRING PHYSICIAN: ***  CHIEF COMPLAINT:   Chief Complaint  Patient presents with   Abnormal Lab   Abdominal Pain    HISTORY OF PRESENT ILLNESS:  Robin Rogers is a 81 y.o. female with medical history significant for ***  ED Course: *** EKG as reviewed by me : *** Imaging: *** PAST MEDICAL HISTORY:   Past Medical History:  Diagnosis Date   Anemia of chronic disease    Blindness    Chronic combined systolic (congestive) and diastolic (congestive) heart failure ([place])    a. 09/2017 Echo: EF 45-50%, Gr2 DD; b. 05/2019 Echo: EF 40-45%, Gr III DD (restrictive), nl RV fxn, mod elev PASP. Mod dil LA. Mild MR.   CKD (chronic kidney disease), stage IV (HCC)    Diet-controlled diabetes mellitus ([place])    Hypertension     PAST SURGICAL HISTORY:   Past Surgical History:  Procedure Laterality Date   A/V FISTULAGRAM Left 08/06/2021   Procedure: A/V Fistulagram;  Surgeon: Katha Cabal, MD;  Location: [place] [facility];  Service: Cardiovascular;  Laterality: Left;   AV FISTULA PLACEMENT Left 04/06/2020   Procedure: ARTERIOVENOUS (AV) FISTULA CREATION;  Surgeon: Katha Cabal, MD;  Location: [facility];  Service: Vascular;  Laterality: Left;   CHOLECYSTECTOMY     DIALYSIS/PERMA CATHETER INSERTION N/A 07/25/2019   Procedure: DIALYSIS/PERMA CATHETER INSERTION;  Surgeon: Algernon Huxley, MD;  Location: [place] [facility];  Service: Cardiovascular;  Laterality: N/A;   DIALYSIS/PERMA CATHETER INSERTION N/A 11/03/2019   Procedure: DIALYSIS/PERMA CATHETER INSERTION;  Surgeon: Algernon Huxley, MD;  Location: [place] [facility];  Service: Cardiovascular;  Laterality: N/A;   ESOPHAGOGASTRODUODENOSCOPY N/A 09/24/2017   Procedure:  ESOPHAGOGASTRODUODENOSCOPY (EGD);  Surgeon: Toledo, Benay Pike, MD;  Location: [facility];  Service: Gastroenterology;  Laterality: N/A;    SOCIAL HISTORY:   Social History   Tobacco Use   Smoking status: Never   Smokeless tobacco: Never  Substance Use Topics   Alcohol use: No    FAMILY HISTORY:  History reviewed. No pertinent family history.  DRUG ALLERGIES:  No Known Allergies  REVIEW OF SYSTEMS:   ROS As per history of present illness. All pertinent systems were reviewed above. Constitutional, HEENT, cardiovascular, respiratory, GI, GU, musculoskeletal, neuro, psychiatric, endocrine, integumentary and hematologic systems were reviewed and are otherwise negative/unremarkable except for positive findings mentioned above in the HPI.   MEDICATIONS AT HOME:   Prior to Admission medications   Medication Sig Start Date End Date Taking? Authorizing Provider  aspirin EC 81 MG EC tablet Take 1 tablet (81 mg total) by mouth daily. 05/16/19  Yes Swayze, Ava, DO  carvedilol (COREG) 6.25 MG tablet Take 1 tablet (6.25 mg total) by mouth 2 (two) times daily with a meal. 08/05/19  Yes Loel Dubonnet, NP  docusate sodium (COLACE) 100 MG capsule Take 1 capsule (100 mg total) by mouth 2 (two) times daily. 10/28/21 10/28/22 Yes Lavonia Drafts, MD  ferrous sulfate 325 (65 FE) MG tablet Take 325 mg by mouth 2 (two) times daily with a meal.   Yes [provider]  lovastatin (MEVACOR) 40 MG tablet Take 1 tablet by mouth at bedtime.  01/21/17  Yes [provider]  omeprazole (PRILOSEC) 40 MG capsule Take 40 mg by mouth daily. 11/25/21  Yes [provider]  ondansetron (ZOFRAN-ODT) 4 MG disintegrating tablet Take 2 mg by mouth every 8 (eight) hours as needed. 12/05/21  Yes [provider]  pantoprazole (PROTONIX) 20 MG tablet Take 1 tablet (20 mg total) by mouth daily. 11/17/21 12/08/2021 Yes Vanessa [place], MD  polyethylene glycol (MIRALAX) 17 g packet Take 17 g by  mouth 2 (two) times daily. 12/01/21  Yes Lucillie Garfinkel, MD  Vitamin D, Ergocalciferol, (DRISDOL) 1.25 MG (50000 UNIT) CAPS capsule Take 50,000 Units by mouth every 7 (seven) days. (SATURDAY)   Yes [provider]  GNP GLYCERIN, ADULT, 2.1 g SUPP Place rectally.    [provider]  hydrALAZINE (APRESOLINE) 10 MG tablet Take 1 tablet (10 mg total) by mouth 3 (three) times daily. Patient not taking: Reported on 06/27/2021 08/06/19   Loel Dubonnet, NP      VITAL SIGNS:  Blood pressure (!) 172/61, pulse 60, temperature 98 F (36.7 C), resp. rate 16, height 4\' 9"  (1.448 m), weight 72.6 kg, SpO2 97 %.  PHYSICAL EXAMINATION:  Physical Exam  GENERAL:  81 y.o.-year-old patient lying in the bed with no acute distress.  EYES: Pupils equal, round, reactive to light and accommodation. No scleral icterus. Extraocular muscles intact.  HEENT: Head atraumatic, normocephalic. Oropharynx and nasopharynx clear.  NECK:  Supple, no jugular venous distention. No thyroid enlargement, no tenderness.  LUNGS: Normal breath sounds bilaterally, no wheezing, rales,rhonchi or crepitation. No use of accessory muscles of respiration.  CARDIOVASCULAR: Regular rate and rhythm, S1, S2 normal. No murmurs, rubs, or gallops.  ABDOMEN: Soft, nondistended, nontender. Bowel sounds present. No organomegaly or mass.  EXTREMITIES: No pedal edema, cyanosis, or clubbing.  NEUROLOGIC: Cranial nerves II through XII are intact. Muscle strength 5/5 in all extremities. Sensation intact. Gait not checked.  PSYCHIATRIC: The patient is alert and oriented x 3.  Normal affect and good eye contact. SKIN: No obvious rash, lesion, or ulcer.   LABORATORY PANEL:   CBC Recent Labs  Lab 12/21/2021 1342  WBC 3.1*  HGB 11.7*  HCT 37.7  PLT 117*   ------------------------------------------------------------------------------------------------------------------  Chemistries  Recent Labs  Lab 01/05/2022 1342  NA 135  K 2.7*   CL 98  CO2 27  GLUCOSE 98  BUN 5*  CREATININE 1.84*  CALCIUM 8.3*  MG 1.8  AST 21  ALT 16  ALKPHOS 109  BILITOT 1.3*   ------------------------------------------------------------------------------------------------------------------  Cardiac Enzymes No results for input(s): "TROPONINI" in the last 168 hours. ------------------------------------------------------------------------------------------------------------------  RADIOLOGY:  CT Angio Abd/Pel w/ and/or w/o  Result Date: 12/24/2021 CLINICAL DATA:  Abdominal pain, acute, nonlocalized Upper abdominal pain. Patient's constipation has been treated and she is stooling well. Daughter is worried because her skin over her abdomen is also changing color. EXAM: CTA ABDOMEN AND PELVIS WITHOUT AND WITH CONTRAST TECHNIQUE: Multidetector CT imaging of the abdomen and pelvis was performed using the standard protocol during bolus administration of intravenous contrast. Multiplanar reconstructed images and MIPs were obtained and reviewed to evaluate the vascular anatomy. RADIATION DOSE REDUCTION: This exam was performed according to the departmental dose-optimization program which includes automated exposure control, adjustment of the mA and/or kV according to patient size and/or use of iterative reconstruction technique. CONTRAST:  78mL OMNIPAQUE IOHEXOL 350 MG/ML SOLN COMPARISON:  Multiple priors including CT abdomen pelvis December 01, 2021. FINDINGS: VASCULAR Aorta: Aortic atherosclerosis. Normal caliber aorta without aneurysm, dissection, vasculitis or significant stenosis. Celiac: Patent  without evidence of aneurysm, dissection, vasculitis or significant stenosis. SMA: Patent without evidence of aneurysm, dissection, vasculitis or significant stenosis. Renals: Both renal arteries are patent without evidence of aneurysm, dissection, vasculitis, fibromuscular dysplasia or significant stenosis. IMA: Patent without evidence of aneurysm, dissection,  vasculitis or significant stenosis. Inflow: Patent without evidence of aneurysm, dissection, vasculitis or significant stenosis. Proximal Outflow: Bilateral common femoral and visualized portions of the superficial and profunda femoral arteries are patent without evidence of aneurysm, dissection, vasculitis or significant stenosis. Veins: No obvious venous abnormality within the limitations of this arterial phase study. Review of the MIP images confirms the above findings. NON-VASCULAR Lower chest: Four-chamber cardiomegaly with moderate pericardial effusion unchanged. Right pleural calcifications. Bibasilar atelectasis. Hepatobiliary: Reflux of contrast from the IVC into the hepatic veins. Small hypodensity in the central left lobe of the liver measuring 9 mm on image 29/2 is stable from prior examinations and likely reflects a cyst or hemangioma. Gallbladder surgically absent. No biliary ductal dilation. Pancreas: No pancreatic ductal dilation or evidence of acute inflammation. Spleen: No splenomegaly or focal splenic lesion. Adrenals/Urinary Tract: Bilateral adrenal glands appear normal. No hydronephrosis. Bilateral cortical renal atrophy. Mild wall thickening of a nondistended urinary bladder. Stomach/Bowel: Small hiatal hernia. Stomach is minimally distended limiting evaluation. No pathologic dilation of small or large bowel. The appendix and terminal ileum appear normal. No evidence of acute bowel inflammation. Lymphatic: No pathologically enlarged abdominal or pelvic lymph nodes. Reproductive: Uterus and bilateral adnexa are unremarkable. Other: Trace pelvic free fluid. Similar diffuse subcutaneous edema with fluid layering in the bilateral flanks for instance on image 65/5. No drainable fluid collection. Musculoskeletal: No acute osseous abnormality. Multilevel degenerative change of the spine. IMPRESSION: 1. Similar diffuse subcutaneous edema with fluid layering in the bilateral flanks. No drainable fluid  collection. 2. No acute vascular abnormality within the abdomen or pelvis. 3. Four-chamber cardiomegaly with moderate pericardial effusion unchanged. 4. Reflux of contrast from the IVC into the hepatic veins, suggestive of right heart dysfunction. 5. Mild wall thickening of a nondistended urinary bladder, which may reflect cystitis. Correlate with urinalysis. 6. Small hiatal hernia. 7.  Aortic Atherosclerosis (ICD10-I70.0). Electronically Signed   By: Dahlia Bailiff M.D.   On: 12/18/2021 19:25   DG Chest 2 View  Result Date: 12/23/2021 CLINICAL DATA:  Shortness of breath.  Low potassium in dialysis. EXAM: CHEST - 2 VIEW COMPARISON:  Chest radiographs 09/21/2020 and 08/08/2020; CT chest 08/28/2020 FINDINGS: Left internal jugular dual-lumen central venous catheter tips overlie the mid inferior right atrium, similar to prior. Cardiac silhouette is markedly enlarged, unchanged. Moderate calcification within the aortic arch. Mildly decreased lung volumes. There are unchanged chronic calcific densities overlying the inferolateral right lung, seen to represent calcified pleural plaques on 08/28/2020 CT. Moderate bilateral interstitial thickening is unchanged from prior and chronic. Small posterior costophrenic angle blunting on lateral view, with possible left costophrenic angle blunting on frontal view, although the overlying cardiac silhouette limits this evaluation. No pneumothorax. Old healed fractures of the posterior right ribs. IMPRESSION: 1. Unchanged marked cardiomegaly. 2. Moderate bilateral interstitial thickening, unchanged from prior and chronic. This may represent a combination of interstitial scarring and/or pulmonary edema. 3. Small pleural effusion, favored to be on the left side. Electronically Signed   By: Yvonne Kendall M.D.   On: 12/18/2021 18:28      IMPRESSION AND PLAN:  Assessment and Plan: No notes have been filed under this hospital service. Service: Hospitalist      DVT  prophylaxis: Lovenox***  Advanced Care Planning:  Code Status: full code***  Family Communication:  The plan of care was discussed in details with the patient (and family). I answered all questions. The patient agreed to proceed with the above mentioned plan. Further management will depend upon hospital course. Disposition Plan: Back to previous home environment Consults called: none***  All the records are reviewed and case discussed with ED provider.  Status is: Observation {Observation:23811}   At the time of the admission, it appears that the appropriate admission status for this patient is inpatient.  This is judged to be reasonable and necessary in order to provide the required intensity of service to ensure the patient's safety given the presenting symptoms, physical exam findings and initial radiographic and laboratory data in the context of comorbid conditions.  The patient requires inpatient status due to high intensity of service, high risk of further deterioration and high frequency of surveillance required.  I certify that at the time of admission, it is my clinical judgment that the patient will require inpatient hospital care extending more than 2 midnights.                            Dispo: The patient is from: Home              Anticipated d/c is to: Home              Patient currently is not medically stable to d/c.              Difficult to place patient: No  Christel Mormon M.D on 01/02/2022 at 11:34 PM  [facility]   From 7 PM-7 AM, contact night-coverage [email or web address]  CC: Primary care physician; [facility] [place]

## 2022-01-04 NOTE — ED Provider Notes (Incomplete)
[facility] Provider Note    Event Date/Time   First MD Initiated Contact with Patient 12/26/2021 1656     (approximate)   History   Abnormal Lab and Abdominal Pain   HPI  Robin Rogers is a 81 y.o. female who was sent from dialysis because her potassium was very low.  It is 2.7 here.  Magnesium is within normal limits.  Patient also complaining of upper abdominal pain worse with movement or eating.       Physical Exam   Triage Vital Signs: ED Triage Vitals  Enc Vitals Group     BP 12/25/2021 1336 (!) 185/57     Pulse Rate 12/20/2021 1336 77     Resp 12/16/2021 1336 20     Temp 12/13/2021 1336 99.1 F (37.3 C)     Temp Source 12/13/2021 1336 Oral     SpO2 12/31/2021 1336 92 %     Weight 12/08/2021 1338 160 lb (72.6 kg)     Height 12/31/2021 1338 4\' 9"  (1.448 m)     Head Circumference --      Peak Flow --      Pain Score 12/25/2021 1337 10     Pain Loc --      Pain Edu? --      Excl. in [place]? --     Most recent vital signs: Vitals:   01/01/2022 1336 12/13/2021 1619  BP: (!) 185/57 (!) 172/61  Pulse: 77 60  Resp: 20 16  Temp: 99.1 F (37.3 C) 98 F (36.7 C)  SpO2: 92% 97%     General: Awake, complaining of pain in the upper abdomen CV:  Good peripheral perfusion.  Heart regular rate and rhythm no audible murmurs Resp:  Normal effort.  Lungs sound clear Abd:  Abdomen slightly distended seems to be tender in the upper abdomen which patient confirms skin is slightly darker than the rest of her body. Extremities: Bilateral edema   ED Results / Procedures / Treatments   Labs (all labs ordered are listed, but only abnormal results are displayed) Labs Reviewed  COMPREHENSIVE METABOLIC PANEL - Abnormal; Notable for the following components:      Result Value   Potassium 2.7 (*)    BUN 5 (*)    Creatinine, Ser 1.84 (*)    Calcium 8.3 (*)    Albumin 3.2 (*)    Total Bilirubin 1.3 (*)    GFR, Estimated 28 (*)    All other components within normal limits   CBC - Abnormal; Notable for the following components:   WBC 3.1 (*)    RBC 3.47 (*)    Hemoglobin 11.7 (*)    MCV 108.6 (*)    Platelets 117 (*)    All other components within normal limits  LIPASE, BLOOD  MAGNESIUM  URINALYSIS, ROUTINE W REFLEX MICROSCOPIC     EKG  ***   RADIOLOGY *** {USE THE WORD "INTERPRETED"!! You MUST document your own interpretation of imaging, as well as the fact that you reviewed the radiologist's report!:1}   PROCEDURES:  Critical Care performed: {CriticalCareYesNo:19197::"Yes, see critical care procedure note(s)","No"}  Procedures   MEDICATIONS ORDERED IN ED: Medications  potassium chloride 10 mEq in 100 mL IVPB (has no administration in time range)  alum & mag hydroxide-simeth (MAALOX/MYLANTA) 200-200-20 MG/5ML suspension 30 mL (has no administration in time range)    And  lidocaine (XYLOCAINE) 2 % viscous mouth solution 15 mL (has no administration in time range)  IMPRESSION / MDM / ASSESSMENT AND PLAN / ED COURSE  I reviewed the triage vital signs and the nursing notes.                              Differential diagnosis includes, but is not limited to, ***  Patient's presentation is most consistent with {EM COPA:27473}  *** {If the patient is on the monitor, remove the brackets and asterisks on the sentence below and remember to document it as a Procedure as well. Otherwise delete the sentence below:1} {**The patient is on the cardiac monitor to evaluate for evidence of arrhythmia and/or significant heart rate changes.**} {Remember to include, when applicable, any/all of the following data: independent review of imaging independent review of labs (comment specifically on pertinent positives and negatives) review of specific prior hospitalizations, PCP/specialist notes, etc. discuss meds given and prescribed document any discussion with consultants (including hospitalists) any clinical decision tools you used and why  (PECARN, NEXUS, etc.) did you consider admitting the patient? document social determinants of health affecting patient's care (homelessness, inability to follow up in a timely fashion, etc) document any pre-existing conditions increasing risk on current visit (e.g. diabetes and HTN increasing danger of high-risk chest pain/ACS) describes what meds you gave (especially parenteral) and why any other interventions?:1}     FINAL CLINICAL IMPRESSION(S) / ED DIAGNOSES   Final diagnoses:  None     Rx / DC Orders   ED Discharge Orders     None        Note:  This document was prepared using Dragon voice recognition software and may include unintentional dictation errors.

## 2022-01-04 NOTE — Progress Notes (Signed)
PHARMACIST - PHYSICIAN COMMUNICATION  CONCERNING:  Enoxaparin (Lovenox) for DVT Prophylaxis    RECOMMENDATION: Patient was prescribed enoxaprin 40mg  q24 hours for VTE prophylaxis.   Filed Weights   01/02/2022 1338  Weight: 72.6 kg (160 lb)    Body mass index is 34.62 kg/m.  Estimated Creatinine Clearance: 20.4 mL/min (A) (by C-G formula based on SCr of 1.84 mg/dL (H)).  Patient is candidate for enoxaparin 30mg  every 24 hours based on CrCl <37ml/min or Weight <45kg  DESCRIPTION: Pharmacy has adjusted enoxaparin dose per [facility] policy.  Patient is now receiving enoxaparin 30 mg every 24 hours   Renda Rolls, PharmD, [facility] 12/18/2021 11:33 PM

## 2022-01-04 NOTE — ED Triage Notes (Signed)
Pt with daughter who states pt was at dialysis and her potassium was low- pt is also having left sided abd pain which she has been seen for multiple times before- pt does have bruising on her lower abd- pt daughter states it feels like she has lumps in her abd- pt has had nausea, but no vomiting or diarrhea- pt states when she is moving around her back hurts

## 2022-01-04 NOTE — ED Provider Notes (Incomplete)
[facility] Provider Note    Event Date/Time   First MD Initiated Contact with Patient 12/21/2021 1656     (approximate)   History   Abnormal Lab and Abdominal Pain   HPI  Robin Rogers is a 81 y.o. female who was sent from dialysis because her potassium was very low.  It is 2.7 here.  Magnesium is within normal limits.  Patient also complaining of upper abdominal pain worse with movement or eating.       Physical Exam   Triage Vital Signs: ED Triage Vitals  Enc Vitals Group     BP 12/27/2021 1336 (!) 185/57     Pulse Rate 12/15/2021 1336 77     Resp 01/02/2022 1336 20     Temp 01/03/2022 1336 99.1 F (37.3 C)     Temp Source 12/09/2021 1336 Oral     SpO2 12/18/2021 1336 92 %     Weight 12/16/2021 1338 160 lb (72.6 kg)     Height 12/12/2021 1338 4\' 9"  (1.448 m)     Head Circumference --      Peak Flow --      Pain Score 01/05/2022 1337 10     Pain Loc --      Pain Edu? --      Excl. in [place]? --     Most recent vital signs: Vitals:   12/20/2021 1336 12/31/2021 1619  BP: (!) 185/57 (!) 172/61  Pulse: 77 60  Resp: 20 16  Temp: 99.1 F (37.3 C) 98 F (36.7 C)  SpO2: 92% 97%     General: Awake, complaining of pain in the upper abdomen CV:  Good peripheral perfusion.  Heart regular rate and rhythm no audible murmurs Resp:  Normal effort.  Lungs sound clear Abd:  Abdomen slightly distended seems to be tender in the upper abdomen which patient confirms skin is slightly darker than the rest of her body. Extremities: Bilateral edema   ED Results / Procedures / Treatments   Labs (all labs ordered are listed, but only abnormal results are displayed) Labs Reviewed  COMPREHENSIVE METABOLIC PANEL - Abnormal; Notable for the following components:      Result Value   Potassium 2.7 (*)    BUN 5 (*)    Creatinine, Ser 1.84 (*)    Calcium 8.3 (*)    Albumin 3.2 (*)    Total Bilirubin 1.3 (*)    GFR, Estimated 28 (*)    All other components within normal limits   CBC - Abnormal; Notable for the following components:   WBC 3.1 (*)    RBC 3.47 (*)    Hemoglobin 11.7 (*)    MCV 108.6 (*)    Platelets 117 (*)    All other components within normal limits  BRAIN NATRIURETIC PEPTIDE - Abnormal; Notable for the following components:   B Natriuretic Peptide 3,655.6 (*)    All other components within normal limits  TROPONIN I (HIGH SENSITIVITY) - Abnormal; Notable for the following components:   Troponin I (High Sensitivity) 43 (*)    All other components within normal limits  LIPASE, BLOOD  MAGNESIUM  URINALYSIS, ROUTINE W REFLEX MICROSCOPIC  BASIC METABOLIC PANEL  CBC     EKG  EKG read interpreted by me shows possible accelerated junctional rhythm at a rate of 95 left axis no obvious acute ST-T changes   RADIOLOGY CT abdomen pelvis shows no acute pathology but there is a lot of edema in the  skin and soft tissues radiology read the film and I reviewed and interpreted   PROCEDURES:  Critical Care performed:   Procedures   MEDICATIONS ORDERED IN ED: Medications  potassium chloride (KLOR-CON) packet 40 mEq (40 mEq Oral Given 01/03/2022 2135)  furosemide (LASIX) injection 20 mg (has no administration in time range)  labetalol (NORMODYNE) injection 20 mg (has no administration in time range)  aspirin EC tablet 81 mg (has no administration in time range)  carvedilol (COREG) tablet 6.25 mg (has no administration in time range)  pravastatin (PRAVACHOL) tablet 40 mg (has no administration in time range)  docusate sodium (COLACE) capsule 100 mg (has no administration in time range)  pantoprazole (PROTONIX) EC tablet 40 mg (has no administration in time range)  polyethylene glycol (MIRALAX / GLYCOLAX) packet 17 g (has no administration in time range)  furosemide (LASIX) injection 40 mg (has no administration in time range)  enoxaparin (LOVENOX) injection 30 mg (has no administration in time range)  acetaminophen (TYLENOL) tablet 650 mg (has no  administration in time range)    Or  acetaminophen (TYLENOL) suppository 650 mg (has no administration in time range)  traZODone (DESYREL) tablet 25 mg (has no administration in time range)  magnesium hydroxide (MILK OF MAGNESIA) suspension 30 mL (has no administration in time range)  ondansetron (ZOFRAN) tablet 4 mg (has no administration in time range)    Or  ondansetron (ZOFRAN) injection 4 mg (has no administration in time range)  potassium chloride 10 mEq in 100 mL IVPB (0 mEq Intravenous Stopped 12/06/2021 1909)  alum & mag hydroxide-simeth (MAALOX/MYLANTA) 200-200-20 MG/5ML suspension 30 mL (30 mLs Oral Given 01/05/2022 1746)    And  lidocaine (XYLOCAINE) 2 % viscous mouth solution 15 mL (15 mLs Oral Given 12/17/2021 1746)  iohexol (OMNIPAQUE) 350 MG/ML injection 80 mL (80 mLs Intravenous Contrast Given 12/09/2021 1834)  potassium chloride 10 mEq in 100 mL IVPB (0 mEq Intravenous Stopped 12/07/2021 2058)  pantoprazole (PROTONIX) EC tablet 40 mg (40 mg Oral Given 01/01/2022 2135)     IMPRESSION / MDM / ASSESSMENT AND PLAN / ED COURSE  I reviewed the triage vital signs and the nursing notes. Patient desats when we take her off oxygen and she moves just a little bit in the bed.  She does have a very large heart and CHF on chest x-ray her BNP is very elevated.  Will have to get her in the hospital probably get more dialysis tomorrow.  Differential diagnosis includes, but is not limited to, pneumonia, cardiac disease, CHF  Patient's presentation is most consistent with acute presentation with potential threat to life or bodily function.  The patient is on the cardiac monitor to evaluate for evidence of arrhythmia and/or significant heart rate changes.  None were seen      FINAL CLINICAL IMPRESSION(S) / ED DIAGNOSES   Final diagnoses:  Hypoxia  Congestive heart failure, unspecified HF chronicity, unspecified heart failure type ([place])     Rx / DC Orders   ED Discharge Orders     None         Note:  This document was prepared using Dragon voice recognition software and may include unintentional dictation errors.

## 2022-01-04 NOTE — ED Provider Triage Note (Signed)
Emergency Medicine Provider Triage Evaluation Note  Robin Rogers , a 81 y.o. female  was evaluated in triage.  Pt complains of sent from dialysis for low potassium.  Patient has also been complaining of abdominal pain for the past several months.  Her granddaughter reports that she does not want to eat and drink, also for the past several months.  She has been seen multiple times for this abdominal pain.  The granddaughter does not think that anything is different today.  No fevers or chills.  Patient is moaning in triage which granddaughter also reports has been ongoing for several months.  Completed dialysis today, no missed sessions.  Review of Systems  Positive: Abd pain Negative: Fever, n/v/d  Physical Exam  BP (!) 185/57   Pulse 77   Temp 99.1 F (37.3 C) (Oral)   Resp 20   Ht 4\' 9"  (1.448 m)   Wt 72.6 kg   SpO2 92%   BMI 34.62 kg/m  Gen:   Awake, moaning   Resp:  Normal effort  MSK:   Moves extremities without difficulty  Other:    Medical Decision Making  Medically screening exam initiated at 1:41 PM.  Appropriate orders placed.  Robin Rogers was informed that the remainder of the evaluation will be completed by another provider, this initial triage assessment does not replace that evaluation, and the importance of remaining in the ED until their evaluation is complete.     Marquette Old, PA-C 12/10/2021 1343

## 2022-01-05 DIAGNOSIS — I5043 Acute on chronic combined systolic (congestive) and diastolic (congestive) heart failure: Secondary | ICD-10-CM | POA: Diagnosis present

## 2022-01-05 DIAGNOSIS — N186 End stage renal disease: Secondary | ICD-10-CM

## 2022-01-05 DIAGNOSIS — Z1152 Encounter for screening for COVID-19: Secondary | ICD-10-CM | POA: Diagnosis not present

## 2022-01-05 DIAGNOSIS — I493 Ventricular premature depolarization: Secondary | ICD-10-CM | POA: Diagnosis present

## 2022-01-05 DIAGNOSIS — E11319 Type 2 diabetes mellitus with unspecified diabetic retinopathy without macular edema: Secondary | ICD-10-CM | POA: Diagnosis present

## 2022-01-05 DIAGNOSIS — R0902 Hypoxemia: Secondary | ICD-10-CM | POA: Diagnosis present

## 2022-01-05 DIAGNOSIS — Z7982 Long term (current) use of aspirin: Secondary | ICD-10-CM | POA: Diagnosis not present

## 2022-01-05 DIAGNOSIS — E876 Hypokalemia: Secondary | ICD-10-CM | POA: Diagnosis present

## 2022-01-05 DIAGNOSIS — Z79899 Other long term (current) drug therapy: Secondary | ICD-10-CM | POA: Diagnosis not present

## 2022-01-05 DIAGNOSIS — I132 Hypertensive heart and chronic kidney disease with heart failure and with stage 5 chronic kidney disease, or end stage renal disease: Secondary | ICD-10-CM | POA: Diagnosis present

## 2022-01-05 DIAGNOSIS — I10 Essential (primary) hypertension: Secondary | ICD-10-CM | POA: Insufficient documentation

## 2022-01-05 DIAGNOSIS — E785 Hyperlipidemia, unspecified: Secondary | ICD-10-CM | POA: Diagnosis present

## 2022-01-05 DIAGNOSIS — K219 Gastro-esophageal reflux disease without esophagitis: Secondary | ICD-10-CM | POA: Diagnosis present

## 2022-01-05 DIAGNOSIS — D696 Thrombocytopenia, unspecified: Secondary | ICD-10-CM | POA: Diagnosis present

## 2022-01-05 DIAGNOSIS — R531 Weakness: Secondary | ICD-10-CM | POA: Diagnosis not present

## 2022-01-05 DIAGNOSIS — G9341 Metabolic encephalopathy: Secondary | ICD-10-CM | POA: Diagnosis present

## 2022-01-05 DIAGNOSIS — D7589 Other specified diseases of blood and blood-forming organs: Secondary | ICD-10-CM | POA: Diagnosis present

## 2022-01-05 DIAGNOSIS — I959 Hypotension, unspecified: Secondary | ICD-10-CM | POA: Diagnosis not present

## 2022-01-05 DIAGNOSIS — N2581 Secondary hyperparathyroidism of renal origin: Secondary | ICD-10-CM | POA: Diagnosis present

## 2022-01-05 DIAGNOSIS — E8770 Fluid overload, unspecified: Secondary | ICD-10-CM | POA: Diagnosis present

## 2022-01-05 DIAGNOSIS — Z66 Do not resuscitate: Secondary | ICD-10-CM | POA: Diagnosis present

## 2022-01-05 DIAGNOSIS — E1122 Type 2 diabetes mellitus with diabetic chronic kidney disease: Secondary | ICD-10-CM | POA: Diagnosis present

## 2022-01-05 DIAGNOSIS — D631 Anemia in chronic kidney disease: Secondary | ICD-10-CM | POA: Diagnosis present

## 2022-01-05 DIAGNOSIS — Z992 Dependence on renal dialysis: Secondary | ICD-10-CM | POA: Diagnosis not present

## 2022-01-05 HISTORY — DX: Hypokalemia: E87.6

## 2022-01-05 LAB — BASIC METABOLIC PANEL
Anion gap: 5 (ref 5–15)
BUN: 9 mg/dL (ref 8–23)
CO2: 29 mmol/L (ref 22–32)
Calcium: 8.3 mg/dL — ABNORMAL LOW (ref 8.9–10.3)
Chloride: 103 mmol/L (ref 98–111)
Creatinine, Ser: 2.63 mg/dL — ABNORMAL HIGH (ref 0.44–1.00)
GFR, Estimated: 18 mL/min — ABNORMAL LOW (ref >60)
Glucose, Bld: 86 mg/dL (ref 70–99)
Potassium: 3.9 mmol/L (ref 3.5–5.1)
Sodium: 137 mmol/L (ref 135–145)

## 2022-01-05 LAB — CBC
HCT: 35.4 % — ABNORMAL LOW (ref 36.0–46.0)
Hemoglobin: 10.8 g/dL — ABNORMAL LOW (ref 12.0–15.0)
MCH: 34.2 pg — ABNORMAL HIGH (ref 26.0–34.0)
MCHC: 30.5 g/dL (ref 30.0–36.0)
MCV: 112 fL — ABNORMAL HIGH (ref 80.0–100.0)
Platelets: 114 10*3/uL — ABNORMAL LOW (ref 150–400)
RBC: 3.16 MIL/uL — ABNORMAL LOW (ref 3.87–5.11)
RDW: 15.3 % (ref 11.5–15.5)
WBC: 2.6 10*3/uL — ABNORMAL LOW (ref 4.0–10.5)
nRBC: 0 % (ref 0.0–0.2)

## 2022-01-05 LAB — HEPATITIS B SURFACE ANTIGEN: Hepatitis B Surface Ag: NONREACTIVE

## 2022-01-05 MED ORDER — ACETAMINOPHEN 325 MG PO TABS
ORAL_TABLET | ORAL | Status: AC
  Filled 2022-01-05: qty 2

## 2022-01-05 MED ORDER — HEPARIN SODIUM (PORCINE) 1000 UNIT/ML DIALYSIS: 1000.0000 [IU] | INTRAMUSCULAR | Status: DC | PRN

## 2022-01-05 MED ORDER — ALTEPLASE 2 MG IJ SOLR: 2.0000 mg | Freq: Once | INTRAMUSCULAR | Status: DC | PRN

## 2022-01-05 MED ORDER — ANTICOAGULANT SODIUM CITRATE 4% (200MG/5ML) IV SOLN: 5.0000 mL | Status: DC | PRN

## 2022-01-05 MED ORDER — PENTAFLUOROPROP-TETRAFLUOROETH EX AERO: 1.0000 | INHALATION_SPRAY | CUTANEOUS | Status: DC | PRN

## 2022-01-05 MED ORDER — CHLORHEXIDINE GLUCONATE CLOTH 2 % EX PADS
6.0000 | MEDICATED_PAD | Freq: Every day | CUTANEOUS | Status: DC
  Administered 2022-01-05 – 2022-01-07 (×3): 6 via TOPICAL
  Filled 2022-01-05 (×2): qty 6

## 2022-01-05 MED ORDER — LIDOCAINE-PRILOCAINE 2.5-2.5 % EX CREA: 1.0000 | TOPICAL_CREAM | CUTANEOUS | Status: DC | PRN

## 2022-01-05 MED ORDER — LIDOCAINE HCL (PF) 1 % IJ SOLN: 5.0000 mL | INTRAMUSCULAR | Status: DC | PRN

## 2022-01-05 NOTE — Assessment & Plan Note (Addendum)
Potassium was replaced on admission for K 2.7.  Resolved.  K this AM 3.9. Monitor BMP and Mg level.

## 2022-01-05 NOTE — Assessment & Plan Note (Addendum)
Sliding scale NovoLog

## 2022-01-05 NOTE — Progress Notes (Signed)
Progress Note   Patient: Robin Rogers YIR:[id number] DOB: [birth year] DOA: 12/11/2021     0 DOS: the patient was seen and examined on 01/05/2022   Brief hospital course: TARISHA FADER is a 81 y.o. 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 She was sent to the ED from outpatient hemodialysis after being found hypoxic with spO2 in 80's.   Pertinent ED findings --- BNP elevated 3655.5, labs with K 2.7, Cr 1.84, BUN 5, hs-troponin 43. No acute EKG changes.  CBC with low platelets 117k.  CTA abdomen/pelvis with and without contrast was without acute findings, showed signs of anasarca with subcutaneous edema in bilateral flanks, unchanged moderate pericardial effusion, contrast reflux into hepatic veins suggestive of right heart dysfunction, mild bladder wall thickening.   Admitted to hospital with nephrology consulted for acute on chronic HFpEF with volume overload.    Assessment and Plan: * Fluid overload Acute on chronic combined systolic and diastolic CHF in the setting of ESRD on hemodialysis. - Continue IV Lasix  -- Dialysis per Nephrology  -- Getting dialysis today  ESRD on hemodialysis [facility]) Nephrology following for dialysis.  Hypokalemia Potassium was replaced on admission for K 2.7.  Resolved.  K this AM 3.9. Monitor BMP and Mg level.   GERD without esophagitis Continue PPI   Type 2 diabetes mellitus with chronic kidney disease, without long-term current use of insulin (HCC) Sliding scale NovoLog  Dyslipidemia Continue statin   Essential hypertension Continue home antihypertensives.         Subjective: Pt seen in the ED with several family members at bedside.  Family report patient was confused and restless earlier, pulling at her lines and IV, but they kept her from pulling out the IV.  Patient is quite somnolent but will arouse briefly.  Currently she denies pain or feeling sick.  Just tired.    Physical Exam: Vitals:   01/05/22 1438 01/05/22 1456 01/05/22 1500 01/05/22 1530  BP: (!) 133/52 (!) 134/49 (!) 138/47 (!) 154/54  Pulse: 67 73 71 69  Resp: (!) 21 20 19 18   Temp:      TempSrc:      SpO2: 99% 99% 99% 100%  Weight:      Height:       General exam: appears sleeping comfortably, arouses to combined voice and tactile stimulation, no acute distress HEENT: keeps eyes closed, moist mucus membranes, hearing grossly normal  Respiratory system: CTAB but diminished bases with shallows inspirations, no wheezes, normal respiratory effort. Cardiovascular system: normal S1/S2, RRR, no pedal edema.   Gastrointestinal system: soft, NT, ND. Central nervous system: exam limited by patient somnolence and not following commands Skin: dry, intact, normal temperature Psychiatry: exam limited by somnolence   Data Reviewed:  Notable labs --- K normalized 3.9 from 2.7,  Cr 2.63, Ca 8.3, WBC 2.6, Hbg 10.8, platelets 114  Family Communication: multiple at bedside on rounds  Disposition: Status is: Inpatient Remains inpatient appropriate because: Ongoing encephalopathy, remains volume overloaded on IV diuresis and getting hemodialysis for volume management.    Planned Discharge Destination: Home    Time spent: 45 minutes  Author: Ezekiel Slocumb, DO 01/05/2022 3:51 PM  For on call review [email or web address].

## 2022-01-05 NOTE — ED Notes (Signed)
Assumed care from [place], [place]. Pt resting comfortably in bed at this time. Pt denies any current needs or questions. Call light with in reach. Pt placed on cardiac and SPO2 monitoring.

## 2022-01-05 NOTE — Assessment & Plan Note (Addendum)
Continue home antihypertensives 

## 2022-01-05 NOTE — Assessment & Plan Note (Addendum)
Continue statin. 

## 2022-01-05 NOTE — Progress Notes (Signed)
Pt completed 3.5 hour HD treatment w/ no complications. Alert, vss, report to ED RN. Start: 1239 End: 0149 2050ml fluid removed No HD meds ordered Unable to attain post HD weight d/t ED stretcher scale not functioning now and pt unable to stand.

## 2022-01-05 NOTE — ED Notes (Signed)
Robin Rogers daughter: [phone]

## 2022-01-05 NOTE — Progress Notes (Signed)
Interpreter present for pre hd rn assessment

## 2022-01-05 NOTE — Hospital Course (Signed)
Robin Rogers is a 81 y.o. 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 She was sent to the ED from outpatient hemodialysis after being found hypoxic with spO2 in 80's.   Pertinent ED findings --- BNP elevated 3655.5, labs with K 2.7, Cr 1.84, BUN 5, hs-troponin 43. No acute EKG changes.  CBC with low platelets 117k.  CTA abdomen/pelvis with and without contrast was without acute findings, showed signs of anasarca with subcutaneous edema in bilateral flanks, unchanged moderate pericardial effusion, contrast reflux into hepatic veins suggestive of right heart dysfunction, mild bladder wall thickening.   Admitted to hospital with nephrology consulted for acute on chronic HFpEF with volume overload.

## 2022-01-05 NOTE — Assessment & Plan Note (Addendum)
Continue PPI ?

## 2022-01-05 NOTE — Progress Notes (Signed)
HD tx paused and cartridge changed d/t machine/pump error.

## 2022-01-05 NOTE — Progress Notes (Signed)
Tx resumed. Patient is resting comfortably at this time.

## 2022-01-05 NOTE — ED Notes (Signed)
Patient to dialysis at this time.

## 2022-01-05 NOTE — Progress Notes (Signed)
Post hd rn assessment 

## 2022-01-05 NOTE — Progress Notes (Signed)
[facility]  ROUNDING NOTE   Subjective:   Robin Rogers is a 81 year old female with past medical conditions consistent with type 2 diabetes, combined systolic and diastolic heart failure, diabetic nephropathy/blindness, hypertension, and end-stage renal disease on hemodialysis.  Patient presents to the emergency department with abnormal labs and shortness of breath.  Patient has been admitted under observation for Fluid overload [E87.70]  Patient is known to our practice and receives outpatient dialysis treatments at [facility] on a TTS schedule, supervised by Dr. Candiss Norse.  Patient received full treatment yesterday.  Patient seen and evaluated resting quietly on stretcher in ED.  Patient somnolent.  Family at bedside able to provide limited history.  States patient has not missed any recent dialysis treatments.  Poor appetite reported prior to admission.  Denies nausea or vomiting.  Labs on ED arrival significant for potassium 2.7, BUN 5, creatinine 1.84 with GFR 28, WBCs 3.1, and hemoglobin 11.7.  Chest x-ray shows small pleural effusion.  CT angio abdomen pelvis shows questionable cystitis.  We have been consulted to continue dialysis needs during this admission.   Objective:  Vital signs in last 24 hours:  Temp:  [97.7 F (36.5 C)-99.1 F (37.3 C)] 98.4 F (36.9 C) (12/31 1223) Pulse Rate:  [60-97] 72 (12/31 1223) Resp:  [16-20] 18 (12/31 1223) BP: (122-185)/(48-61) 153/48 (12/31 1223) SpO2:  [92 %-100 %] 99 % (12/31 1223) Weight:  [72.6 kg] 72.6 kg (12/30 1338)  Weight change:  Filed Weights   01/05/2022 1338  Weight: 72.6 kg    Intake/Output: I/O last 3 completed shifts: In: 200 [IV Piggyback:200] Out: -    Intake/Output this shift:  No intake/output data recorded.  Physical Exam: General: NAD  Head: Normocephalic, atraumatic.   Eyes: Anicteric  Lungs:  Crackles, mild tachypnea  Heart: Regular rate and rhythm  Abdomen:  Soft, nontender,  nondistended  Extremities: Trace to 1+ peripheral edema.  Neurologic: Somnolent  Skin: No lesions  Access: Left chest CVC    Basic Metabolic Panel: Recent Labs  Lab 01/02/2022 1342 01/05/22 0534  NA 135 137  K 2.7* 3.9  CL 98 103  CO2 27 29  GLUCOSE 98 86  BUN 5* 9  CREATININE 1.84* 2.63*  CALCIUM 8.3* 8.3*  MG 1.8  --     Liver Function Tests: Recent Labs  Lab 01/05/2022 1342  AST 21  ALT 16  ALKPHOS 109  BILITOT 1.3*  PROT 6.6  ALBUMIN 3.2*   Recent Labs  Lab 12/18/2021 1342  LIPASE 28   No results for input(s): "AMMONIA" in the last 168 hours.  CBC: Recent Labs  Lab 12/18/2021 1342 01/05/22 0535  WBC 3.1* 2.6*  HGB 11.7* 10.8*  HCT 37.7 35.4*  MCV 108.6* 112.0*  PLT 117* 114*    Cardiac Enzymes: No results for input(s): "CKTOTAL", "CKMB", "CKMBINDEX", "TROPONINI" in the last 168 hours.  BNP: Invalid input(s): "POCBNP"  CBG: No results for input(s): "GLUCAP" in the last 168 hours.  Microbiology: Results for orders placed or performed during the hospital encounter of 09/21/20  Resp Panel by RT-PCR (Flu A&B, Covid) Nasopharyngeal Swab     Status: None   Collection Time: 09/21/20  1:11 PM   Specimen: Nasopharyngeal Swab; Nasopharyngeal(NP) swabs in vial transport medium  Result Value Ref Range Status   SARS Coronavirus 2 by RT PCR NEGATIVE NEGATIVE Final    Comment: (NOTE) SARS-CoV-2 target nucleic acids are NOT DETECTED.  The SARS-CoV-2 RNA is generally detectable in upper respiratory specimens  during the acute phase of infection. The lowest concentration of SARS-CoV-2 viral copies this assay can detect is 138 copies/mL. A negative result does not preclude SARS-Cov-2 infection and should not be used as the sole basis for treatment or other patient management decisions. A negative result may occur with  improper specimen collection/handling, submission of specimen other than nasopharyngeal swab, presence of viral mutation(s) within the areas  targeted by this assay, and inadequate number of viral copies(<138 copies/mL). A negative result must be combined with clinical observations, patient history, and epidemiological information. The expected result is Negative.  Fact Sheet for Patients:  EntrepreneurPulse.com.au  Fact Sheet for Healthcare Providers:  IncredibleEmployment.be  This test is no t yet approved or cleared by the [place] FDA and  has been authorized for detection and/or diagnosis of SARS-CoV-2 by FDA under an Emergency Use Authorization (EUA). This EUA will remain  in effect (meaning this test can be used) for the duration of the COVID-19 declaration under Section 564(b)(1) of the Act, 21 U.S.C.section 360bbb-3(b)(1), unless the authorization is terminated  or revoked sooner.       Influenza A by PCR NEGATIVE NEGATIVE Final   Influenza B by PCR NEGATIVE NEGATIVE Final    Comment: (NOTE) The Xpert Xpress SARS-CoV-2/FLU/RSV plus assay is intended as an aid in the diagnosis of influenza from Nasopharyngeal swab specimens and should not be used as a sole basis for treatment. Nasal washings and aspirates are unacceptable for Xpert Xpress SARS-CoV-2/FLU/RSV testing.  Fact Sheet for Patients: EntrepreneurPulse.com.au  Fact Sheet for Healthcare Providers: IncredibleEmployment.be  This test is not yet approved or cleared by the [place] FDA and has been authorized for detection and/or diagnosis of SARS-CoV-2 by FDA under an Emergency Use Authorization (EUA). This EUA will remain in effect (meaning this test can be used) for the duration of the COVID-19 declaration under Section 564(b)(1) of the Act, 21 U.S.C. section 360bbb-3(b)(1), unless the authorization is terminated or revoked.  Performed at [facility], [address] [place], [place] [address]     Coagulation Studies: No results for input(s): "LABPROT",  "INR" in the last 72 hours.  Urinalysis: No results for input(s): "COLORURINE", "LABSPEC", "PHURINE", "GLUCOSEU", "HGBUR", "BILIRUBINUR", "KETONESUR", "PROTEINUR", "UROBILINOGEN", "NITRITE", "LEUKOCYTESUR" in the last 72 hours.  Invalid input(s): "APPERANCEUR"    Imaging: CT Angio Abd/Pel w/ and/or w/o  Result Date: 12/09/2021 CLINICAL DATA:  Abdominal pain, acute, nonlocalized Upper abdominal pain. Patient's constipation has been treated and she is stooling well. Daughter is worried because her skin over her abdomen is also changing color. EXAM: CTA ABDOMEN AND PELVIS WITHOUT AND WITH CONTRAST TECHNIQUE: Multidetector CT imaging of the abdomen and pelvis was performed using the standard protocol during bolus administration of intravenous contrast. Multiplanar reconstructed images and MIPs were obtained and reviewed to evaluate the vascular anatomy. RADIATION DOSE REDUCTION: This exam was performed according to the departmental dose-optimization program which includes automated exposure control, adjustment of the mA and/or kV according to patient size and/or use of iterative reconstruction technique. CONTRAST:  12mL OMNIPAQUE IOHEXOL 350 MG/ML SOLN COMPARISON:  Multiple priors including CT abdomen pelvis December 01, 2021. FINDINGS: VASCULAR Aorta: Aortic atherosclerosis. Normal caliber aorta without aneurysm, dissection, vasculitis or significant stenosis. Celiac: Patent without evidence of aneurysm, dissection, vasculitis or significant stenosis. SMA: Patent without evidence of aneurysm, dissection, vasculitis or significant stenosis. Renals: Both renal arteries are patent without evidence of aneurysm, dissection, vasculitis, fibromuscular dysplasia or significant stenosis. IMA: Patent without evidence of aneurysm, dissection, vasculitis or  significant stenosis. Inflow: Patent without evidence of aneurysm, dissection, vasculitis or significant stenosis. Proximal Outflow: Bilateral common femoral and  visualized portions of the superficial and profunda femoral arteries are patent without evidence of aneurysm, dissection, vasculitis or significant stenosis. Veins: No obvious venous abnormality within the limitations of this arterial phase study. Review of the MIP images confirms the above findings. NON-VASCULAR Lower chest: Four-chamber cardiomegaly with moderate pericardial effusion unchanged. Right pleural calcifications. Bibasilar atelectasis. Hepatobiliary: Reflux of contrast from the IVC into the hepatic veins. Small hypodensity in the central left lobe of the liver measuring 9 mm on image 29/2 is stable from prior examinations and likely reflects a cyst or hemangioma. Gallbladder surgically absent. No biliary ductal dilation. Pancreas: No pancreatic ductal dilation or evidence of acute inflammation. Spleen: No splenomegaly or focal splenic lesion. Adrenals/Urinary Tract: Bilateral adrenal glands appear normal. No hydronephrosis. Bilateral cortical renal atrophy. Mild wall thickening of a nondistended urinary bladder. Stomach/Bowel: Small hiatal hernia. Stomach is minimally distended limiting evaluation. No pathologic dilation of small or large bowel. The appendix and terminal ileum appear normal. No evidence of acute bowel inflammation. Lymphatic: No pathologically enlarged abdominal or pelvic lymph nodes. Reproductive: Uterus and bilateral adnexa are unremarkable. Other: Trace pelvic free fluid. Similar diffuse subcutaneous edema with fluid layering in the bilateral flanks for instance on image 65/5. No drainable fluid collection. Musculoskeletal: No acute osseous abnormality. Multilevel degenerative change of the spine. IMPRESSION: 1. Similar diffuse subcutaneous edema with fluid layering in the bilateral flanks. No drainable fluid collection. 2. No acute vascular abnormality within the abdomen or pelvis. 3. Four-chamber cardiomegaly with moderate pericardial effusion unchanged. 4. Reflux of contrast from  the IVC into the hepatic veins, suggestive of right heart dysfunction. 5. Mild wall thickening of a nondistended urinary bladder, which may reflect cystitis. Correlate with urinalysis. 6. Small hiatal hernia. 7.  Aortic Atherosclerosis (ICD10-I70.0). Electronically Signed   By: Dahlia Bailiff M.D.   On: 12/11/2021 19:25   DG Chest 2 View  Result Date: 12/29/2021 CLINICAL DATA:  Shortness of breath.  Low potassium in dialysis. EXAM: CHEST - 2 VIEW COMPARISON:  Chest radiographs 09/21/2020 and 08/08/2020; CT chest 08/28/2020 FINDINGS: Left internal jugular dual-lumen central venous catheter tips overlie the mid inferior right atrium, similar to prior. Cardiac silhouette is markedly enlarged, unchanged. Moderate calcification within the aortic arch. Mildly decreased lung volumes. There are unchanged chronic calcific densities overlying the inferolateral right lung, seen to represent calcified pleural plaques on 08/28/2020 CT. Moderate bilateral interstitial thickening is unchanged from prior and chronic. Small posterior costophrenic angle blunting on lateral view, with possible left costophrenic angle blunting on frontal view, although the overlying cardiac silhouette limits this evaluation. No pneumothorax. Old healed fractures of the posterior right ribs. IMPRESSION: 1. Unchanged marked cardiomegaly. 2. Moderate bilateral interstitial thickening, unchanged from prior and chronic. This may represent a combination of interstitial scarring and/or pulmonary edema. 3. Small pleural effusion, favored to be on the left side. Electronically Signed   By: Yvonne Kendall M.D.   On: 12/20/2021 18:28     Medications:    anticoagulant sodium citrate      aspirin EC  81 mg Oral Daily   carvedilol  6.25 mg Oral BID WC   Chlorhexidine Gluconate Cloth  6 each Topical Q0600   docusate sodium  100 mg Oral BID   enoxaparin (LOVENOX) injection  30 mg Subcutaneous Q24H   furosemide  40 mg Intravenous Q12H   pantoprazole   40 mg Oral Daily  polyethylene glycol  17 g Oral BID   potassium chloride  40 mEq Oral BID   pravastatin  40 mg Oral q1800   acetaminophen **OR** acetaminophen, alteplase, anticoagulant sodium citrate, heparin, labetalol, lidocaine (PF), lidocaine-prilocaine, magnesium hydroxide, ondansetron **OR** ondansetron (ZOFRAN) IV, pentafluoroprop-tetrafluoroeth, traZODone  Assessment/ Plan:  Robin Rogers is a 81 y.o.  female with past medical conditions consistent with type 2 diabetes, combined systolic and diastolic heart failure, diabetic nephropathy/blindness, hypertension, and end-stage renal disease on hemodialysis.  Patient presents to the emergency department with abnormal labs and shortness of breath.  Patient has been admitted under observation for Fluid overload [E87.70]  [facility]/TTS/left chest PermCath  Fluid volume overload with end-stage renal disease on hemodialysis.  Chest x-ray shows some pulmonary edema.  Last dialysis treatment completed on Saturday, prior to ED arrival.  Will dialyze patient today, UF goal 2 L as tolerated.  Next treatment scheduled for Tuesday.  2.  Hypokalemia,Potassium 2.7 on ED arrival.  IV supplementation ordered by primary team.  Potassium corrected to 3.9.   3. Anemia of chronic kidney disease Lab Results  Component Value Date   HGB 10.8 (L) 01/05/2022    Hemoglobin above target.  Patient receives Mircera at outpatient clinic.  4. Secondary Hyperparathyroidism: with outpatient labs: PTH 338, phosphorus 4.9, calcium 8.2 on 07/16/21.   Lab Results  Component Value Date   PTH 200 (H) 05/15/2019   PTH Comment 05/15/2019   CALCIUM 8.3 (L) 01/05/2022   CAION 1.10 (L) 04/06/2020   PHOS 3.8 07/26/2019    Ergocalciferol ordered outpatient.  Calcium within goal.  Will continue to monitor.  5.  Hypertension with chronic kidney disease.  Home regimen includes carvedilol and hydralazine.  Currently prescribed carvedilol and furosemide.   Blood pressure stable for this patient.   LOS: 0 Jovi Zavadil 12/31/202312:32 PM

## 2022-01-05 NOTE — Assessment & Plan Note (Addendum)
Acute on chronic combined systolic and diastolic CHF in the setting of ESRD on hemodialysis. -- Dialysis per Nephrology  -- 2 L removed with HD yesterday

## 2022-01-05 NOTE — Assessment & Plan Note (Addendum)
Nephrology following for dialysis.

## 2022-01-05 NOTE — Progress Notes (Signed)
Pre hd rn assessment, interpreter present

## 2022-01-05 NOTE — Assessment & Plan Note (Signed)
-   We will continue her PPI therapy.

## 2022-01-06 DIAGNOSIS — R531 Weakness: Secondary | ICD-10-CM | POA: Diagnosis not present

## 2022-01-06 DIAGNOSIS — E1122 Type 2 diabetes mellitus with diabetic chronic kidney disease: Secondary | ICD-10-CM | POA: Diagnosis not present

## 2022-01-06 DIAGNOSIS — N186 End stage renal disease: Secondary | ICD-10-CM | POA: Diagnosis not present

## 2022-01-06 DIAGNOSIS — E876 Hypokalemia: Secondary | ICD-10-CM | POA: Diagnosis not present

## 2022-01-06 LAB — CBC
HCT: 33.4 % — ABNORMAL LOW (ref 36.0–46.0)
Hemoglobin: 10.1 g/dL — ABNORMAL LOW (ref 12.0–15.0)
MCH: 34.4 pg — ABNORMAL HIGH (ref 26.0–34.0)
MCHC: 30.2 g/dL (ref 30.0–36.0)
MCV: 113.6 fL — ABNORMAL HIGH (ref 80.0–100.0)
Platelets: 104 10*3/uL — ABNORMAL LOW (ref 150–400)
RBC: 2.94 MIL/uL — ABNORMAL LOW (ref 3.87–5.11)
RDW: 15.2 % (ref 11.5–15.5)
WBC: 2.7 10*3/uL — ABNORMAL LOW (ref 4.0–10.5)
nRBC: 0 % (ref 0.0–0.2)

## 2022-01-06 LAB — BASIC METABOLIC PANEL
Anion gap: 5 (ref 5–15)
BUN: 6 mg/dL — ABNORMAL LOW (ref 8–23)
CO2: 31 mmol/L (ref 22–32)
Calcium: 7.8 mg/dL — ABNORMAL LOW (ref 8.9–10.3)
Chloride: 99 mmol/L (ref 98–111)
Creatinine, Ser: 2.16 mg/dL — ABNORMAL HIGH (ref 0.44–1.00)
GFR, Estimated: 23 mL/min — ABNORMAL LOW (ref >60)
Glucose, Bld: 92 mg/dL (ref 70–99)
Potassium: 3.7 mmol/L (ref 3.5–5.1)
Sodium: 135 mmol/L (ref 135–145)

## 2022-01-06 LAB — GLUCOSE, CAPILLARY
Glucose-Capillary: 88 mg/dL (ref 70–99)
Glucose-Capillary: 92 mg/dL (ref 70–99)

## 2022-01-06 LAB — MAGNESIUM: Magnesium: 1.7 mg/dL (ref 1.7–2.4)

## 2022-01-06 NOTE — Progress Notes (Signed)
White labels sent with date and time of collection, and RN initials as [employer] will not print labels at this time.

## 2022-01-06 NOTE — Assessment & Plan Note (Signed)
PT/OT evaluations SNF/rehab recommended Fall precautions

## 2022-01-06 NOTE — Progress Notes (Signed)
[facility]  ROUNDING NOTE   Subjective:   Robin Rogers is a 82 year old female with past medical conditions consistent with type 2 diabetes, combined systolic and diastolic heart failure, diabetic nephropathy/blindness, hypertension, and end-stage renal disease on hemodialysis.  Patient presents to the emergency department with abnormal labs and shortness of breath.  Patient has been admitted under observation for Hypokalemia [E87.6] Hypoxia [R09.02] Fluid overload [E87.70] Congestive heart failure, unspecified HF chronicity, unspecified heart failure type [facility]) [I50.9]  Patient is known to our practice and receives outpatient dialysis treatments at [facility] on a TTS schedule, supervised by Dr. Candiss Norse.    Patient seen resting quietly, family at bedside Appears more comfortable today Remains on 2 L nasal cannula Lower extremity edema slightly improved but remains   Objective:  Vital signs in last 24 hours:  Temp:  [97.7 F (36.5 C)-98.5 F (36.9 C)] 98.5 F (36.9 C) (12/31 2134) Pulse Rate:  [66-101] 93 (01/01 0630) Resp:  [12-29] 12 (01/01 0110) BP: (93-154)/(36-80) 117/58 (01/01 0640) SpO2:  [98 %-100 %] 100 % (01/01 0630) Weight:  [72.6 kg] 72.6 kg (12/31 1240)  Weight change: 0.024 kg Filed Weights   12/16/2021 1338 01/05/22 1240  Weight: 72.6 kg 72.6 kg    Intake/Output: I/O last 3 completed shifts: In: 200 [IV Piggyback:200] Out: 2000 [Other:2000]   Intake/Output this shift:  No intake/output data recorded.  Physical Exam: General: NAD  Head: Normocephalic, atraumatic.   Eyes: Anicteric  Lungs:  Fine crackles, NCO2  Heart: Regular rate and rhythm  Abdomen:  Soft, nontender, nondistended  Extremities: 1+ peripheral edema.  Neurologic: Arousable  Skin: No lesions  Access: Left chest CVC    Basic Metabolic Panel: Recent Labs  Lab 12/08/2021 1342 01/05/22 0534 01/06/22 0628  NA 135 137 135  K 2.7* 3.9 3.7  CL 98 103 99  CO2 27 29  31   GLUCOSE 98 86 92  BUN 5* 9 6*  CREATININE 1.84* 2.63* 2.16*  CALCIUM 8.3* 8.3* 7.8*  MG 1.8  --  1.7     Liver Function Tests: Recent Labs  Lab 12/12/2021 1342  AST 21  ALT 16  ALKPHOS 109  BILITOT 1.3*  PROT 6.6  ALBUMIN 3.2*    Recent Labs  Lab 12/14/2021 1342  LIPASE 28    No results for input(s): "AMMONIA" in the last 168 hours.  CBC: Recent Labs  Lab 12/17/2021 1342 01/05/22 0535 01/06/22 0628  WBC 3.1* 2.6* 2.7*  HGB 11.7* 10.8* 10.1*  HCT 37.7 35.4* 33.4*  MCV 108.6* 112.0* 113.6*  PLT 117* 114* 104*     Cardiac Enzymes: No results for input(s): "CKTOTAL", "CKMB", "CKMBINDEX", "TROPONINI" in the last 168 hours.  BNP: Invalid input(s): "POCBNP"  CBG: No results for input(s): "GLUCAP" in the last 168 hours.  Microbiology: Results for orders placed or performed during the hospital encounter of 09/21/20  Resp Panel by RT-PCR (Flu A&B, Covid) Nasopharyngeal Swab     Status: None   Collection Time: 09/21/20  1:11 PM   Specimen: Nasopharyngeal Swab; Nasopharyngeal(NP) swabs in vial transport medium  Result Value Ref Range Status   SARS Coronavirus 2 by RT PCR NEGATIVE NEGATIVE Final    Comment: (NOTE) SARS-CoV-2 target nucleic acids are NOT DETECTED.  The SARS-CoV-2 RNA is generally detectable in upper respiratory specimens during the acute phase of infection. The lowest concentration of SARS-CoV-2 viral copies this assay can detect is 138 copies/mL. A negative result does not preclude SARS-Cov-2 infection and should not  be used as the sole basis for treatment or other patient management decisions. A negative result may occur with  improper specimen collection/handling, submission of specimen other than nasopharyngeal swab, presence of viral mutation(s) within the areas targeted by this assay, and inadequate number of viral copies(<138 copies/mL). A negative result must be combined with clinical observations, patient history, and  epidemiological information. The expected result is Negative.  Fact Sheet for Patients:  EntrepreneurPulse.com.au  Fact Sheet for Healthcare Providers:  IncredibleEmployment.be  This test is no t yet approved or cleared by the [place] FDA and  has been authorized for detection and/or diagnosis of SARS-CoV-2 by FDA under an Emergency Use Authorization (EUA). This EUA will remain  in effect (meaning this test can be used) for the duration of the COVID-19 declaration under Section 564(b)(1) of the Act, 21 U.S.C.section 360bbb-3(b)(1), unless the authorization is terminated  or revoked sooner.       Influenza A by PCR NEGATIVE NEGATIVE Final   Influenza B by PCR NEGATIVE NEGATIVE Final    Comment: (NOTE) The Xpert Xpress SARS-CoV-2/FLU/RSV plus assay is intended as an aid in the diagnosis of influenza from Nasopharyngeal swab specimens and should not be used as a sole basis for treatment. Nasal washings and aspirates are unacceptable for Xpert Xpress SARS-CoV-2/FLU/RSV testing.  Fact Sheet for Patients: EntrepreneurPulse.com.au  Fact Sheet for Healthcare Providers: IncredibleEmployment.be  This test is not yet approved or cleared by the [place] FDA and has been authorized for detection and/or diagnosis of SARS-CoV-2 by FDA under an Emergency Use Authorization (EUA). This EUA will remain in effect (meaning this test can be used) for the duration of the COVID-19 declaration under Section 564(b)(1) of the Act, 21 U.S.C. section 360bbb-3(b)(1), unless the authorization is terminated or revoked.  Performed at [facility], [address] [place], [place] [address]     Coagulation Studies: No results for input(s): "LABPROT", "INR" in the last 72 hours.  Urinalysis: No results for input(s): "COLORURINE", "LABSPEC", "PHURINE", "GLUCOSEU", "HGBUR", "BILIRUBINUR", "KETONESUR",  "PROTEINUR", "UROBILINOGEN", "NITRITE", "LEUKOCYTESUR" in the last 72 hours.  Invalid input(s): "APPERANCEUR"    Imaging: CT Angio Abd/Pel w/ and/or w/o  Result Date: 12/13/2021 CLINICAL DATA:  Abdominal pain, acute, nonlocalized Upper abdominal pain. Patient's constipation has been treated and she is stooling well. Daughter is worried because her skin over her abdomen is also changing color. EXAM: CTA ABDOMEN AND PELVIS WITHOUT AND WITH CONTRAST TECHNIQUE: Multidetector CT imaging of the abdomen and pelvis was performed using the standard protocol during bolus administration of intravenous contrast. Multiplanar reconstructed images and MIPs were obtained and reviewed to evaluate the vascular anatomy. RADIATION DOSE REDUCTION: This exam was performed according to the departmental dose-optimization program which includes automated exposure control, adjustment of the mA and/or kV according to patient size and/or use of iterative reconstruction technique. CONTRAST:  52mL OMNIPAQUE IOHEXOL 350 MG/ML SOLN COMPARISON:  Multiple priors including CT abdomen pelvis December 01, 2021. FINDINGS: VASCULAR Aorta: Aortic atherosclerosis. Normal caliber aorta without aneurysm, dissection, vasculitis or significant stenosis. Celiac: Patent without evidence of aneurysm, dissection, vasculitis or significant stenosis. SMA: Patent without evidence of aneurysm, dissection, vasculitis or significant stenosis. Renals: Both renal arteries are patent without evidence of aneurysm, dissection, vasculitis, fibromuscular dysplasia or significant stenosis. IMA: Patent without evidence of aneurysm, dissection, vasculitis or significant stenosis. Inflow: Patent without evidence of aneurysm, dissection, vasculitis or significant stenosis. Proximal Outflow: Bilateral common femoral and visualized portions of the superficial and profunda femoral arteries are patent without  evidence of aneurysm, dissection, vasculitis or significant  stenosis. Veins: No obvious venous abnormality within the limitations of this arterial phase study. Review of the MIP images confirms the above findings. NON-VASCULAR Lower chest: Four-chamber cardiomegaly with moderate pericardial effusion unchanged. Right pleural calcifications. Bibasilar atelectasis. Hepatobiliary: Reflux of contrast from the IVC into the hepatic veins. Small hypodensity in the central left lobe of the liver measuring 9 mm on image 29/2 is stable from prior examinations and likely reflects a cyst or hemangioma. Gallbladder surgically absent. No biliary ductal dilation. Pancreas: No pancreatic ductal dilation or evidence of acute inflammation. Spleen: No splenomegaly or focal splenic lesion. Adrenals/Urinary Tract: Bilateral adrenal glands appear normal. No hydronephrosis. Bilateral cortical renal atrophy. Mild wall thickening of a nondistended urinary bladder. Stomach/Bowel: Small hiatal hernia. Stomach is minimally distended limiting evaluation. No pathologic dilation of small or large bowel. The appendix and terminal ileum appear normal. No evidence of acute bowel inflammation. Lymphatic: No pathologically enlarged abdominal or pelvic lymph nodes. Reproductive: Uterus and bilateral adnexa are unremarkable. Other: Trace pelvic free fluid. Similar diffuse subcutaneous edema with fluid layering in the bilateral flanks for instance on image 65/5. No drainable fluid collection. Musculoskeletal: No acute osseous abnormality. Multilevel degenerative change of the spine. IMPRESSION: 1. Similar diffuse subcutaneous edema with fluid layering in the bilateral flanks. No drainable fluid collection. 2. No acute vascular abnormality within the abdomen or pelvis. 3. Four-chamber cardiomegaly with moderate pericardial effusion unchanged. 4. Reflux of contrast from the IVC into the hepatic veins, suggestive of right heart dysfunction. 5. Mild wall thickening of a nondistended urinary bladder, which may reflect  cystitis. Correlate with urinalysis. 6. Small hiatal hernia. 7.  Aortic Atherosclerosis (ICD10-I70.0). Electronically Signed   By: Dahlia Bailiff M.D.   On: 12/16/2021 19:25   DG Chest 2 View  Result Date: 12/14/2021 CLINICAL DATA:  Shortness of breath.  Low potassium in dialysis. EXAM: CHEST - 2 VIEW COMPARISON:  Chest radiographs 09/21/2020 and 08/08/2020; CT chest 08/28/2020 FINDINGS: Left internal jugular dual-lumen central venous catheter tips overlie the mid inferior right atrium, similar to prior. Cardiac silhouette is markedly enlarged, unchanged. Moderate calcification within the aortic arch. Mildly decreased lung volumes. There are unchanged chronic calcific densities overlying the inferolateral right lung, seen to represent calcified pleural plaques on 08/28/2020 CT. Moderate bilateral interstitial thickening is unchanged from prior and chronic. Small posterior costophrenic angle blunting on lateral view, with possible left costophrenic angle blunting on frontal view, although the overlying cardiac silhouette limits this evaluation. No pneumothorax. Old healed fractures of the posterior right ribs. IMPRESSION: 1. Unchanged marked cardiomegaly. 2. Moderate bilateral interstitial thickening, unchanged from prior and chronic. This may represent a combination of interstitial scarring and/or pulmonary edema. 3. Small pleural effusion, favored to be on the left side. Electronically Signed   By: Yvonne Kendall M.D.   On: 12/16/2021 18:28     Medications:      aspirin EC  81 mg Oral Daily   carvedilol  6.25 mg Oral BID WC   Chlorhexidine Gluconate Cloth  6 each Topical Q0600   docusate sodium  100 mg Oral BID   enoxaparin (LOVENOX) injection  30 mg Subcutaneous Q24H   pantoprazole  40 mg Oral Daily   polyethylene glycol  17 g Oral BID   potassium chloride  40 mEq Oral BID   pravastatin  40 mg Oral q1800   acetaminophen **OR** acetaminophen, labetalol, magnesium hydroxide, ondansetron **OR**  ondansetron (ZOFRAN) IV, traZODone  Assessment/ Plan:  Ms. Shamonique  JASZMINE NAVEJAS is a 82 y.o.  female with past medical conditions consistent with type 2 diabetes, combined systolic and diastolic heart failure, diabetic nephropathy/blindness, hypertension, and end-stage renal disease on hemodialysis.  Patient presents to the emergency department with abnormal labs and shortness of breath.  Patient has been admitted under observation for Hypokalemia [E87.6] Hypoxia [R09.02] Fluid overload [E87.70] Congestive heart failure, unspecified HF chronicity, unspecified heart failure type ([place]) [I50.9]  [facility]/TTS/left chest PermCath  Fluid volume overload with end-stage renal disease on hemodialysis.  Chest x-ray shows some pulmonary edema.  Received dialysis treatment yesterday, UF 2 L achieved. Next treatment scheduled for Tuesday.  2.  Hypokalemia,Potassium 2.7 on ED arrival.  Corrected to 3.7 with dialysis and IV supplementation.  Remains on potassium 40 mill equivalents oral supplementation twice daily.  3. Anemia of chronic kidney disease Lab Results  Component Value Date   HGB 10.1 (L) 01/06/2022    Patient receives Mircera at outpatient clinic.  Hemoglobin stable at this time.  4. Secondary Hyperparathyroidism: with outpatient labs: PTH 338, phosphorus 4.9, calcium 8.2 on 07/16/21.   Lab Results  Component Value Date   PTH 200 (H) 05/15/2019   PTH Comment 05/15/2019   CALCIUM 7.8 (L) 01/06/2022   CAION 1.10 (L) 04/06/2020   PHOS 3.8 07/26/2019    Ergocalciferol ordered outpatient.  Calcium slightly decreased.  May require continuation of vitamin D supplementation.  5.  Hypertension with chronic kidney disease.  Home regimen includes carvedilol and hydralazine.  Currently prescribed carvedilol.  Furosemide held due to soft blood pressures..  Blood pressure 117/58 this morning.   LOS: 1 Anyiah Coverdale 1/1/20249:43 AM

## 2022-01-06 NOTE — Progress Notes (Signed)
  Progress Note   Patient: Robin Rogers EUM:[id number] DOB: 02/23/1940 DOA: 01/02/2022     1 DOS: the patient was seen and examined on 01/06/2022   Brief hospital course: PINKIE MANGER is a 82 y.o. 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 She was sent to the ED from outpatient hemodialysis after being found hypoxic with spO2 in 80's.   Pertinent ED findings --- BNP elevated 3655.5, labs with K 2.7, Cr 1.84, BUN 5, hs-troponin 43. No acute EKG changes.  CBC with low platelets 117k.  CTA abdomen/pelvis with and without contrast was without acute findings, showed signs of anasarca with subcutaneous edema in bilateral flanks, unchanged moderate pericardial effusion, contrast reflux into hepatic veins suggestive of right heart dysfunction, mild bladder wall thickening.   Admitted to hospital with nephrology consulted for acute on chronic HFpEF with volume overload.    Assessment and Plan: * Fluid overload Acute on chronic combined systolic and diastolic CHF in the setting of ESRD on hemodialysis. -- Dialysis per Nephrology  -- 2 L removed with HD yesterday  Generalized weakness PT/OT evaluations SNF/rehab recommended Fall precautions  ESRD on hemodialysis [facility]) Nephrology following for dialysis.  Hypokalemia Potassium was replaced on admission for K 2.7.  Resolved.  K this AM 3.9. Monitor BMP and Mg level.   GERD without esophagitis Continue PPI   Type 2 diabetes mellitus with chronic kidney disease, without long-term current use of insulin (HCC) Sliding scale NovoLog  Dyslipidemia Continue statin    Essential hypertension Continue home antihypertensives.        Subjective: Pt seen in the ED with family member at bedside. She is sleeping again, but more easily awoken.  Denies pain or difficulty breathing.  Feels a little better.    Physical Exam: Vitals:   01/06/22 0630 01/06/22 0640 01/06/22 1358 01/06/22 1600  BP:  (!) 117/58 122/65   Pulse: 93  95   Resp:   17 17  Temp:   98.7 F (37.1 C)   TempSrc:   Oral   SpO2: 100%  98%   Weight:      Height:       General exam: appears sleeping comfortably, more responsive today, no acute distress HEENT: keeps eyes closed, moist mucus membranes, hearing grossly normal  Respiratory system: CTAB but diminished bases, no wheezes, normal respiratory effort. Cardiovascular system: normal S1/S2, RRR, no pedal edema.   Gastrointestinal system: soft, NT, ND. Central nervous system: exam limited by patient somnolence and not following commands Skin: dry, intact, normal temperature   Data Reviewed:  Notable labs --- Cr 2.16, Ca 7.8, BUN 6, WBC 2.7, Hbg 10.1 stable, platelets 104 from 114k    Family Communication: at bedside on rounds  Disposition: Status is: Inpatient Remains inpatient appropriate because: Ongoing encephalopathy, remains volume overloaded getting hemodialysis for volume management.  SNF/rehab recommended, will need placement unless pt declines it.   Planned Discharge Destination: Home    Time spent: 35 minutes  Author: Ezekiel Slocumb, DO 01/06/2022 5:10 PM  For on call review [email or web address].

## 2022-01-06 NOTE — Evaluation (Signed)
Occupational Therapy Evaluation Patient Details Name: Robin Rogers MRN: [id number] DOB: 04/12/1940 Today's Date: 01/06/2022   History of Present Illness Robin Rogers is a 82 y.o. 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 She was sent to the ED from outpatient hemodialysis after being found hypoxic with spO2 in 80's. Pt was Admitted to hospital with nephrology consulted for acute on chronic HFpEF with volume overload.   Clinical Impression   Robin Rogers was seen for OT evaluation this date. [employer] interpreter services utilized t/o session (Interpreter ID# [phone]). Prior to hospital admission, pt was living at home with her adult children providing 24/7 care. Per pt/son at bedside pt is ambulatory with a RW, she is able to ambulate short household distances and can perform toileting/toilet transfers with MOD I. Per son family assists with dressing, bathing and IADL Management. Pt presents to acute OT demonstrating impaired ADL performance and functional mobility 2/2 generalized weakness, decreased activity tolerance, and increased abdominal pain with mobility (See OT problem list). Pt currently requires MAX A for bed mobility this date. Anticipate MAX A for bathing and dressing at bed level with +2 for mobility attempts.  Pt would benefit from skilled OT services to address noted impairments and functional limitations (see below for any additional details) in order to maximize safety and independence while minimizing falls risk and caregiver burden. Upon hospital discharge, recommend STR to maximize pt safety and return to PLOF.         Recommendations for follow up therapy are one component of a multi-disciplinary discharge planning process, led by the attending physician.  Recommendations may be updated based on patient status, additional functional criteria and insurance authorization.   Follow Up Recommendations  Skilled nursing-short term rehab (<3 hours/day)     Assistance Recommended at Discharge Frequent or constant Supervision/Assistance  Patient can return home with the following A lot of help with bathing/dressing/bathroom;A lot of help with walking and/or transfers;Help with stairs or ramp for entrance;Assist for transportation;Assistance with cooking/housework;Assistance with feeding    Functional Status Assessment  Patient has had a recent decline in their functional status and demonstrates the ability to make significant improvements in function in a reasonable and predictable amount of time.  Equipment Recommendations  None recommended by OT    Recommendations for Other Services       Precautions / Restrictions Precautions Precautions: Fall Restrictions Weight Bearing Restrictions: No      Mobility Bed Mobility Overal bed mobility: Needs Assistance Bed Mobility: Rolling Rolling: Max assist         General bed mobility comments: Max A to adjust positioning in bed. Per RN pt has required +2 assist for bed mobility in ED.    Transfers                          Balance Overall balance assessment: Needs assistance                                         ADL either performed or assessed with clinical judgement  ADL Overall ADL's : Needs assistance/impaired                                       General ADL Comments: Pt requires MAX A for bed mobility, +2 MAX A for functional mobility/transfers, Anticipate MAX A bed-level for LB ADL management. She is unable to attempt further functional activity during session 2/2 significant pain and  fatigue. RN notified.     Vision Baseline Vision/History: 2 Legally blind Ability to See in Adequate Light: 4 Severely impaired Patient Visual Report: No change from baseline       Perception     Praxis      Pertinent Vitals/Pain Pain Assessment Pain Assessment: 0-10 Pain Score: 10-Worst pain ever Pain Location: Abdominal pain Pain Descriptors / Indicators: Discomfort, Grimacing, Sore Pain Intervention(s): Limited activity within patient's tolerance, Monitored during session, Repositioned, Patient requesting pain meds-RN notified     Hand Dominance Right   Extremity/Trunk Assessment Upper Extremity Assessment Upper Extremity Assessment: Generalized weakness   Lower Extremity Assessment Lower Extremity Assessment: Generalized weakness       Communication Communication Communication: Interpreter utilized ([employer] Interp #[id number], per interpreter pt dialect limited communication. Son also translates during session.)   Cognition Arousal/Alertness: Lethargic Behavior During Therapy: Flat affect Overall Cognitive Status: Difficult to assess                                 General Comments: Pt requires increased time/cueing to follow VCs per interpreter pt used indigenous dialect of Spanish that made translation difficult. Per son, pt in sigificant pain during session.     General Comments       Exercises Other Exercises Other Exercises: Pt/son educated on role of OT in acute setting, DC recs, & importance of functional activity in hospital setting.   Shoulder Instructions      Home Living Family/patient expects to be discharged to:: Private residence Living Arrangements: Children Available Help at Discharge: Family;Available 24 hours/day Type of Home: House Home Access: Ramped entrance     Home Layout: One level     Bathroom Shower/Tub: Teacher, early years/pre: Standard     Home Equipment: Conservation officer, nature (2 wheels);Shower  seat;Wheelchair - manual;BSC/3in1   Additional Comments: Per son at bedside, pt always have one family member with her.      Prior Functioning/Environment Prior Level of Function : Needs assist       Physical Assist : Mobility (physical);ADLs (physical) Mobility (physical): Bed mobility;Transfers ADLs (physical): Bathing;Dressing;IADLs Mobility Comments: Able to perform short transfers using RW per son. ADLs Comments: Able to feed herself and perform grooming. Requires assist from family for bathing, dressing, IADLs at baseline.        OT Problem List: Decreased strength;Decreased coordination;Decreased activity tolerance;Decreased safety awareness;Impaired balance (sitting and/or standing);Decreased knowledge of use of DME or AE;Cardiopulmonary status limiting activity;Impaired UE functional use;Impaired vision/perception      OT Treatment/Interventions: Self-care/ADL training;Therapeutic exercise;Therapeutic activities;DME and/or AE instruction;Patient/family education;Balance training;Energy conservation    OT Goals(Current goals can be found in the care plan section) Acute Rehab OT Goals Patient Stated Goal: To have less pain OT Goal Formulation: With patient/family Time For Goal Achievement: 01/20/22 Potential to Achieve Goals: Good ADL Goals Pt Will Perform Grooming: sitting;with set-up;with supervision Pt Will Transfer to Toilet: bedside commode;stand pivot transfer;ambulating;with min assist Pt Will  Perform Toileting - Clothing Manipulation and hygiene: sit to/from stand;with min assist;with adaptive equipment;with caregiver independent in assisting  OT Frequency: Min 2X/week    Co-evaluation              AM-PAC OT "6 Clicks" Daily Activity     Outcome Measure Help from another person eating meals?: A Little Help from another person taking care of personal grooming?: A Lot Help from another person toileting, which includes using toliet, bedpan, or urinal?: A  Lot Help from another person bathing (including washing, rinsing, drying)?: A Lot Help from another person to put on and taking off regular upper body clothing?: A Lot Help from another person to put on and taking off regular lower body clothing?: A Lot 6 Click Score: 13   End of Session    Activity Tolerance: Patient limited by fatigue;Patient limited by pain Patient left: in bed;with call bell/phone within reach;with bed alarm set;with family/visitor present  OT Visit Diagnosis: Other abnormalities of gait and mobility (R26.89)                Time: 3672-5500 OT Time Calculation (min): 23 min Charges:  OT General Charges $OT Visit: 1 Visit OT Evaluation $OT Eval Moderate Complexity: 1 Mod  Shara Blazing, M.S., OTR/L 01/06/22, 1:41 PM

## 2022-01-06 DEATH — deceased

## 2022-01-07 ENCOUNTER — Encounter: Payer: Self-pay | Admitting: Family Medicine

## 2022-01-07 LAB — CBC
HCT: 35.6 % — ABNORMAL LOW (ref 36.0–46.0)
Hemoglobin: 10.7 g/dL — ABNORMAL LOW (ref 12.0–15.0)
MCH: 35 pg — ABNORMAL HIGH (ref 26.0–34.0)
MCHC: 30.1 g/dL (ref 30.0–36.0)
MCV: 116.3 fL — ABNORMAL HIGH (ref 80.0–100.0)
Platelets: 111 10*3/uL — ABNORMAL LOW (ref 150–400)
RBC: 3.06 MIL/uL — ABNORMAL LOW (ref 3.87–5.11)
RDW: 15 % (ref 11.5–15.5)
WBC: 3.4 10*3/uL — ABNORMAL LOW (ref 4.0–10.5)
nRBC: 0 % (ref 0.0–0.2)

## 2022-01-07 LAB — HEPATITIS B SURFACE ANTIBODY, QUANTITATIVE: Hep B S AB Quant (Post): <3.1 m[IU]/mL — ABNORMAL LOW (ref >9.9)

## 2022-01-07 LAB — HEPATITIS B E ANTIBODY: Hep B E Ab: NEGATIVE

## 2022-01-07 LAB — BASIC METABOLIC PANEL
Anion gap: 7 (ref 5–15)
BUN: 13 mg/dL (ref 8–23)
CO2: 28 mmol/L (ref 22–32)
Calcium: 8.2 mg/dL — ABNORMAL LOW (ref 8.9–10.3)
Chloride: 99 mmol/L (ref 98–111)
Creatinine, Ser: 3.49 mg/dL — ABNORMAL HIGH (ref 0.44–1.00)
GFR, Estimated: 13 mL/min — ABNORMAL LOW (ref >60)
Glucose, Bld: 84 mg/dL (ref 70–99)
Potassium: 4.8 mmol/L (ref 3.5–5.1)
Sodium: 134 mmol/L — ABNORMAL LOW (ref 135–145)

## 2022-01-07 LAB — MAGNESIUM: Magnesium: 2 mg/dL (ref 1.7–2.4)

## 2022-01-07 LAB — GLUCOSE, CAPILLARY: Glucose-Capillary: 91 mg/dL (ref 70–99)

## 2022-02-06 NOTE — Significant Event (Signed)
Responded to rapid response page to dialysis. On arrival to dialysis, found patient to have agonal, sporadic respirations. CBG checked by dialysis staff. Pt continued with pulse, pt became apneic.Transported back to room 249. Was informed staff had contacted pt daughter to return to hospital. Notified patient RN prior to returning patient to her room.

## 2022-02-06 NOTE — Progress Notes (Signed)
PT Cancellation Note  Patient Details Name: Robin Rogers MRN: [id number] DOB: 03/03/1940   Cancelled Treatment:    Reason Eval/Treat Not Completed: Medical issues which prohibited therapy Pt went to HD this AM, rapid called.  Now with [facility] will hold PT this date and follow from a distance regarding appropriateness to participate with PT.   Kreg Shropshire, DPT 02/04/22, 9:47 AM

## 2022-02-06 NOTE — Progress Notes (Signed)
   January 17, 2022 0800  Clinical Encounter Type  Visited With Patient;Health care provider  Visit Type Code   Chaplain responded to Rapid Response Code in [facility]. No family present. Will refer case to Day Chaplain for follow-up.

## 2022-02-06 NOTE — Progress Notes (Signed)
0816: Patient came in to HD unit with transport. Upon assessment, patient is gasping, pulse is weak, and patient looks pale and is cold to touch. Hooked patient to cardiac monitor and called RRT. NP was there and floor RN notified.  0818: RRT came to assess patient. Patient is on DNR status. Blood pressure: 92/75 and Is still gasping. Blood glucose: 91 mg/dl. Continued to monitor patient.  1364: Blood pressure: 59/28, HR: 78, still gasping. Monitoring continued.  0830: HR: 36. RRT and floor RN transported the patient back to her room.

## 2022-02-06 NOTE — Significant Event (Addendum)
Pt's family arrived. Were very upset pt had died. Stated pt was not wearing oxygen when transported to dialysis, demanding to see cameras from the elevator and the dialysis unit. Linard Millers, AD, Waldron Session DD, security officer speaking with family. I spoke with family and explained we did not have cameras on the elevators. They stated that was not true and wanted to speak with someone in Security. Security up to speak with family. Dr. Nicole Kindred spoke with family. Interpreter offered and declined. Family insistent patient did not have oxygen, however, she did have oxygen for transport to dialysis. Dr. Claiborne Billings explained in depth how the patient may have died, stating could possibly been a heart attack or a blood clot. Family continued to insist it was because pt was not on oxygen. One family member stated she had seen her without oxygen in dialysis, but no family had been present while patient was in dialysis. Dr. Arbutus Ped explained to family multiple times the patient had been on oxygen and her vital signs had been stable prior to the incident. Dr. Arbutus Ped had mentioned the possibility of the patient having an autopsy, but the family did not want to do that.

## 2022-02-06 NOTE — Progress Notes (Addendum)
[facility]  ROUNDING NOTE   Subjective:   Robin Rogers is a 82 year old female with past medical conditions consistent with type 2 diabetes, combined systolic and diastolic heart failure, diabetic nephropathy/blindness, hypertension, and end-stage renal disease on hemodialysis.  Patient presents to the emergency department with abnormal labs and shortness of breath.  Patient has been admitted under observation for Hypokalemia [E87.6] Hypoxia [R09.02] Fluid overload [E87.70] Congestive heart failure, unspecified HF chronicity, unspecified heart failure type [facility]) [I50.9]  Patient is known to our practice and receives outpatient dialysis treatments at [facility] on a TTS schedule, supervised by Dr. Candiss Norse.    Patient presented to dialysis for routine treatment today.  The patient arrived to unit unresponsive, agonal breathing.  Nasal cannula in place attached to portable oxygen tank.  Rapid response team activated.  Supplemental oxygen increased and blood sugar check.  Patient found to be bradycardic on monitor, 30s to 40s, and hypotensive, 59/28.  Deemed unstable for treatment and patient returned to assigned room.   Objective:  Vital signs in last 24 hours:  Temp:  [97.7 F (36.5 C)-98.2 F (36.8 C)] 98.1 F (36.7 C) 12-Jan-2022 0743) Pulse Rate:  [36-98] 36 01/12/2022 0830) Resp:  [17-21] 18 2022-01-12 0743) BP: (59-130)/(28-75) 59/28 2022-01-12 0821) SpO2:  [98 %-100 %] 98 % 2022-01-12 0743) Weight:  [73.5 kg] 73.5 kg 01/12/2022 0847)  Weight change: 0.9 kg Filed Weights   01/05/22 1240 01/06/22 1715 January 12, 2022 0847  Weight: 72.6 kg 73.5 kg 73.5 kg    Intake/Output: No intake/output data recorded.   Intake/Output this shift:  No intake/output data recorded.  Physical Exam: General: Ill appearing  Head: Normocephalic, atraumatic.   Eyes: Anicteric  Lungs:  NCO2  Heart: Bradycardic   Abdomen:  Soft, nontender, nondistended  Extremities: No peripheral edema.  Neurologic:  Unresponsive to name or pain  Skin: No lesions  Access: Left chest CVC    Basic Metabolic Panel: Recent Labs  Lab 12/16/2021 1342 01/05/22 0534 01/06/22 0628 2022/01/12 0556  NA 135 137 135 134*  K 2.7* 3.9 3.7 4.8  CL 98 103 99 99  CO2 27 29 31 28   GLUCOSE 98 86 92 84  BUN 5* 9 6* 13  CREATININE 1.84* 2.63* 2.16* 3.49*  CALCIUM 8.3* 8.3* 7.8* 8.2*  MG 1.8  --  1.7 2.0     Liver Function Tests: Recent Labs  Lab 12/13/2021 1342  AST 21  ALT 16  ALKPHOS 109  BILITOT 1.3*  PROT 6.6  ALBUMIN 3.2*    Recent Labs  Lab 01/01/2022 1342  LIPASE 28    No results for input(s): "AMMONIA" in the last 168 hours.  CBC: Recent Labs  Lab 12/23/2021 1342 01/05/22 0535 01/06/22 0628 12-Jan-2022 0556  WBC 3.1* 2.6* 2.7* 3.4*  HGB 11.7* 10.8* 10.1* 10.7*  HCT 37.7 35.4* 33.4* 35.6*  MCV 108.6* 112.0* 113.6* 116.3*  PLT 117* 114* 104* 111*     Cardiac Enzymes: No results for input(s): "CKTOTAL", "CKMB", "CKMBINDEX", "TROPONINI" in the last 168 hours.  BNP: Invalid input(s): "POCBNP"  CBG: Recent Labs  Lab 01/06/22 1716 01/06/22 2049 01-12-22 0825  GLUCAP 88 92 91    Microbiology: Results for orders placed or performed during the hospital encounter of 09/21/20  Resp Panel by RT-PCR (Flu A&B, Covid) Nasopharyngeal Swab     Status: None   Collection Time: 09/21/20  1:11 PM   Specimen: Nasopharyngeal Swab; Nasopharyngeal(NP) swabs in vial transport medium  Result Value Ref Range Status  SARS Coronavirus 2 by RT PCR NEGATIVE NEGATIVE Final    Comment: (NOTE) SARS-CoV-2 target nucleic acids are NOT DETECTED.  The SARS-CoV-2 RNA is generally detectable in upper respiratory specimens during the acute phase of infection. The lowest concentration of SARS-CoV-2 viral copies this assay can detect is 138 copies/mL. A negative result does not preclude SARS-Cov-2 infection and should not be used as the sole basis for treatment or other patient management decisions. A negative  result may occur with  improper specimen collection/handling, submission of specimen other than nasopharyngeal swab, presence of viral mutation(s) within the areas targeted by this assay, and inadequate number of viral copies(<138 copies/mL). A negative result must be combined with clinical observations, patient history, and epidemiological information. The expected result is Negative.  Fact Sheet for Patients:  EntrepreneurPulse.com.au  Fact Sheet for Healthcare Providers:  IncredibleEmployment.be  This test is no t yet approved or cleared by the [place] FDA and  has been authorized for detection and/or diagnosis of SARS-CoV-2 by FDA under an Emergency Use Authorization (EUA). This EUA will remain  in effect (meaning this test can be used) for the duration of the COVID-19 declaration under Section 564(b)(1) of the Act, 21 U.S.C.section 360bbb-3(b)(1), unless the authorization is terminated  or revoked sooner.       Influenza A by PCR NEGATIVE NEGATIVE Final   Influenza B by PCR NEGATIVE NEGATIVE Final    Comment: (NOTE) The Xpert Xpress SARS-CoV-2/FLU/RSV plus assay is intended as an aid in the diagnosis of influenza from Nasopharyngeal swab specimens and should not be used as a sole basis for treatment. Nasal washings and aspirates are unacceptable for Xpert Xpress SARS-CoV-2/FLU/RSV testing.  Fact Sheet for Patients: EntrepreneurPulse.com.au  Fact Sheet for Healthcare Providers: IncredibleEmployment.be  This test is not yet approved or cleared by the [place] FDA and has been authorized for detection and/or diagnosis of SARS-CoV-2 by FDA under an Emergency Use Authorization (EUA). This EUA will remain in effect (meaning this test can be used) for the duration of the COVID-19 declaration under Section 564(b)(1) of the Act, 21 U.S.C. section 360bbb-3(b)(1), unless the authorization is  terminated or revoked.  Performed at [facility], [address] [place], [place] [address]     Coagulation Studies: No results for input(s): "LABPROT", "INR" in the last 72 hours.  Urinalysis: No results for input(s): "COLORURINE", "LABSPEC", "PHURINE", "GLUCOSEU", "HGBUR", "BILIRUBINUR", "KETONESUR", "PROTEINUR", "UROBILINOGEN", "NITRITE", "LEUKOCYTESUR" in the last 72 hours.  Invalid input(s): "APPERANCEUR"    Imaging: No results found.   Medications:      aspirin EC  81 mg Oral Daily   carvedilol  6.25 mg Oral BID WC   Chlorhexidine Gluconate Cloth  6 each Topical Q0600   docusate sodium  100 mg Oral BID   enoxaparin (LOVENOX) injection  30 mg Subcutaneous Q24H   pantoprazole  40 mg Oral Daily   polyethylene glycol  17 g Oral BID   potassium chloride  40 mEq Oral BID   pravastatin  40 mg Oral q1800   acetaminophen **OR** acetaminophen, labetalol, magnesium hydroxide, ondansetron **OR** ondansetron (ZOFRAN) IV, traZODone  Assessment/ Plan:  Ms. Robin Rogers is a 82 y.o.  female with past medical conditions consistent with type 2 diabetes, combined systolic and diastolic heart failure, diabetic nephropathy/blindness, hypertension, and end-stage renal disease on hemodialysis.  Patient presents to the emergency department with abnormal labs and shortness of breath.  Patient has been admitted under observation for Hypokalemia [E87.6] Hypoxia [R09.02] Fluid overload [E87.70]  Congestive heart failure, unspecified HF chronicity, unspecified heart failure type ([place]) [I50.9]  [facility]/TTS/left chest PermCath  Fluid volume overload with end-stage renal disease on hemodialysis.  Chest x-ray shows some pulmonary edema.  Patient scheduled to receive routine dialysis today however treatment aborted due to clinical presentation and hypotension.  2.  Hypokalemia,Potassium 2.7 on ED arrival.  Corrected with dialysis.  3. Anemia of chronic kidney  disease Lab Results  Component Value Date   HGB 10.7 (L) 2022-01-12    Patient receives Mircera at outpatient clinic.    4. Secondary Hyperparathyroidism: with outpatient labs: PTH 338, phosphorus 4.9, calcium 8.2 on 07/16/21.   Lab Results  Component Value Date   PTH 200 (H) 05/15/2019   PTH Comment 05/15/2019   CALCIUM 8.2 (L) 01-12-2022   CAION 1.10 (L) 04/06/2020   PHOS 3.8 07/26/2019    Ergocalciferol ordered outpatient.  Calcium acceptable at this time.  5.  Hypertension with chronic kidney disease.  Home regimen includes carvedilol and hydralazine.  Currently prescribed carvedilol.  Furosemide held due to soft blood pressures..  Blood pressure 130/48 prior to dialysis arrival.   LOS: 2 [place] 01/07/242:09 PM

## 2022-02-06 NOTE — Significant Event (Signed)
Rapid Response Event Note   Reason for Call :  Pt unresponsive  Initial Focused Assessment:   Per Benancio Deeds- Nephrology NP- patient was brought to dialysis unit- on 4L oxygen and patient was not breathing- patient was blue in color.  NP stated that patient was a DNR.    Interventions:  Performed sternal rub- patient unresponsive BP- 93'J systolic and HR 39- dialysis RN notified Dr. Arbutus Ped and bedside RN Arlee Muslim came to bedside.    Plan of Care:  [facility] Kennyth Lose stated to bring patient back to her room 249- so that patient's daughter and family could be with her at the bedside.     Event Summary:  Patient ended up passing away at some time when she returned to her room per charge RN Janett Billow.  MD Notified:  Call Time: Arrival Time: End Time:  Reggie Pile, RN

## 2022-02-06 NOTE — Progress Notes (Signed)
Pt. Aox4, 2LNC, used bedpan, oliguric, smear bowel movement, scheduled for H/D. Resting in bed, in no distress. Daughter at bedside.

## 2022-02-06 NOTE — Death Summary Note (Addendum)
DEATH SUMMARY   Patient Details  Name: Robin Rogers MRN: [id number] DOB: 1940/05/29 [id number], [place] Admission/Discharge Information   Admit Date:  01-14-22  Date of Death: Date of Death: 17-Jan-2022  Time of Death: Time of Death: 0842  Length of Stay: 2   Principle Cause of death: Sudden cardiac arrest, circulatory collapse of unknown etiology  Hospital Diagnoses: Principal Problem:   Fluid overload Active Problems:   Generalized weakness   ESRD on hemodialysis ([place])   Essential hypertension   Dyslipidemia   Type 2 diabetes mellitus with chronic kidney disease, without long-term current use of insulin ([place])   GERD without esophagitis Acute metabolic encephalopathy  Hospital Course: Robin Rogers is a 82 y.o. 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 She was sent to the ED from outpatient hemodialysis after being found hypoxic with spO2 in 80's.   Pertinent ED findings --- BNP elevated 3655.5, labs with K 2.7, Cr 1.84, BUN 5, hs-troponin 43. No acute EKG changes.  CBC with low platelets 117k.  CTA abdomen/pelvis with and without contrast was without acute findings, showed signs of anasarca with subcutaneous edema in bilateral flanks, unchanged moderate pericardial effusion, contrast reflux into hepatic veins suggestive of right heart dysfunction, mild bladder wall thickening.   Admitted to hospital with nephrology consulted for acute on chronic HFpEF with volume overload.     Further hospital course and management as outlined below.  Jan 17, 2022 - this AM, patient was being  taken down to dialysis, and while en route to dialysis unit, she became suddenly short of breath and was described as "gasping".  Her BP was checked and found to be 59/28.  Patient was taken back to room and while rapid response team was assessing, patient was noted to be pulseless, apneic and unresponsive.  Due to DNR/DNI status, no code was called.  Time of death was called at 0842 this AM.     Assessment and Plan: * Fluid overload Acute on chronic combined systolic and diastolic CHF in the setting of ESRD on hemodialysis. -- Dialysis per Nephrology  -- 2 L removed with HD 12/31 and pt clinically improved  Generalized weakness PT/OT evaluations SNF/rehab recommended Fall precautions  ESRD on hemodialysis [facility]) Nephrology following for dialysis.  Hypokalemia-resolved as of 2022/01/17 Potassium was replaced on admission for K 2.7.  Resolved.  K this AM 3.9. Monitor BMP and Mg level.   GERD without esophagitis Continue PPI   Type 2 diabetes mellitus with chronic kidney disease, without long-term current use of insulin ([facility]) Sliding scale NovoLog  Dyslipidemia Continue statin   Essential hypertension Continue home antihypertensives.         Procedures: Dialysis  Consultations: Nephrology  The results of significant diagnostics from this hospitalization (including imaging, microbiology, ancillary and laboratory) are listed below for reference.   Significant Diagnostic Studies: CT Angio Abd/Pel w/ and/or w/o  Result Date: Jan 14, 2022 CLINICAL DATA:  Abdominal pain, acute, nonlocalized Upper abdominal pain. Patient's constipation has been treated and she is stooling well. Daughter is worried because her skin over her abdomen is also changing color. EXAM: CTA ABDOMEN AND PELVIS WITHOUT AND WITH CONTRAST TECHNIQUE: Multidetector CT  imaging of the abdomen and pelvis was performed using the standard protocol during bolus administration of intravenous contrast. Multiplanar  reconstructed images and MIPs were obtained and reviewed to evaluate the vascular anatomy. RADIATION DOSE REDUCTION: This exam was performed according to the departmental dose-optimization program which includes automated exposure control, adjustment of the mA and/or kV according to patient size and/or use of iterative reconstruction technique. CONTRAST:  60mL OMNIPAQUE IOHEXOL 350 MG/ML SOLN COMPARISON:  Multiple priors including CT abdomen pelvis December 01, 2021. FINDINGS: VASCULAR Aorta: Aortic atherosclerosis. Normal caliber aorta without aneurysm, dissection, vasculitis or significant stenosis. Celiac: Patent without evidence of aneurysm, dissection, vasculitis or significant stenosis. SMA: Patent without evidence of aneurysm, dissection, vasculitis or significant stenosis. Renals: Both renal arteries are patent without evidence of aneurysm, dissection, vasculitis, fibromuscular dysplasia or significant stenosis. IMA: Patent without evidence of aneurysm, dissection, vasculitis or significant stenosis. Inflow: Patent without evidence of aneurysm, dissection, vasculitis or significant stenosis. Proximal Outflow: Bilateral common femoral and visualized portions of the superficial and profunda femoral arteries are patent without evidence of aneurysm, dissection, vasculitis or significant stenosis. Veins: No obvious venous abnormality within the limitations of this arterial phase study. Review of the MIP images confirms the above findings. NON-VASCULAR Lower chest: Four-chamber cardiomegaly with moderate pericardial effusion unchanged. Right pleural calcifications. Bibasilar atelectasis. Hepatobiliary: Reflux of contrast from the IVC into the hepatic veins. Small hypodensity in the central left lobe of the liver measuring 9 mm on image 29/2 is stable from prior examinations and likely reflects a cyst or hemangioma. Gallbladder surgically absent. No biliary ductal dilation. Pancreas: No pancreatic ductal dilation  or evidence of acute inflammation. Spleen: No splenomegaly or focal splenic lesion. Adrenals/Urinary Tract: Bilateral adrenal glands appear normal. No hydronephrosis. Bilateral cortical renal atrophy. Mild wall thickening of a nondistended urinary bladder. Stomach/Bowel: Small hiatal hernia. Stomach is minimally distended limiting evaluation. No pathologic dilation of small or large bowel. The appendix and terminal ileum appear normal. No evidence of acute bowel inflammation. Lymphatic: No pathologically enlarged abdominal or pelvic lymph nodes. Reproductive: Uterus and bilateral adnexa are unremarkable. Other: Trace pelvic free fluid. Similar diffuse subcutaneous edema with fluid layering in the bilateral flanks for instance on image 65/5. No drainable fluid collection. Musculoskeletal: No acute osseous abnormality. Multilevel degenerative change of the spine. IMPRESSION: 1. Similar diffuse subcutaneous edema with fluid layering in the bilateral flanks. No drainable fluid collection. 2. No acute vascular abnormality within the abdomen or pelvis. 3. Four-chamber cardiomegaly with moderate pericardial effusion unchanged. 4. Reflux of contrast from the IVC into the hepatic veins, suggestive of right heart dysfunction. 5. Mild wall thickening of a nondistended urinary bladder, which may reflect cystitis. Correlate with urinalysis. 6. Small hiatal hernia. 7.  Aortic Atherosclerosis (ICD10-I70.0). Electronically Signed   By: Dahlia Bailiff M.D.   On: 12/06/2021 19:25   DG Chest 2 View  Result Date: 12/12/2021 CLINICAL DATA:  Shortness of breath.  Low potassium in dialysis. EXAM: CHEST - 2 VIEW COMPARISON:  Chest radiographs 09/21/2020 and 08/08/2020; CT chest 08/28/2020 FINDINGS: Left internal jugular dual-lumen central venous catheter tips overlie the mid inferior right atrium, similar to prior. Cardiac silhouette is markedly enlarged, unchanged. Moderate calcification within the aortic arch. Mildly decreased lung  volumes. There are unchanged chronic calcific densities overlying the inferolateral right lung, seen to represent calcified pleural plaques on 08/28/2020 CT. Moderate bilateral interstitial thickening is unchanged from prior and chronic. Small posterior costophrenic angle blunting on lateral view, with possible left costophrenic angle blunting on frontal  view, although the overlying cardiac silhouette limits this evaluation. No pneumothorax. Old healed fractures of the posterior right ribs. IMPRESSION: 1. Unchanged marked cardiomegaly. 2. Moderate bilateral interstitial thickening, unchanged from prior and chronic. This may represent a combination of interstitial scarring and/or pulmonary edema. 3. Small pleural effusion, favored to be on the left side. Electronically Signed   By: Yvonne Kendall M.D.   On: 12/17/2021 18:28    Microbiology: No results found for this or any previous visit (from the past 240 hour(s)).  Time spent: 45 minutes including time at bedside with family   Signed: Ezekiel Slocumb, DO January 09, 2022

## 2022-02-06 DEATH — deceased

## 2022-02-19 IMAGING — CR DG CHEST 2V
2 series · 2 of 2 positions shown · non-contrast
Comparison: May 10, 2019.

CLINICAL DATA: Weakness.

EXAM:
CHEST - 2 VIEW

[chest lat]
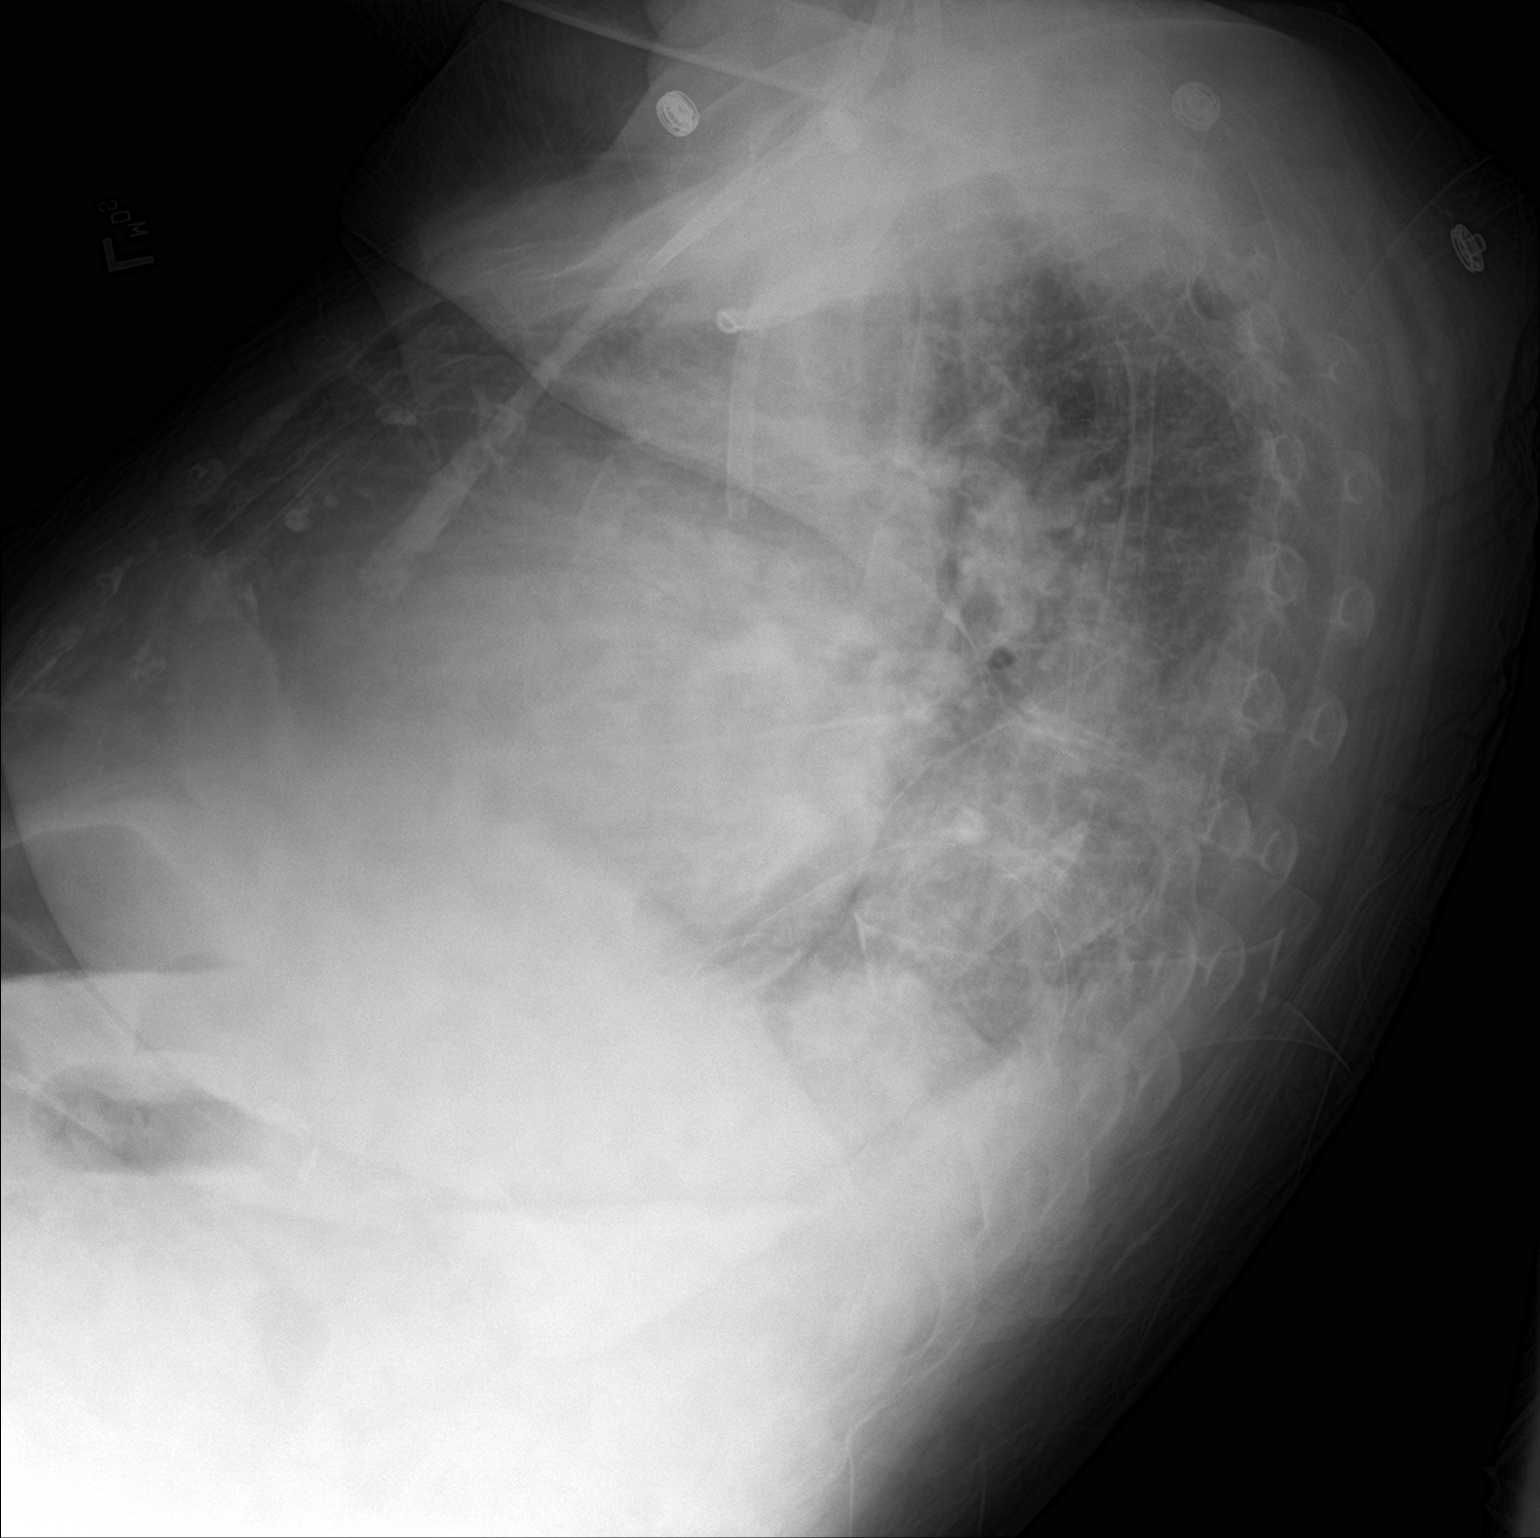

[chest ap]
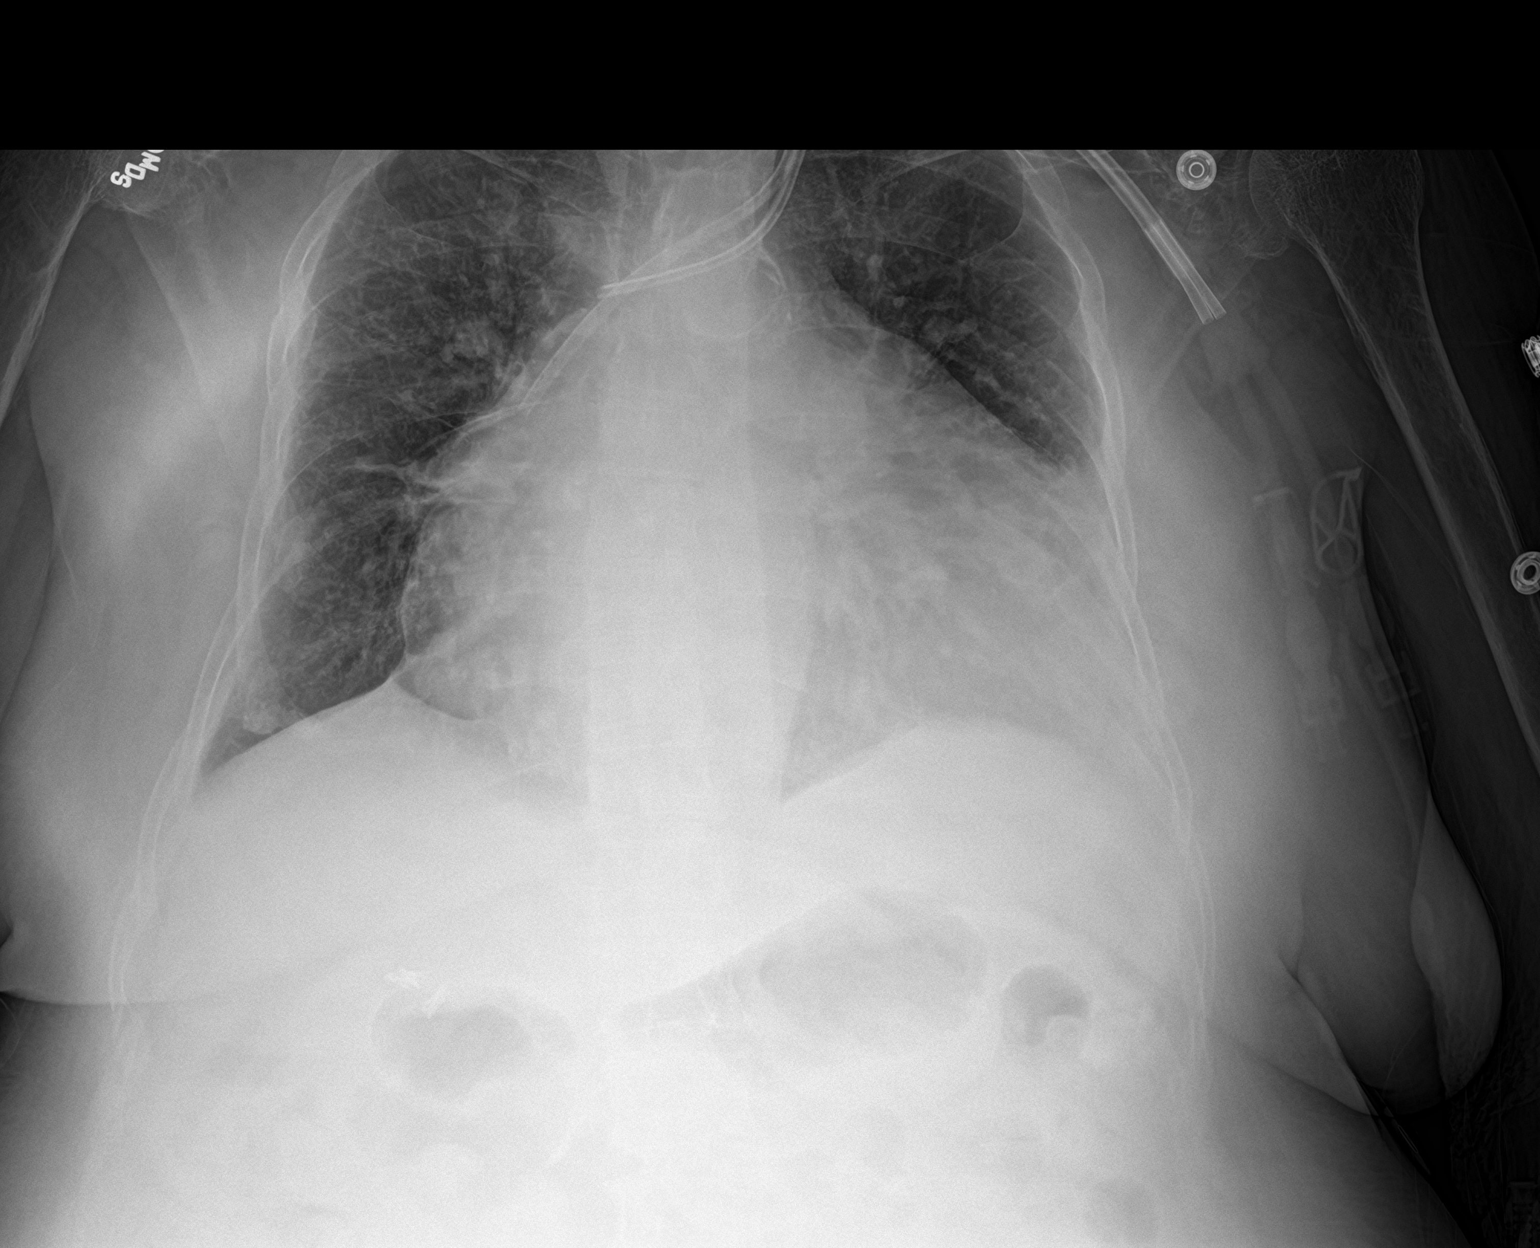

[2 of 2 positions shown; findings below may reference images not displayed]

FINDINGS: Stable cardiomegaly. Left internal jugular Port-A-Cath is noted with
distal tips in the SVC. No pneumothorax or pleural effusion is
noted. Small pleural effusions may be present. No definite
consolidative process is noted. Mild central pulmonary vascular
congestion is noted. Bony thorax is unremarkable.
IMPRESSION: Stable cardiomegaly with mild central pulmonary vascular congestion.
Small pleural effusions may be present.

Aortic Atherosclerosis ([id number].E).

## 2023-04-18 IMAGING — CT CT HEAD W/O CM
3 series · 15 of 47 positions shown, 18 images · non-contrast
Comparison: None.

CLINICAL DATA: Weakness and inability to walk for the past 3 days.
Fall this morning.

EXAM:
CT HEAD WITHOUT CONTRAST
CT CERVICAL SPINE WITHOUT CONTRAST
TECHNIQUE: Multidetector CT imaging of the head and cervical spine was
performed following the standard protocol without intravenous
contrast. Multiplanar CT image reconstructions of the cervical spine
were also generated.

[Series 2: head wo · axial · 0.43mm/px · z∈[-159,-24]mm · 9 of 33 slices shown, 12 images]
[im 3/33  brain]
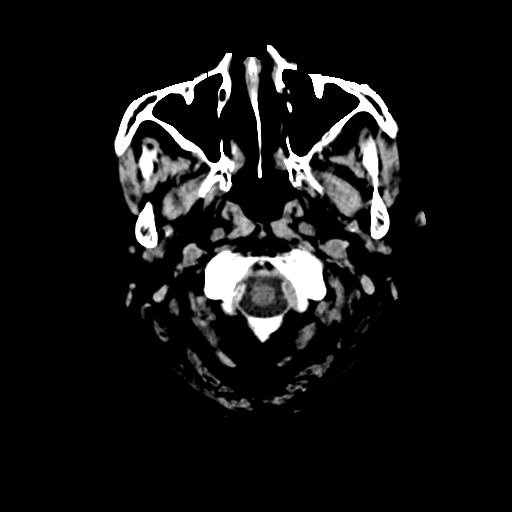
[im 3/33  bone]
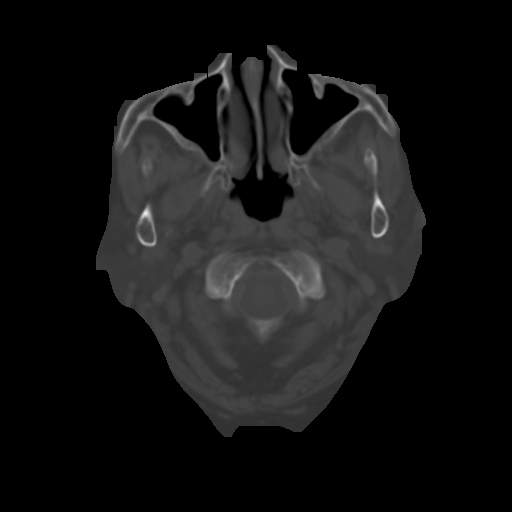
[im 6/33  brain]
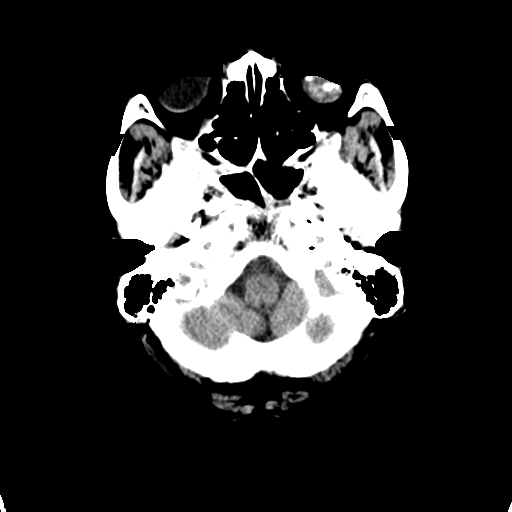
[im 9/33  brain]
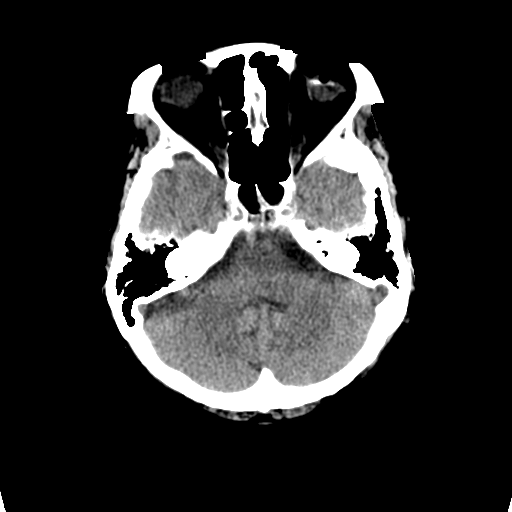
[im 13/33  brain]
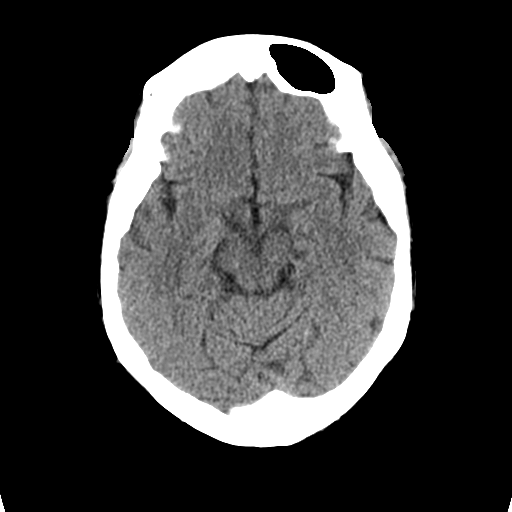
[im 17/33  brain]
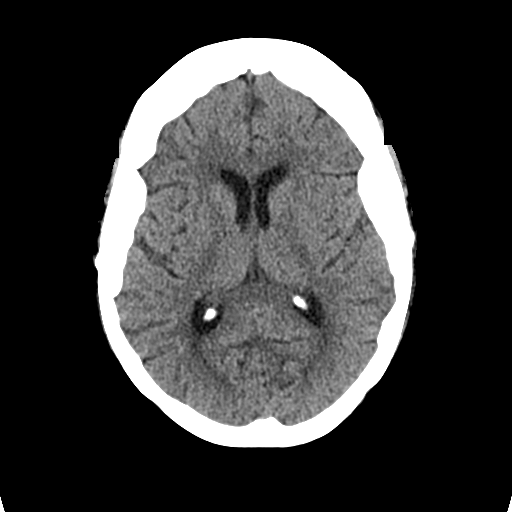
[im 17/33  bone]
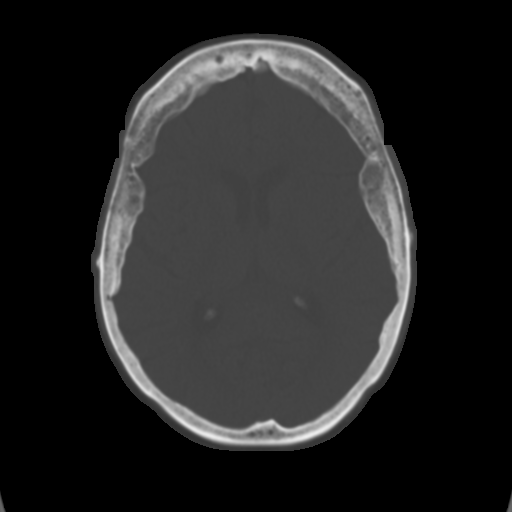
[im 20/33  brain]
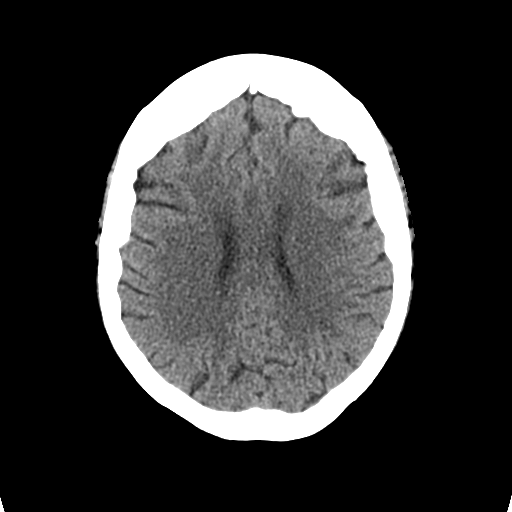
[im 24/33  brain]
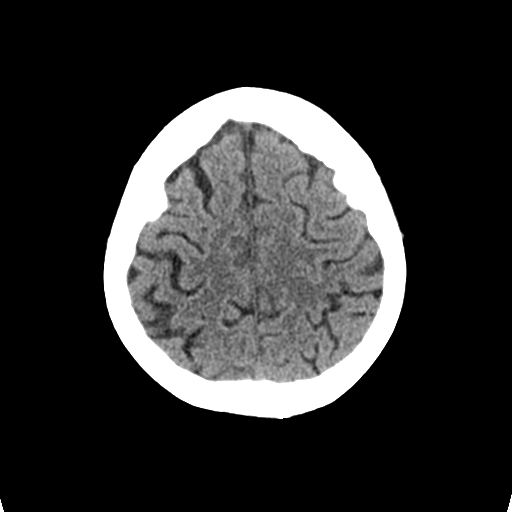
[im 27/33  brain]
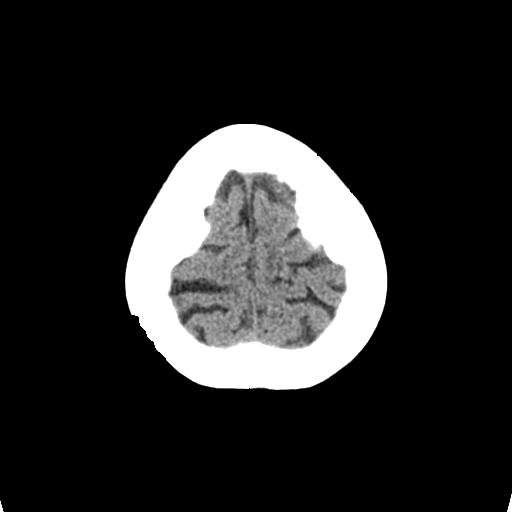
[im 30/33  brain]
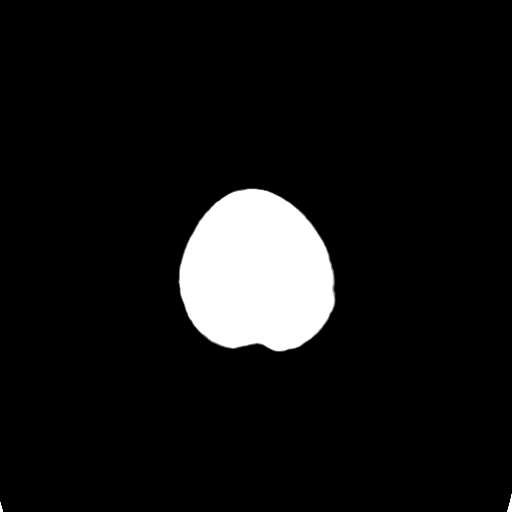
[im 30/33  bone]
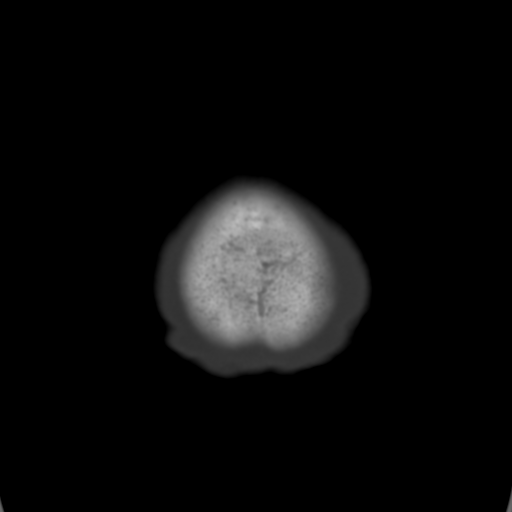

[Series 4: coronal soft tissue · coronal · 0.31mm/px · 3 of 63 slices shown]
[im 21/63  brain]
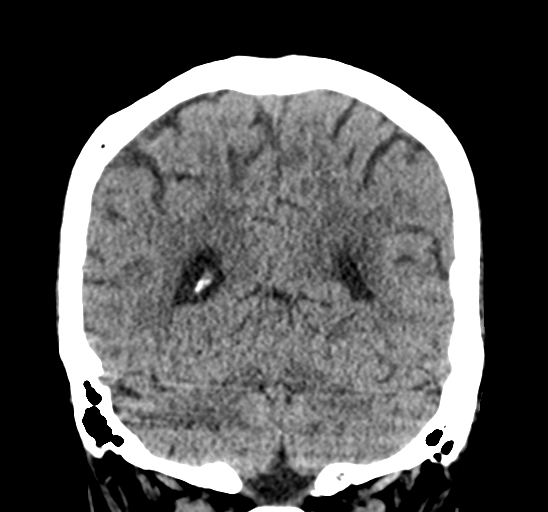
[im 28/63  brain]
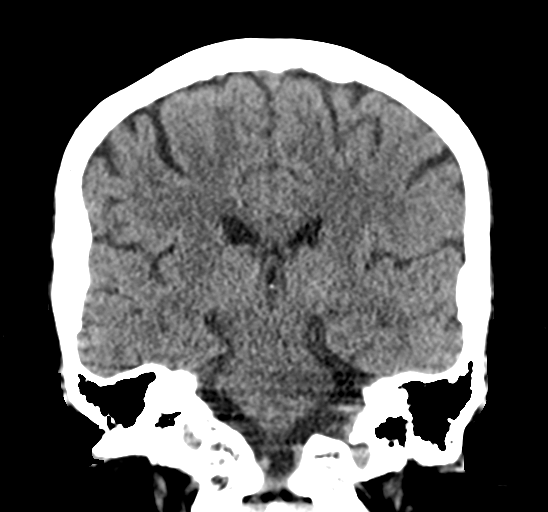
[im 35/63  brain]
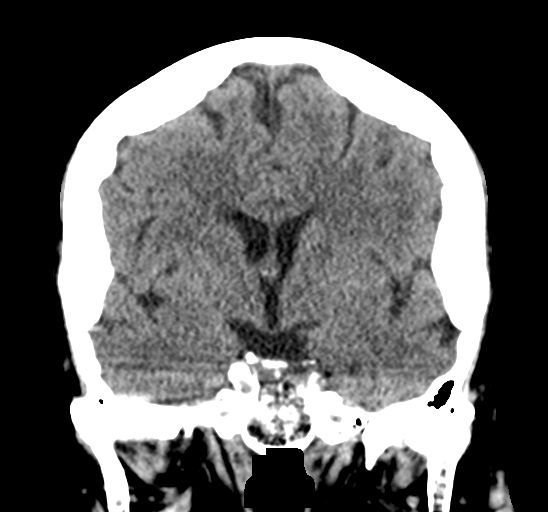

[Series 5: sagittal soft tissue · sagittal · 0.31mm/px · 3 of 57 slices shown]
[im 19/57  brain]
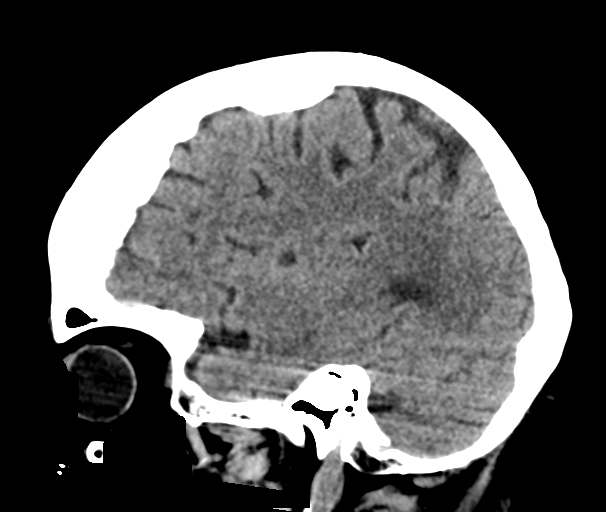
[im 29/57  brain]
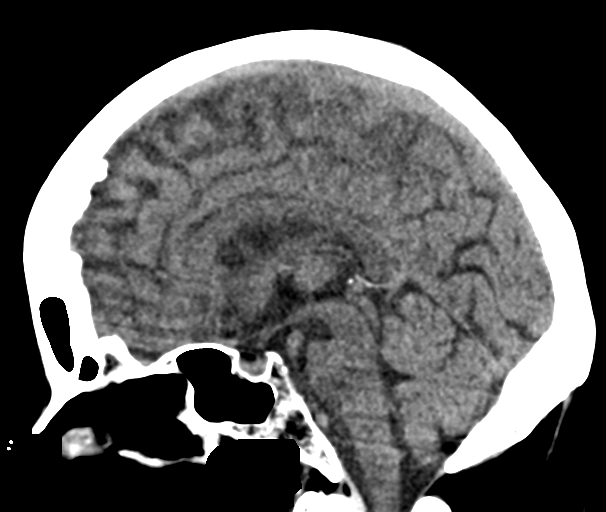
[im 38/57  brain]
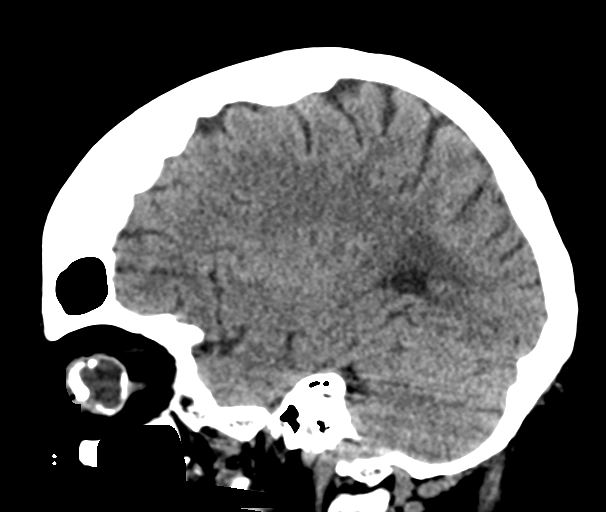

[15 of 47 positions shown; findings below may reference images not displayed]

FINDINGS: CT HEAD FINDINGS

Brain: No evidence of acute infarction, hemorrhage, hydrocephalus,
extra-axial collection or mass lesion/mass effect.

Vascular: Atherosclerotic vascular calcification of the carotid
siphons. No hyperdense vessel.

Skull: Hyperostosis.  Negative for fracture or focal lesion.

Sinuses/Orbits: No acute finding.  Left phthisis bulbi.

Other: None.

CT CERVICAL SPINE FINDINGS

Alignment: Normal.

Skull base and vertebrae: No acute fracture. No primary bone lesion
or focal pathologic process.

Soft tissues and spinal canal: No prevertebral fluid or swelling. No
visible canal hematoma.

Disc levels: Disc heights are fairly well preserved given the
patient's age. Mild bilateral uncovertebral hypertrophy at C5-C6.

Upper chest: Clear lung apices. 1.6 cm hypodense nodule in the left
thyroid lobe, unchanged since May 2013. Stability for greater than 5
years implies benignity; no biopsy or followup indicated.
Incompletely visualized tunneled left internal jugular catheter.

Other: Superficial facial subcutaneous fat stranding and small
amount of hemorrhage overlying the left mandibular ramus (series 3,
image 28).
IMPRESSION: 1. No acute intracranial abnormality.
2. No acute cervical spine fracture or traumatic listhesis.
3. Superficial facial contusion overlying the left mandibular ramus.
